# Patient Record
Sex: Male | Born: 1950 | Race: White | Hispanic: No | Marital: Married | Smoking: Current every day smoker
Health system: Southern US, Community
[De-identification: ages and names within clinical notes are randomized; demographics above are authoritative.]

## PROBLEM LIST (undated history)

## (undated) ENCOUNTER — Emergency Department (HOSPITAL_COMMUNITY): Payer: Medicare Other | Source: Home / Self Care

## (undated) DIAGNOSIS — K529 Noninfective gastroenteritis and colitis, unspecified: Secondary | ICD-10-CM

## (undated) DIAGNOSIS — F329 Major depressive disorder, single episode, unspecified: Secondary | ICD-10-CM

## (undated) DIAGNOSIS — N343 Urethral syndrome, unspecified: Secondary | ICD-10-CM

## (undated) DIAGNOSIS — N329 Bladder disorder, unspecified: Secondary | ICD-10-CM

## (undated) DIAGNOSIS — K5900 Constipation, unspecified: Secondary | ICD-10-CM

## (undated) DIAGNOSIS — M1990 Unspecified osteoarthritis, unspecified site: Secondary | ICD-10-CM

## (undated) DIAGNOSIS — F32A Depression, unspecified: Secondary | ICD-10-CM

## (undated) DIAGNOSIS — R0602 Shortness of breath: Secondary | ICD-10-CM

## (undated) DIAGNOSIS — N200 Calculus of kidney: Secondary | ICD-10-CM

## (undated) DIAGNOSIS — J449 Chronic obstructive pulmonary disease, unspecified: Secondary | ICD-10-CM

## (undated) DIAGNOSIS — K219 Gastro-esophageal reflux disease without esophagitis: Secondary | ICD-10-CM

## (undated) DIAGNOSIS — N4289 Other specified disorders of prostate: Secondary | ICD-10-CM

## (undated) DIAGNOSIS — F419 Anxiety disorder, unspecified: Secondary | ICD-10-CM

## (undated) DIAGNOSIS — C801 Malignant (primary) neoplasm, unspecified: Secondary | ICD-10-CM

## (undated) HISTORY — DX: Chronic obstructive pulmonary disease, unspecified: J44.9

## (undated) HISTORY — DX: Noninfective gastroenteritis and colitis, unspecified: K52.9

## (undated) HISTORY — DX: Constipation, unspecified: K59.00

## (undated) HISTORY — PX: CARPAL TUNNEL RELEASE: SHX101

## (undated) HISTORY — PX: BACK SURGERY: SHX140

## (undated) HISTORY — PX: OTHER SURGICAL HISTORY: SHX169

---

## 2000-03-08 ENCOUNTER — Ambulatory Visit (HOSPITAL_COMMUNITY): Admission: RE | Admit: 2000-03-08 | Discharge: 2000-03-08 | Payer: Self-pay | Admitting: Orthopedic Surgery

## 2000-03-08 ENCOUNTER — Encounter: Payer: Self-pay | Admitting: Orthopedic Surgery

## 2000-12-07 ENCOUNTER — Encounter (HOSPITAL_COMMUNITY): Admission: RE | Admit: 2000-12-07 | Discharge: 2001-01-06 | Payer: Self-pay | Admitting: Preventative Medicine

## 2000-12-23 ENCOUNTER — Encounter: Payer: Self-pay | Admitting: Family Medicine

## 2000-12-23 ENCOUNTER — Ambulatory Visit (HOSPITAL_COMMUNITY): Admission: RE | Admit: 2000-12-23 | Discharge: 2000-12-23 | Payer: Self-pay | Admitting: Family Medicine

## 2001-02-21 ENCOUNTER — Ambulatory Visit (HOSPITAL_COMMUNITY): Admission: RE | Admit: 2001-02-21 | Discharge: 2001-02-21 | Payer: Self-pay | Admitting: Internal Medicine

## 2001-03-01 ENCOUNTER — Ambulatory Visit (HOSPITAL_COMMUNITY): Admission: RE | Admit: 2001-03-01 | Discharge: 2001-03-01 | Payer: Self-pay | Admitting: Orthopaedic Surgery

## 2002-11-17 ENCOUNTER — Ambulatory Visit (HOSPITAL_COMMUNITY): Admission: RE | Admit: 2002-11-17 | Discharge: 2002-11-17 | Payer: Self-pay | Admitting: Family Medicine

## 2002-11-17 ENCOUNTER — Encounter: Payer: Self-pay | Admitting: Family Medicine

## 2002-12-15 ENCOUNTER — Ambulatory Visit (HOSPITAL_COMMUNITY): Admission: RE | Admit: 2002-12-15 | Discharge: 2002-12-15 | Payer: Self-pay | Admitting: Internal Medicine

## 2003-02-07 ENCOUNTER — Ambulatory Visit (HOSPITAL_COMMUNITY): Admission: RE | Admit: 2003-02-07 | Discharge: 2003-02-07 | Payer: Self-pay | Admitting: Internal Medicine

## 2003-10-17 ENCOUNTER — Ambulatory Visit (HOSPITAL_COMMUNITY): Admission: RE | Admit: 2003-10-17 | Discharge: 2003-10-17 | Payer: Self-pay | Admitting: Family Medicine

## 2004-02-05 ENCOUNTER — Ambulatory Visit (HOSPITAL_COMMUNITY): Admission: RE | Admit: 2004-02-05 | Discharge: 2004-02-05 | Payer: Self-pay | Admitting: Family Medicine

## 2004-03-21 ENCOUNTER — Ambulatory Visit: Payer: Self-pay | Admitting: Internal Medicine

## 2004-03-21 ENCOUNTER — Ambulatory Visit (HOSPITAL_COMMUNITY): Admission: RE | Admit: 2004-03-21 | Discharge: 2004-03-21 | Payer: Self-pay | Admitting: Internal Medicine

## 2004-05-28 ENCOUNTER — Ambulatory Visit (HOSPITAL_COMMUNITY): Admission: RE | Admit: 2004-05-28 | Discharge: 2004-05-28 | Payer: Self-pay | Admitting: Family Medicine

## 2004-06-03 ENCOUNTER — Ambulatory Visit (HOSPITAL_COMMUNITY): Admission: RE | Admit: 2004-06-03 | Discharge: 2004-06-03 | Payer: Self-pay | Admitting: Family Medicine

## 2004-12-18 ENCOUNTER — Ambulatory Visit (HOSPITAL_COMMUNITY): Admission: RE | Admit: 2004-12-18 | Discharge: 2004-12-18 | Payer: Self-pay | Admitting: Urology

## 2004-12-18 ENCOUNTER — Encounter (INDEPENDENT_AMBULATORY_CARE_PROVIDER_SITE_OTHER): Payer: Self-pay | Admitting: Urology

## 2005-09-04 ENCOUNTER — Ambulatory Visit (HOSPITAL_COMMUNITY): Payer: Self-pay | Admitting: Psychology

## 2005-09-11 ENCOUNTER — Ambulatory Visit (HOSPITAL_COMMUNITY): Payer: Self-pay | Admitting: Psychology

## 2006-04-30 ENCOUNTER — Ambulatory Visit (HOSPITAL_COMMUNITY): Admission: RE | Admit: 2006-04-30 | Discharge: 2006-04-30 | Payer: Self-pay | Admitting: Family Medicine

## 2007-11-23 ENCOUNTER — Ambulatory Visit (HOSPITAL_COMMUNITY): Admission: RE | Admit: 2007-11-23 | Discharge: 2007-11-23 | Payer: Self-pay | Admitting: Pulmonary Disease

## 2007-12-09 ENCOUNTER — Ambulatory Visit: Payer: Self-pay | Admitting: Internal Medicine

## 2007-12-23 ENCOUNTER — Encounter: Payer: Self-pay | Admitting: Internal Medicine

## 2007-12-23 ENCOUNTER — Ambulatory Visit: Payer: Self-pay | Admitting: Internal Medicine

## 2007-12-23 ENCOUNTER — Ambulatory Visit (HOSPITAL_COMMUNITY): Admission: RE | Admit: 2007-12-23 | Discharge: 2007-12-23 | Payer: Self-pay | Admitting: Internal Medicine

## 2008-04-10 ENCOUNTER — Ambulatory Visit (HOSPITAL_COMMUNITY): Admission: RE | Admit: 2008-04-10 | Discharge: 2008-04-10 | Payer: Self-pay | Admitting: Pulmonary Disease

## 2009-06-06 ENCOUNTER — Ambulatory Visit (HOSPITAL_COMMUNITY): Admission: RE | Admit: 2009-06-06 | Discharge: 2009-06-06 | Payer: Self-pay | Admitting: Pulmonary Disease

## 2009-08-26 ENCOUNTER — Ambulatory Visit (HOSPITAL_COMMUNITY): Admission: RE | Admit: 2009-08-26 | Discharge: 2009-08-26 | Payer: Self-pay | Admitting: General Surgery

## 2009-09-26 ENCOUNTER — Ambulatory Visit (HOSPITAL_COMMUNITY): Admission: RE | Admit: 2009-09-26 | Discharge: 2009-09-26 | Payer: Self-pay | Admitting: Internal Medicine

## 2009-11-18 ENCOUNTER — Ambulatory Visit: Payer: Self-pay | Admitting: Internal Medicine

## 2009-11-19 ENCOUNTER — Ambulatory Visit (HOSPITAL_COMMUNITY): Admission: RE | Admit: 2009-11-19 | Discharge: 2009-11-19 | Payer: Self-pay | Admitting: Pulmonary Disease

## 2009-11-26 ENCOUNTER — Ambulatory Visit (HOSPITAL_COMMUNITY): Admission: RE | Admit: 2009-11-26 | Discharge: 2009-11-26 | Payer: Self-pay | Admitting: Internal Medicine

## 2009-11-26 ENCOUNTER — Ambulatory Visit: Payer: Self-pay | Admitting: Internal Medicine

## 2009-12-24 ENCOUNTER — Ambulatory Visit: Payer: Self-pay | Admitting: Internal Medicine

## 2009-12-24 ENCOUNTER — Ambulatory Visit (HOSPITAL_COMMUNITY): Admission: RE | Admit: 2009-12-24 | Discharge: 2009-12-24 | Payer: Self-pay | Admitting: Internal Medicine

## 2010-02-18 ENCOUNTER — Ambulatory Visit: Admit: 2010-02-18 | Payer: Self-pay | Admitting: Internal Medicine

## 2010-03-01 ENCOUNTER — Encounter (INDEPENDENT_AMBULATORY_CARE_PROVIDER_SITE_OTHER): Payer: Self-pay | Admitting: Internal Medicine

## 2010-03-02 ENCOUNTER — Encounter: Payer: Self-pay | Admitting: Pulmonary Disease

## 2010-04-23 LAB — CREATININE, SERUM: GFR calc Af Amer: >60 mL/min (ref >60)

## 2010-04-27 LAB — BASIC METABOLIC PANEL
CO2: 29 mEq/L (ref 19–32)
Calcium: 9.5 mg/dL (ref 8.4–10.5)
Glucose, Bld: 106 mg/dL — ABNORMAL HIGH (ref 70–99)
Potassium: 4.3 mEq/L (ref 3.5–5.1)
Sodium: 139 mEq/L (ref 135–145)

## 2010-04-27 LAB — CBC
HCT: 51 % (ref 39.0–52.0)
Hemoglobin: 17.2 g/dL — ABNORMAL HIGH (ref 13.0–17.0)
MCH: 30.7 pg (ref 26.0–34.0)
MCHC: 33.7 g/dL (ref 30.0–36.0)

## 2010-06-24 NOTE — Op Note (Signed)
NAME:  Brian Sims, Brian Sims               ACCOUNT NO.:  [id number]   MEDICAL RECORD NO.:  [id number]          PATIENT TYPE:  AMB   LOCATION:  DAY                           FACILITY:  [facility]   PHYSICIAN:  R. Roetta Sessions, M.D. DATE OF BIRTH:  [birth year]   DATE OF PROCEDURE:  12/23/2007  DATE OF DISCHARGE:                               OPERATIVE REPORT   ILEOCOLONOSCOPY WITH SNARE POLYPECTOMY BIOPSY   INDICATIONS FOR PROCEDURE:  A 60 year old gentleman with long-standing  gastroesophageal reflux disease, well controlled by omeprazole 20 mg  orally b.i.d., history of multiple colonic polyps, recent intermittent  chronic postprandial abdominal cramps, and diarrhea.  I saw him also on  December 09, 2007, and  I started him on probiotic (Digestive Advantage)  with associated marked improvement in the above-mentioned lower GI tract  symptoms.  Mr. Mitchelle has desired colonoscopy given family history of  colon cancer and his personal history of polyps now.  Risks, benefits,  alternatives, and limitations of this approach have been reviewed.  Questions answered.  He is agreeable.  Please see documentation in the  medical record.   PROCEDURE NOTE:  O2 saturation, blood pressure, pulse, and respirations  were monitored throughout the entire procedure.   CONSCIOUS SEDATION:  Versed 6 mg IV and Demerol 125 mg IV in divided  doses.   INSTRUMENT:  Pentax video chip system.   FINDINGS:  Digital rectal exam revealed no abnormalities.  Endoscopic  findings:  Prep was adequate.  Colon:  Colonic mucosa was surveyed from  the rectosigmoid junction through the left transverse, right colon,  appendiceal orifice, ileocecal valve, and cecum.  These structures were  well seen and photographed for the record.  Terminal ileum was intubated  to 10 cm.  From this level, the scope was slowly and cautiously  withdrawn.  All previously mentioned mucosal surfaces were again seen.  The patient had two 6-mm  adenomatous-appearing polyps, one at the  hepatic flexure and one at the splenic flexure, both cold snared.  There  also had remained patchy areas of submucosal petechiae in the sigmoid  colon of uncertain significance.  Otherwise, there was no evidence of  colitis or neoplasm.  The terminal ileal mucosa appeared normal.  Scope  was pulled down the rectum.  Thorough examination of the rectal mucosa  including retroflex and anal verge demonstrated no abnormalities.  The 2  polyps were removed with cold snare technique and recovered.  Biopsies  of the areas of petechial hemorrhage and sigmoid were taken for  histologic study.  The patient tolerated the procedure well and was  reacted in Endoscopy.   IMPRESSION:  1. Normal rectum.  2. Colonic polyps resected with snare as described above.  The area of      sigmoid abnormality of uncertain/doubtful clinical significance      status post biopsy of the colonic mucosa appeared normal terminal      ileum.   RECOMMENDATIONS:  1. Continue probiotic (Digestive Advantage) daily.  This seems to be      helping Mr. Wojtkiewicz tremendously.  2. Followup on path.  3.  Further recommendations to follow.      Jonathon Bellows, M.D.  Electronically Signed     RMR/MEDQ  D:  12/23/2007  T:  12/24/2007  Job:  [id number]   cc:   Ramon Dredge L. Juanetta Gosling, M.D.  Fax: [phone]

## 2010-06-24 NOTE — H&P (Signed)
NAME:  Brian Sims, Brian Sims               ACCOUNT NO.:  [id number]   MEDICAL RECORD NO.:  [id number]          PATIENT TYPE:  AMB   LOCATION:  DAY                           FACILITY:  [facility]   PHYSICIAN:  R. Roetta Sessions, M.D. DATE OF BIRTH:  1950-05-24   DATE OF ADMISSION:  DATE OF DISCHARGE:                              HISTORY & PHYSICAL   CHIEF COMPLAINT:  Intermittent abdominal cramps, diarrhea,  gastroesophageal reflux disease, positive family history of colon cancer  at a young age and personal history of colonic polyps.   Brian Sims is a very pleasant 60 year old gentleman followed now  primarily by Dr. Shaune Pollack who comes to see me after a lengthy  absence  from the practice, formally saw Dr. Loreta Ave.  He has a long history of  intermittent abdominal cramps, which may last a day or two with  punctuated periods of nonbloody diarrhea.  There are long periods of  time where he is relatively asymptomatic, has relatively normal bowel  movements.  No abdominal pain.  He has gained about 25 pounds in the  past 5 years.  He has had multiple colonoscopies.  It appears that he  has had multiple adenomatous polyps found on day of colonoscopy through  the last one done in February 2006.  At that time, he did not have a  family history of colon cancer; however, after his last colonoscopy, his  brother was diagnosed with metastatic colon cancer in his early 38s and  has come to the disease.  He has had a small-bowel follow-through in our  prior EGD, which demonstrated erosive reflux esophagitis.  There is no  family history of inflammatory bowel disease or celiac disease.  He has  not been screened for celiac disease.  He is interested in having his  colon rechecked at this time.  His reflux symptoms are well controlled  on omeprazole 20 mg orally b.i.d.  He denies melena or rectal bleeding.   PAST MEDICAL HISTORY:  Significant for irritable bowel syndrome, hiatal  hernia, anxiety,  depression,  gastroesophageal reflux disease.   SURGERIES:  Two back, left knee, left arm, right hand, carpal tunnel.   CURRENT MEDICATIONS:  1. Omeprazole 20 mg orally b.i.d.  2. Symax SR 0.375 mg b.i.d.  3. Alprazolam 0.5 mg t.i.d.  4. __________ 150 mg half a tablet daily.   ALLERGIES:  No known drug allergies.   FAMILY HISTORY:  Positive for colon cancer in his brother in his early  66s, otherwise no chronic GI or liver illness, although there is a  sketchy history in one sister with liver cancer.   SOCIAL HISTORY:  The patient is married.  He works for [employer].  He smokes one pack of cigarettes per day.  No alcohol.  No  illicit drugs.   REVIEW OF SYSTEMS:  No chest pain, no dyspnea on exertion.  Weight gain  as outlined above.  No fever, chills, or night sweats.  No odynophagia,  no dysphagia.  No nausea or vomiting.   PHYSICAL EXAMINATION:  GENERAL:  A pleasant 60 year old gentleman  resting  comfortably.  VITAL SIGNS:  Weight 169, height 5 feet 9 inches, temp 95, BP 122/82,  pulse 80.  SKIN:  Warm and dry.  HEENT:  No scleral icterus.  Conjunctivae pink.  CHEST:  Lungs are clear to auscultation.  CARDIAC:  Regular rate and rhythm without murmur, gallop, or rub.  ABDOMEN:  Nondistended.  Positive bowel sounds.  Soft, nontender.  No  appreciable mass or organomegaly.  EXTREMITIES:  No edema.  RECTAL:  Deferred at the time of colonoscopy.   IMPRESSION:  Brian Sims is a pleasant 60 year old gentleman with  longstanding gastroesophageal reflux disease.  His symptoms are well  controlled on omeprazole 20 mg orally b.i.d.   He has a history of multiple recurrent adenomatous colonic polyps and  now has a positive family history of colon cancer in first-degree  relative at a young age.  He has chronic postprandial abdominal cramps  and intermittent diarrhea most consistent with diarrhea predominant  irritable bowel syndrome.   He has become concerned about his  prior history of polyps and his  positive family history of colon cancer.  He is desirous of a  colonoscopy.  Even with his family history, would not be unreasonable to  wait 5 years before proceeding with surveillance; however, it has been  almost 4 years and he is anxious about having another examination.  This  is not unreasonable to go ahead and offer him a colonoscopy in the near  future.  I think we have to go ahead and also screen him for celiac  disease with an antibody panel.  In addition, I would like to try him on  a probiotic such as digestive advantage for IBSD to see if this has any  impact in suppressing his intermittent symptoms.  I have discussed the  approach of colonoscopy with Brian Sims.  Risks, benefits, alternatives,  and limitations have been reviewed, questions answered.  He is  agreeable.  We will set up a colonoscopy at his convenience.  He is  going to check his work schedule let us know.  We will give him samples  of the above-mentioned probiotic through the office today and further  recommendations to follow in the very near future.      Jonathon Bellows, M.D.  Electronically Signed     RMR/MEDQ  D:  12/09/2007  T:  12/09/2007  Job:  [id number]   cc:   Ramon Dredge L. Juanetta Gosling, M.D.  Fax: [phone]

## 2010-06-27 NOTE — H&P (Signed)
NAME:  Brian Sims, MCMAHILL               ACCOUNT NO.:  [id number]   MEDICAL RECORD NO.:  [id number]          PATIENT TYPE:  AMB   LOCATION:  DAY                           FACILITY:  [facility]   PHYSICIAN:  Dennie Maizes, M.D.   DATE OF BIRTH:  30-Aug-1950   DATE OF ADMISSION:  DATE OF DISCHARGE:  LH                                HISTORY & PHYSICAL   CHIEF COMPLAINT:  Pain and swelling of the right side of the scrotum.   HISTORY OF PRESENT ILLNESS:  This 60 year old male was referred to me by Dr.  Lilyan Punt. He complains of painful swelling over the right hemi scrotum  for the past few weeks. He has had a swelling of the right side of the  scrotum for two years. He has intermittent pain associated with increased  swelling of the scrotum for the past two years. After antibiotic therapy, he  has relief of pain and swelling of the scrotum. He denied having any fevers,  chills, or voiding difficulty. He has urinary frequency x5 to 6 and nocturia  x3 to 4. He also has some urinary urgency and occasional urge urinary  incontinence.   He was recently seen by Dr. Lilyan Punt and treated with doxycycline and  oxycodone for the swelling and pain. He has reduction in the size of the  swelling as well as pain relief at present. There is no past history of  gross hematuria or dysuria or voiding difficulty or urolithiasis. He has not  had any urinary tract infection. There is no past history of GU  disease.   PAST MEDICAL HISTORY:  1.  History of irritable bowel syndrome.  2.  GERD.  3.  Status post right shoulder surgery, knee surgery, and back surgery.   MEDICATIONS:  Nexium 40 mg p.o. daily.   ALLERGIES:  None.   FAMILY HISTORY:  Positive for heart disease and diabetes mellitus. There is  also a family history of irritable bowel syndrome.   PHYSICAL EXAMINATION:  VITAL SIGNS:  Height 5 foot 9, weight 153 pounds.  HEENT:  Normal.  NECK:  No masses.  LUNGS:  Clear to auscultation.  HEART:   Regular rate and rhythm. No murmurs.  ABDOMEN:  Soft. No palpable flank masses or CVA tenderness. Bladder not  palpable. No suprapubic tenderness. Penis normal. Scrotum:  There is an  indurated mass in the right hemi scrotum on the posterior aspect measuring  about 5 cm x 6 cm in size. This is suggestive of an infected sebaceous cyst.  Testes are normal.  RECTAL:  A 40-g sized benign prostate.   IMPRESSION:  Sebaceous cyst of the scrotum with infection.   PLAN:  The patient has had antibiotic therapy with resolution of the  infection. He is scheduled to undergo excision of the sebaceous cyst of the  scrotum under anesthesia at [facility]. I have discussed with the  patient and his wife regarding the diagnosis, operative details, alternative  treatments, and possible risks and complications, and they have agreed for  the procedure to be done.  Dennie Maizes, M.D.  Electronically Signed     SK/MEDQ  D:  12/17/2004  T:  12/17/2004  Job:  [id number]   cc:   [facility]  Fax: [phone]   Scott A. Gerda Diss, MD  Fax: [phone]

## 2010-06-27 NOTE — Op Note (Signed)
NAME:  Brian Sims, Brian Sims               ACCOUNT NO.:  [id number]   MEDICAL RECORD NO.:  [id number]          PATIENT TYPE:  AMB   LOCATION:  DAY                           FACILITY:  [facility]   PHYSICIAN:  Dennie Maizes, M.D.   DATE OF BIRTH:  04/17/1950   DATE OF PROCEDURE:  12/18/2004  DATE OF DISCHARGE:                                 OPERATIVE REPORT   PREOPERATIVE DIAGNOSIS:  Sebaceous cyst of the scrotum with the infection.   POSTOPERATIVE DIAGNOSIS:  Sebaceous cyst of the scrotum with the infection.   OPERATIVE PROCEDURE:  Excision of cyst of scrotum (size 6 cm x 5 cm).   ANESTHESIA:  General.   SURGEON:  Dr. Rito Ehrlich.   COMPLICATIONS:  None.   ESTIMATED BLOOD LOSS:  Minimal.   DRAINS:  None.   SPECIMEN:  Scrotal cyst sent to pathology.   INDICATIONS FOR PROCEDURE:  This 60 year old male has swelling in the  scrotum for several years. He has had intermittent increase in size with  swelling and associated severe pain. Clinically, he had a large sebaceous  cyst of the scrotum with infection. After antibiotic therapy, he was brought  to the OR today for excision of cyst under anesthesia.   DESCRIPTION OF PROCEDURE:  General anesthesia was induced, and the patient  was placed on the OR table in the supine position. The legs were draped in a  frog-leg position. The lower abdomen and genitalia were prepped and draped  in a sterile fashion. Examination revealed a large sebaceous cyst of the  scrotum in the right inguinal area which measured about 6 x 5 cm in size. An  elliptical incision was made in the skin over the top of the swelling. By  sharp and blunt dissection, the cyst was excised from the scrotal bed. There  was a lot scar tissue due to previous infection. The cyst was then excised.  The scrotal wound then thoroughly examined and complete hemostasis  obtained by cauterization. The scrotal incision was then closed in two  layers using 3-0 Vicryl for subcutaneous tissues and  3-0 chromic gut for the  skin. The estimated blood loss was minimal. A scrotal dressing was applied,  and the patient was transferred to the PACU in a satisfactory condition.      Dennie Maizes, M.D.  Electronically Signed     SK/MEDQ  D:  12/18/2004  T:  12/18/2004  Job:  [id number]   cc:   Lorin Picket A. Gerda Diss, MD  Fax: [phone]

## 2010-06-27 NOTE — Op Note (Signed)
NAMESAHITH, NURSE               ACCOUNT NO.:  [id number]   MEDICAL RECORD NO.:  [id number]          PATIENT TYPE:  AMB   LOCATION:  DAY                           FACILITY:  [facility]   PHYSICIAN:  Lionel December, M.D.    DATE OF BIRTH:  [birth year]   DATE OF PROCEDURE:  03/21/2004  DATE OF DISCHARGE:                                 OPERATIVE REPORT   PROCEDURE:  Total colonoscopy.   INDICATION:  Samit is 60 year old Caucasian male who is here for  surveillance colonoscopy. He had colonoscopy 3 years ago. He had five small  polyps removed and all of these were edematous. He was therefore asked to  come back in 3 years. He remains free of symptoms, and his family history is  negative for colorectal carcinoma. Procedure risks were reviewed the  patient, and informed consent was obtained.   PREMEDICATION:  Demerol 50 mg IV, Versed 8 mg IV.   FINDINGS:  Procedure performed in endoscopy suite. The patient's vital signs  and O2 saturation were monitored during procedure and remained stable. The  patient was placed left lateral position and rectal examination performed.  No abnormality noted external or digital exam. Olympus video scope was  placed rectum and advanced under vision into sigmoid colon beyond.  Preparation was satisfactory. Scope was passed cecum which was identified by  appendiceal orifice and ileocecal valve. There was a small polyp hidden  behind ileocecal valve which was ablated via cold biopsy. There were two  more small polyps ablated in similar fashion; one was hepatic flexure and  one was just below that at proximal transverse colon. Two of these  endoscopically appeared to be edematous but not the one at the hepatic  flexure. Mucosa of the rest of the colon was normal. Rectal mucosa similarly  was normal. Scope was retroflexed to examine anorectal junction and small  hemorrhoids noted below the dentate line. Endoscope was straightened and  withdrawn. The patient  tolerated the procedure well.   FINAL DIAGNOSES:  1.  Three small polyps which were all ablated via cold biopsy. These were at      cecum, hepatic flexure and proximal transverse colon.  2.  Small external hemorrhoids.   RECOMMENDATIONS:  Standard instructions given. I will be contacting the  patient with biopsy results and may plan to bring him back and 5 years for  another colonoscopy.      NR/MEDQ  D:  03/21/2004  T:  03/21/2004  Job:  [id number]   cc:   Lorin Picket A. Gerda Diss, MD  [address], [address]  [place]  [place] [address]  Fax: [phone]

## 2010-06-27 NOTE — Op Note (Signed)
NAME:  Brian Sims, Brian Sims                         ACCOUNT NO.:  [id number]   MEDICAL RECORD NO.:  [id number]                   PATIENT TYPE:  AMB   LOCATION:  DAY                                  FACILITY:  [facility]   PHYSICIAN:  Lionel December, M.D.                 DATE OF BIRTH:  1950-05-30   DATE OF PROCEDURE:  12/15/2002  DATE OF DISCHARGE:                                 OPERATIVE REPORT   PROCEDURE:  Esophagogastroduodenoscopy.   ENDOSCOPIST:  Lionel December, M.D.   INDICATIONS FOR PROCEDURE:  [name] is a 60 year old Caucasian male who has  chronic GERD, IBS.  He has been having upper and lower abdominal pain. He  also has lost some weight recently. He had an abdominopelvic CT which showed  two tiny poorly characterized lesion in the liver and thickening to distal  esophagus. He is therefore undergoing diagnostic EGD. The procedure is  reviewed with the patient and informed consent was obtained.   PREMEDICATIONS:  Cetacaine spray for pharyngeal topical anesthesia, Demerol  50 mg IV, Versed 10 mg IV in divided doses.   DESCRIPTION OF PROCEDURE:  The procedure was performed in the endoscopy  suite. The patient's vital signs and O2 saturation were monitored during the  procedure and remained stable. The patient was placed in the left lateral  decubitus position and the Olympus video scope was passed via the oropharynx  without any difficulty into the esophagus.   ESOPHAGUS:  The mucosa of the esophagus was normal. There was erythema at  the GE junction. There was no ring, stricture or mass noted.   STOMACH:  It was empty and distended very well with insufflation. Folds of  the proximal stomach were normal. Examination of the mucosa revealed linear  streaks of erythema at the antrum and swollen prepyloric mucosa. No erosions  or ulcers noted. The pyloric channel was patent. The angularis, fundus and  cardia were examined by retroflexing the scope and were normal.   DUODENUM:   Examination of the bulb revealed a 5 mm polyp that was on the  anterior wall which was ablated via cold biopsy. The folds and mucosa in the  second part of the duodenum were normal. The endoscope was withdrawn.   The patient tolerated the procedure well.   FINAL DIAGNOSES:  1. 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  2. Nonerosive antral gastritis.  3. Single 5 mm polyp at the duodenal bulb which was ablated via cold biopsy.   RECOMMENDATIONS:  H. pylori serology will be checked today. I will be  contacting the patient with the result of blood test and biopsy. In the  meantime, he will continue his Levbid as before. He can take loperamide 2 mg  b.i.d. p.r.n. for diarrhea.      ___________________________________________  Lionel December, M.D.   NR/MEDQ  D:  12/15/2002  T:  12/15/2002  Job:  [id number]   cc:   Lorin Picket A. Gerda Diss, M.D.  [address], [address]  [place]  [place] [address]  Fax: [phone]

## 2010-06-27 NOTE — H&P (Signed)
NAME:  Brian Sims, Brian Sims                         ACCOUNT NO.:  [id number]   MEDICAL RECORD NO.:  [id number]                   PATIENT TYPE:   LOCATION:                                       FACILITY:  [facility]   PHYSICIAN:  Lionel December, M.D.                 DATE OF BIRTH:  Jan 25, 1951   DATE OF ADMISSION:  DATE OF DISCHARGE:                                HISTORY & PHYSICAL   REFERRING PHYSICIAN:  Scott A. Luking, M.D.   CHIEF COMPLAINT:  Bilateral lower quadrant abdominal pain and abnormal  abdominal CT.   HISTORY OF PRESENT ILLNESS:  This patient is a 60 year old Caucasian male  who presents to our office for reevaluation of persistent, intermittent,  bilateral lower quadrant abdominal cramping and pain which he has had for  several years.  He was last seen by Dr. Karilyn Cota on February 21, 2001 for  colonoscopy.  Five polyps were snared from the colon, 3 from the cecum, and  1 from the hepatic flexure, and 1 from the rectum.  Each of these polyps  came back with adenomatous changes; and, therefore, Dr. Karilyn Cota recommended a  3-year follow up colonoscopy as he does have a history of a brother  diagnosed with colon cancer at age 80.  He was instructed to continue his  Levbid as well as fiber supplementation for what was presumed to be  irritable bowel syndrome.  He reports that he has been taking hyoscyamine  0..375 mg b.i.d. with fairly good results and abdominal pain is relieved  post defecation.   He recently was seen by Dr. Lilyan Punt on November 17, 2002; and CT of the  abdomen and pelvis was obtained.  There were 2 tiny, well-defined, low-  density lesions in the left liver which were too small to characterize.  Also incidental imaging of the lower chest revealed circumferential wall  thickening of the distal esophagus as well as some diverticulosis in the  sigmoid colon.  The patient reports that he has had history of frequent GERD  and is currently on Nexium 40 mg daily.  He has  had problems with heartburn  and indigestion for approximately 4-5 years now.  He does report that he is  having break-through pain on the Nexium as well. He did have EGD in 1981 by  Dr. Karilyn Cota which was normal.  He denies any vomiting, although he does  report occasional nausea.  Reflux in the past has been well-controlled;  however, over the past 2-3 months he reports that he is having more problems  with it.  He denies any history of peptic ulcer disease.  He denies any  aspirin or NSAID use.   PAST MEDICAL HISTORY:  Anxiety, GERD, IBS, adenomatous colon polyps.   PAST SURGICAL HISTORY:  Two back surgeries, left shoulder arthroscopy, and  left knee arthroscopy.   CURRENT MEDICATIONS:  1. Nexium 40 mg daily.  2. Hyoscyamine  0.375 mg b.i.d.  3. Xanax 0.5 mg t.i.d. p.r.n. anxiety.  4. Lortab 500 mg 1-2 tablets daily as needed.   ALLERGIES:  No known drug allergies.   FAMILY HISTORY:  Positive for 1 brother diagnosed with colon cancer at age  62.  Also positive for family history of CAD.   SOCIAL HISTORY:  He has been married for 6 years and has 1 healthy grandson.  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  He  currently smokes 1-1/2 packs per day for the last 34 years.  He does report  history of heavy alcohol use for several years; however, he quite  approximately 10 years ago.  He denies any drug use.   REVIEW OF SYSTEMS:  CONSTITUTIONAL:  Weight--he reports a 10-pound weight  loss in the last 3-5 months.  He reports occasional chills.  He denies any  fever.  He does report decreased appetite secondary to the pain.  CARDIOVASCULAR:  He denies any chest pain or palpitations.  PULMONARY:  He  denies any shortness of breath, cough, or dyspnea.  GASTROINTESTINAL:  See  HPI.  SKIN:  He denies any rash or jaundice.   PHYSICAL EXAMINATION:  VITAL SIGNS:  Weight 156 pounds.  Height 69 inches.  Temperature 97.8, blood pressure 130/70, pulse 74.  GENERAL:   This patient is a 60 year old Caucasian male who is well  developed, well nourished, in no acute distress.  He is alert, oriented,  cooperative, and pleasant.  HEENT:  Sclerae are clear.  Nonicteric.  Conjunctivae pink. Oropharynx pink  and moist without any lesions.  NECK:  Supple without any masses or thyromegaly.  HEART:  Regular rate and rhythm without murmurs, clicks, rubs, or gallops.  LUNGS:  He does have decreased breath sounds with emphysematous changes.  No  audible wheezing or rhonchi.  ABDOMEN:  Positive bowel sounds x4.  Soft, nondistended.  He does have mild  epigastric tenderness on palpation.  No palpable hepatosplenomegaly.  No  palpable masses.  EXTREMITIES:  2+ peripheral pulses bilaterally.  No pedal edema.  SKIN:  Pink, warm and dry, without any rash or jaundice.   LABORATORY STUDIES:  From November 10, 2002 at Dr. Fletcher Anon office:  WBC 9.4,  hemoglobin 16.1, hematocrit 47.6, platelets 215.  Sodium 142, potassium 4.3,  chloride 106, CO2 25, glucose 90, BUN 9, creatinine 1.1, calcium 9.5.  Total  bilirubin 0.4, alkaline phosphatase 7.8, AST 13, ALT 10, total protein 6.4,  and albumin 4.4, amylase 35, and PSA was 0.48; all of which were normal.  CT  of abdomen and pelvis: See HPI.   ASSESSMENT:  This patient is a 60 year old Caucasian male with two concerns  today.  1. His intractable gastroesophageal reflux disease associated with abnormal     finding on CT scan which includes circumferential wall thickening of the     distal esophagus.  Given his history of intractable gastroesophageal     reflux disease as well as these findings we probably should proceed     further with esophagogastroduodenoscopy by Dr. Karilyn Cota to rule out any     esophagitis or changes of Barrett's esophagus.  I discussed this     procedure with the patient as well as risks and benefits which include,    but are not limited to, bleeding, perforation, and infection.  He agrees     with this plan  and consent will be obtained.  2. Bilateral lower quadrant abdominal  pain:  He has longstanding history of     this and I feel that his pain is most likely associated with irritable     bowel syndrome.  Given that hyoscyamine provides some relief he can     continue this and use it before every meal if he needs to.  Also,     recommended increasing the fiber in his diet and we will give him some     literature on irritable bowel syndrome to read over today.  3. History of adenomatous polyps and positive family history of colon     carcinoma:  Mr. Cerveny is due for repeat colonoscopy in January of 2006     per Dr. Patty Sermons recommendations.   RECOMMENDATIONS:  1. In January 2006 colonoscopy.  2. IBS literature given.  3. Recommend fiber of choice 2 tablets daily.  4. We will schedule EGD with Dr. Karilyn Cota at [facility].  5.     Further recommendations pending following procedure.  6. Continue Nexium 40 mg daily.   We would like to thank Dr. Lilyan Punt for this kind referral.     _____________________________________  ___________________________________________  Nicholas Lose, N.P.               Lionel December, M.D.   KC/MEDQ  D:  12/08/2002  T:  12/08/2002  Job:  [id number]   cc:   Lionel December, M.D.  [address]    [place] [address]  Fax: [phone]   Scott A. Gerda Diss, M.D.  [address], [address]  [place]  [place] [address]  Fax: [phone]

## 2010-07-15 ENCOUNTER — Other Ambulatory Visit (HOSPITAL_COMMUNITY): Payer: Self-pay | Admitting: Pulmonary Disease

## 2010-07-15 DIAGNOSIS — J449 Chronic obstructive pulmonary disease, unspecified: Secondary | ICD-10-CM

## 2010-07-15 DIAGNOSIS — IMO0002 Reserved for concepts with insufficient information to code with codable children: Secondary | ICD-10-CM

## 2010-07-15 DIAGNOSIS — R634 Abnormal weight loss: Secondary | ICD-10-CM

## 2010-07-22 ENCOUNTER — Encounter (HOSPITAL_COMMUNITY): Payer: Self-pay

## 2010-07-22 ENCOUNTER — Ambulatory Visit (HOSPITAL_COMMUNITY)
Admission: RE | Admit: 2010-07-22 | Discharge: 2010-07-22 | Disposition: A | Discharge status: Home / Self Care | Payer: BC Managed Care – PPO | Source: Ambulatory Visit | Attending: Pulmonary Disease | Admitting: Pulmonary Disease

## 2010-07-22 DIAGNOSIS — R634 Abnormal weight loss: Secondary | ICD-10-CM

## 2010-07-22 DIAGNOSIS — K7689 Other specified diseases of liver: Secondary | ICD-10-CM | POA: Insufficient documentation

## 2010-07-22 DIAGNOSIS — IMO0002 Reserved for concepts with insufficient information to code with codable children: Secondary | ICD-10-CM

## 2010-07-22 DIAGNOSIS — J449 Chronic obstructive pulmonary disease, unspecified: Secondary | ICD-10-CM

## 2010-07-22 DIAGNOSIS — J4489 Other specified chronic obstructive pulmonary disease: Secondary | ICD-10-CM | POA: Insufficient documentation

## 2010-07-22 MED ORDER — IOHEXOL 300 MG/ML  SOLN
80.0000 mL | Freq: Once | INTRAMUSCULAR | Status: AC | PRN
  Administered 2010-07-22: 80 mL via INTRAVENOUS

## 2011-06-30 ENCOUNTER — Other Ambulatory Visit (HOSPITAL_COMMUNITY): Payer: Self-pay | Admitting: Pulmonary Disease

## 2011-07-03 ENCOUNTER — Other Ambulatory Visit (HOSPITAL_COMMUNITY): Payer: Self-pay | Admitting: Pulmonary Disease

## 2011-07-03 ENCOUNTER — Ambulatory Visit (HOSPITAL_COMMUNITY)
Admission: RE | Admit: 2011-07-03 | Discharge: 2011-07-03 | Disposition: A | Discharge status: Home / Self Care | Payer: BC Managed Care – PPO | Source: Ambulatory Visit | Attending: Pulmonary Disease | Admitting: Pulmonary Disease

## 2011-07-03 DIAGNOSIS — R109 Unspecified abdominal pain: Secondary | ICD-10-CM | POA: Insufficient documentation

## 2011-07-03 DIAGNOSIS — I708 Atherosclerosis of other arteries: Secondary | ICD-10-CM | POA: Insufficient documentation

## 2011-07-03 DIAGNOSIS — N400 Enlarged prostate without lower urinary tract symptoms: Secondary | ICD-10-CM | POA: Insufficient documentation

## 2011-07-03 DIAGNOSIS — R110 Nausea: Secondary | ICD-10-CM | POA: Insufficient documentation

## 2011-07-03 MED ORDER — IOHEXOL 350 MG/ML SOLN
100.0000 mL | Freq: Once | INTRAVENOUS | Status: AC | PRN
  Administered 2011-07-03: 100 mL via INTRAVENOUS

## 2011-12-25 ENCOUNTER — Other Ambulatory Visit (HOSPITAL_COMMUNITY): Payer: Self-pay | Admitting: Pulmonary Disease

## 2011-12-25 DIAGNOSIS — R634 Abnormal weight loss: Secondary | ICD-10-CM

## 2011-12-25 DIAGNOSIS — R0602 Shortness of breath: Secondary | ICD-10-CM

## 2011-12-25 DIAGNOSIS — R109 Unspecified abdominal pain: Secondary | ICD-10-CM

## 2011-12-29 ENCOUNTER — Ambulatory Visit (HOSPITAL_COMMUNITY)
Admission: RE | Admit: 2011-12-29 | Discharge: 2011-12-29 | Disposition: A | Discharge status: Home / Self Care | Payer: BC Managed Care – PPO | Source: Ambulatory Visit | Attending: Pulmonary Disease | Admitting: Pulmonary Disease

## 2011-12-29 ENCOUNTER — Ambulatory Visit (HOSPITAL_COMMUNITY): Payer: BC Managed Care – PPO

## 2011-12-29 DIAGNOSIS — R634 Abnormal weight loss: Secondary | ICD-10-CM | POA: Insufficient documentation

## 2011-12-29 DIAGNOSIS — K7689 Other specified diseases of liver: Secondary | ICD-10-CM | POA: Insufficient documentation

## 2011-12-29 DIAGNOSIS — N281 Cyst of kidney, acquired: Secondary | ICD-10-CM | POA: Insufficient documentation

## 2011-12-29 DIAGNOSIS — R0602 Shortness of breath: Secondary | ICD-10-CM | POA: Insufficient documentation

## 2011-12-29 DIAGNOSIS — F17200 Nicotine dependence, unspecified, uncomplicated: Secondary | ICD-10-CM | POA: Insufficient documentation

## 2011-12-29 DIAGNOSIS — R109 Unspecified abdominal pain: Secondary | ICD-10-CM | POA: Insufficient documentation

## 2011-12-29 MED ORDER — IOHEXOL 300 MG/ML  SOLN
100.0000 mL | Freq: Once | INTRAMUSCULAR | Status: AC | PRN
  Administered 2011-12-29: 100 mL via INTRAVENOUS

## 2012-08-18 ENCOUNTER — Ambulatory Visit (INDEPENDENT_AMBULATORY_CARE_PROVIDER_SITE_OTHER): Payer: Medicare Other | Admitting: Otolaryngology

## 2012-08-18 DIAGNOSIS — H903 Sensorineural hearing loss, bilateral: Secondary | ICD-10-CM

## 2012-08-18 DIAGNOSIS — R42 Dizziness and giddiness: Secondary | ICD-10-CM

## 2012-12-21 ENCOUNTER — Encounter (INDEPENDENT_AMBULATORY_CARE_PROVIDER_SITE_OTHER): Payer: Self-pay | Admitting: Internal Medicine

## 2012-12-21 ENCOUNTER — Ambulatory Visit (INDEPENDENT_AMBULATORY_CARE_PROVIDER_SITE_OTHER): Payer: Medicare Other | Admitting: Internal Medicine

## 2012-12-21 ENCOUNTER — Telehealth (INDEPENDENT_AMBULATORY_CARE_PROVIDER_SITE_OTHER): Payer: Self-pay | Admitting: *Deleted

## 2012-12-21 ENCOUNTER — Other Ambulatory Visit (INDEPENDENT_AMBULATORY_CARE_PROVIDER_SITE_OTHER): Payer: Self-pay | Admitting: *Deleted

## 2012-12-21 VITALS — BP 94/60 | HR 88 | Temp 99.0°F | Ht 69.0 in | Wt 123.9 lb

## 2012-12-21 DIAGNOSIS — R195 Other fecal abnormalities: Secondary | ICD-10-CM | POA: Insufficient documentation

## 2012-12-21 DIAGNOSIS — M549 Dorsalgia, unspecified: Secondary | ICD-10-CM | POA: Insufficient documentation

## 2012-12-21 DIAGNOSIS — Z1211 Encounter for screening for malignant neoplasm of colon: Secondary | ICD-10-CM

## 2012-12-21 DIAGNOSIS — K589 Irritable bowel syndrome without diarrhea: Secondary | ICD-10-CM

## 2012-12-21 MED ORDER — PEG 3350-KCL-NA BICARB-NACL 420 G PO SOLR: 4000.0000 mL | Freq: Once | ORAL | Status: DC

## 2012-12-21 NOTE — Patient Instructions (Signed)
Colonoscopy with Dr. Rehman 

## 2012-12-21 NOTE — Telephone Encounter (Signed)
Patient needs trilyte 

## 2012-12-21 NOTE — Progress Notes (Signed)
Subjective:     Patient ID: Brian Sims, male   DOB: 11-09-1950, 62 y.o.   MRN: [id number]  HPI Referred to our office by Dr. Malvin Johns for blood in his stool.  He saw Dr. Malvin Johns for a cyst to his buttock. Noted to have guaiac positive stool. Appetite is not good. He has lost about 27 pounds since his last visit in 2011.  He has lower abdominal cramps. After he eats he will have upper abdominal cramps. He usually has a BM 1-2 a day with the Linzess. No dysphagia Stools are green-brown. Sometimes his stools may be black.   He says he has been seen at [facility] for IBS. He was told there was no cure for the disease.   12/25/2011 CT abdomen/pelvis with CM: weight loss: Small hepatic left renal cysts.  No acute intra-abdominal or intrapelvic abnormalities.   06/30/2011 CT angio abdomen/pelvis with and without: nausea and abdominal pain; 1. No significant proximal mesenteric arterial occlusive disease  to suggest an etiology of occlusive mesenteric ischemia.  2. No evidence of mesenteric venous thrombosis.  3. Aortoiliac extensive plaque with at least mild stenoses in the  left iliac system. Correlate with clinical symptomatology and  ABIs.  4. At least mild ostial stenosis of the middle left renal artery.    12/10/2009 EUS [facility]:( THickened esophagus on CT).  White flat esophageal plaques noted at 30cm. GE junction norma. Stomach and duodenum to D2 normal. The distal esophageal layers were normal. THE CBD and GB were normal. The ampulla was normal. The pancreas was normal. There was no lymphadenopathy Biopsy: Mild chronic esophagitis. No eosinophils 12/2009 Colonoscopy: Normal terminal ileum. No evidence of colitis. Four small polyps removed.  Biopsy:   1. Colon, polyp(s), transverse : TUBULAR ADENOMA.NO HIGH GRADE DYSPLASIA OR MALIGNANCY IDENTIFIED.  2. Colon, polyp(s), ascending : SERRATED ADENOMA.NO HIGH GRADE DYSPLASIA OR MALIGNANCY IDENTIFIED. 3. Colon, polyp(s), proximal  sigmoid : TUBULAR ADENOMA.NO HIGH GRADE DYSPLASIA OR MALIGNANCY IDENTIFIED. 4. Colon, polyp(s), mid sigmoid : TUBULAR ADENOMA.NO HIGH GRADE DYSPLASIA OR MALIGNANCY IDENTIFIED.     Review of Systems Current Outpatient Prescriptions  Medication Sig Dispense Refill  . ALPRAZolam (XANAX) 1 MG tablet Take 1 mg by mouth at bedtime as needed for anxiety.      . Linaclotide (LINZESS) 145 MCG CAPS capsule Take 145 mcg by mouth daily.      . mirtazapine (REMERON) 30 MG tablet Take 30 mg by mouth at bedtime.      Marland Kitchen omeprazole (PRILOSEC) 20 MG capsule Take 20 mg by mouth 2 (two) times daily before a meal.      . oxyCODONE-acetaminophen (PERCOCET) 10-325 MG per tablet Take 1 tablet by mouth every 4 (four) hours as needed for pain.      Marland Kitchen tiotropium (SPIRIVA) 18 MCG inhalation capsule Place 18 mcg into inhaler and inhale daily.      . traZODone (DESYREL) 150 MG tablet Take by mouth at bedtime.       No current facility-administered medications for this visit.       Objective:   Physical Exam  Filed Vitals:   12/21/12 1034  BP: 94/60  Pulse: 88  Temp: 99 F (37.2 C)  Height: 5\' 9"  (1.753 m)  Weight: 123 lb 14.4 oz (56.201 kg)   Alert and oriented. Skin warm and dry. Oral mucosa is moist.   . Sclera anicteric, conjunctivae is pink. Thyroid not enlarged. No cervical lymphadenopathy. Lungs clear. Heart regular rate and rhythm.  Abdomen is  soft. Bowel sounds are positive. No hepatomegaly. No abdominal masses felt. No tenderness.  No edema to lower extremities. Very frail.  Stool brown and guaiac negative.      Assessment:    Guaiac positive stool. Weight loss. Colonic neoplasm needs to be ruled. Chronic abdominal pain: IBS. Continue the Linzess.     Plan:    Colonoscopy.The risks and benefits such as perforation, bleeding, and infection were reviewed with the patient and is agreeable.

## 2012-12-26 ENCOUNTER — Telehealth (INDEPENDENT_AMBULATORY_CARE_PROVIDER_SITE_OTHER): Payer: Self-pay | Admitting: *Deleted

## 2012-12-26 NOTE — Telephone Encounter (Signed)
Spoke with patient. Thought I said he had MRSA. This is not the case. No documentation that he has MRSA.

## 2012-12-26 NOTE — Telephone Encounter (Signed)
Saw Brian Sims last on 12th and has questions.  Please call patient at [phone]

## 2012-12-30 ENCOUNTER — Encounter (HOSPITAL_COMMUNITY): Payer: Self-pay | Admitting: Pharmacy Technician

## 2013-01-11 ENCOUNTER — Encounter (HOSPITAL_COMMUNITY)
Admission: RE | Disposition: A | Discharge status: Home / Self Care | Payer: Self-pay | Source: Ambulatory Visit | Attending: Internal Medicine

## 2013-01-11 ENCOUNTER — Encounter (HOSPITAL_COMMUNITY): Payer: Self-pay | Admitting: *Deleted

## 2013-01-11 ENCOUNTER — Ambulatory Visit (HOSPITAL_COMMUNITY)
Admission: RE | Admit: 2013-01-11 | Discharge: 2013-01-11 | Disposition: A | Discharge status: Home / Self Care | Payer: Medicare Other | Source: Ambulatory Visit | Attending: Internal Medicine | Admitting: Internal Medicine

## 2013-01-11 DIAGNOSIS — J449 Chronic obstructive pulmonary disease, unspecified: Secondary | ICD-10-CM | POA: Insufficient documentation

## 2013-01-11 DIAGNOSIS — Z860100 Personal history of colon polyps, unspecified: Secondary | ICD-10-CM | POA: Insufficient documentation

## 2013-01-11 DIAGNOSIS — D126 Benign neoplasm of colon, unspecified: Secondary | ICD-10-CM

## 2013-01-11 DIAGNOSIS — R195 Other fecal abnormalities: Secondary | ICD-10-CM

## 2013-01-11 DIAGNOSIS — J4489 Other specified chronic obstructive pulmonary disease: Secondary | ICD-10-CM | POA: Insufficient documentation

## 2013-01-11 HISTORY — PX: COLONOSCOPY: SHX5424

## 2013-01-11 SURGERY — COLONOSCOPY
Anesthesia: Moderate Sedation

## 2013-01-11 MED ORDER — MIDAZOLAM HCL 5 MG/5ML IJ SOLN
INTRAMUSCULAR | Status: DC | PRN
  Administered 2013-01-11 (×6): 2 mg via INTRAVENOUS

## 2013-01-11 MED ORDER — MEPERIDINE HCL 50 MG/ML IJ SOLN
INTRAMUSCULAR | Status: DC | PRN
  Administered 2013-01-11 (×4): 25 mg via INTRAVENOUS

## 2013-01-11 MED ORDER — STERILE WATER FOR IRRIGATION IR SOLN
Status: DC | PRN
  Administered 2013-01-11: 17:00:00

## 2013-01-11 MED ORDER — SODIUM CHLORIDE 0.9 % IV SOLN
INTRAVENOUS | Status: DC
  Administered 2013-01-11: 16:00:00 via INTRAVENOUS

## 2013-01-11 MED ORDER — MIDAZOLAM HCL 2 MG/2ML IJ SOLN
INTRAMUSCULAR | Status: AC
  Filled 2013-01-11: qty 2

## 2013-01-11 MED ORDER — MIDAZOLAM HCL 5 MG/5ML IJ SOLN
INTRAMUSCULAR | Status: AC
  Filled 2013-01-11: qty 10

## 2013-01-11 MED ORDER — MIDAZOLAM HCL 5 MG/5ML IJ SOLN
INTRAMUSCULAR | Status: AC
  Filled 2013-01-11: qty 5

## 2013-01-11 MED ORDER — MEPERIDINE HCL 50 MG/ML IJ SOLN
INTRAMUSCULAR | Status: AC
  Filled 2013-01-11: qty 1

## 2013-01-11 NOTE — Op Note (Signed)
COLONOSCOPY PROCEDURE REPORT  PATIENT:  Brian Sims  MR#:  [id number] Birthdate:  12-04-1950, 62 y.o., male Endoscopist:  Dr. Malissa Hippo, MD Referred By:  Dr. Barbaraann Barthel, MD  Procedure Date: 01/11/2013  Procedure:   Colonoscopy with snare polypectomy.  Indications:  Patient is 62 year old Caucasian male with history of colonic adenomas is undergoing diagnostic colonoscopy because of heme-positive stools. His last colonoscopy was in November 2011 with removal of 3 tubular adenomas in 1 sessile serrated adenoma.  Informed Consent:  The procedure and risks were reviewed with the patient and informed consent was obtained.  Medications:  Demerol 100 mg IV Versed 12 mg IV  Description of procedure:  After a digital rectal exam was performed, that colonoscope was advanced from the anus through the rectum and colon to the area of the cecum, ileocecal valve and appendiceal orifice. The cecum was deeply intubated. These structures were well-seen and photographed for the record. From the level of the cecum and ileocecal valve, the scope was slowly and cautiously withdrawn. The mucosal surfaces were carefully surveyed utilizing scope tip to flexion to facilitate fold flattening as needed. The scope was pulled down into the rectum where a thorough exam including retroflexion was performed.  Findings:   Prep excellent. 4 mm polyp cold snare from ascending colon. Tiny residual piece to remove with cold biopsy forceps. 7 mm x 4 mm polyp snared from hepatic flexure in two pieces. Both of these polyps were submitted together. Mucosa rest of the colon and rectum was normal. Number as noted below the dentate line along small anal papillae.   Therapeutic/Diagnostic Maneuvers Performed:  See above  Complications:  None  Cecal Withdrawal Time:  13 minutes  Impression:  Examination performed to cecum. 4 mm polyp cold snare from ascending colon and submitted along with another small polyp  which was hot snared from hepatic flexure(7 x 4 mm). Small external hemorrhoids and anal papillae.  Recommendations:  Standard instructions given. I will contact patient with biopsy results and further recommendations.  REHMAN,NAJEEB U  01/11/2013 5:12 PM  CC: Dr. Fredirick Maudlin, MD & Dr. Bonnetta Barry ref. provider found

## 2013-01-11 NOTE — H&P (Signed)
Brian Sims is an 62 y.o. male.   Chief Complaint: Patient is here for colonoscopy. HPI: She 9-year-old Caucasian male with history of colonic adenomas was found to have heme-positive stool by Dr. Malvin Johns recently therefore sent over for colonoscopy. He has intermittent hematochezia felt to be secondary hemorrhoids he gently pass a small amount of bright red blood with bowel movements. His last colonoscopy was in November 2011 with removal of 4 polyps one was serrated adenoma and the others were tubular adenomas. He has irregular bowel movements. He goes from having normal stools diarrhea or constipation. He also has history of significant weight loss but this has leveled off. Family history is negative for CRC.  Past Medical History  Diagnosis Date  . COPD (chronic obstructive pulmonary disease)   . Inflammatory bowel disease   . Constipation     Past Surgical History  Procedure Laterality Date  . Back surgery    . Left shoulder      arthroscopy  . Rt knee arthroscopy      History reviewed. No pertinent family history. Social History:  reports that he has been smoking.  He does not have any smokeless tobacco history on file. He reports that he does not drink alcohol or use illicit drugs.  Allergies:  Allergies  Allergen Reactions  . Gabapentin   . Morphine And Related     Medications Prior to Admission  Medication Sig Dispense Refill  . ALPRAZolam (XANAX) 1 MG tablet Take 1 mg by mouth at bedtime as needed for anxiety.      . hydrocortisone (ANUSOL-HC) 2.5 % rectal cream Place 1 application rectally daily as needed for hemorrhoids or itching.      . Linaclotide (LINZESS) 145 MCG CAPS capsule Take 145 mcg by mouth daily.      . mirtazapine (REMERON) 30 MG tablet Take 30 mg by mouth at bedtime.      Marland Kitchen omeprazole (PRILOSEC) 20 MG capsule Take 20 mg by mouth 2 (two) times daily before a meal.      . oxyCODONE-acetaminophen (PERCOCET) 10-325 MG per tablet Take 1 tablet by mouth  every 4 (four) hours as needed for pain.      Marland Kitchen tiotropium (SPIRIVA) 18 MCG inhalation capsule Place 18 mcg into inhaler and inhale daily.      . traZODone (DESYREL) 150 MG tablet Take 150 mg by mouth at bedtime.         No results found for this or any previous visit (from the past 48 hour(s)). No results found.  ROS  Blood pressure 128/77, pulse 86, temperature 98.4 F (36.9 C), temperature source Oral, resp. rate 14, SpO2 99.00%. Physical Exam  Constitutional:  Well-developed thin Caucasian male in NAD  HENT:  Mouth/Throat: Oropharynx is clear and moist.  Eyes: Conjunctivae are normal. No scleral icterus.  Neck: No thyromegaly present.  Cardiovascular: Normal rate and regular rhythm.   No murmur heard. Respiratory: Effort normal and breath sounds normal.  GI: Soft. He exhibits no distension and no mass. There is no tenderness.  Musculoskeletal: He exhibits no edema.  Lymphadenopathy:    He has no cervical adenopathy.  Neurological: He is alert.  Skin: Skin is warm and dry.     Assessment/Plan History of colonic adenomas. Heme positive stool. Diagnostic colonoscopy.  Disha Cottam U 01/11/2013, 4:29 PM

## 2013-01-16 ENCOUNTER — Encounter (INDEPENDENT_AMBULATORY_CARE_PROVIDER_SITE_OTHER): Payer: Self-pay | Admitting: *Deleted

## 2013-01-17 ENCOUNTER — Encounter (HOSPITAL_COMMUNITY): Payer: Self-pay | Admitting: Internal Medicine

## 2013-02-09 DIAGNOSIS — C801 Malignant (primary) neoplasm, unspecified: Secondary | ICD-10-CM

## 2013-02-09 HISTORY — DX: Malignant (primary) neoplasm, unspecified: C80.1

## 2013-08-25 ENCOUNTER — Other Ambulatory Visit: Payer: Self-pay | Admitting: Urology

## 2013-08-25 ENCOUNTER — Ambulatory Visit (INDEPENDENT_AMBULATORY_CARE_PROVIDER_SITE_OTHER): Payer: Medicare Other | Admitting: Urology

## 2013-08-25 DIAGNOSIS — R82998 Other abnormal findings in urine: Secondary | ICD-10-CM

## 2013-08-25 DIAGNOSIS — R3129 Other microscopic hematuria: Secondary | ICD-10-CM

## 2013-08-25 DIAGNOSIS — N3941 Urge incontinence: Secondary | ICD-10-CM

## 2013-08-25 DIAGNOSIS — N138 Other obstructive and reflux uropathy: Secondary | ICD-10-CM

## 2013-08-25 DIAGNOSIS — N401 Enlarged prostate with lower urinary tract symptoms: Secondary | ICD-10-CM

## 2013-08-30 ENCOUNTER — Ambulatory Visit (HOSPITAL_COMMUNITY)
Admission: RE | Admit: 2013-08-30 | Discharge: 2013-08-30 | Disposition: A | Discharge status: Home / Self Care | Payer: Medicare Other | Source: Ambulatory Visit | Attending: Urology | Admitting: Urology

## 2013-08-30 DIAGNOSIS — R109 Unspecified abdominal pain: Secondary | ICD-10-CM | POA: Insufficient documentation

## 2013-08-30 DIAGNOSIS — R3129 Other microscopic hematuria: Secondary | ICD-10-CM | POA: Insufficient documentation

## 2013-08-30 DIAGNOSIS — N400 Enlarged prostate without lower urinary tract symptoms: Secondary | ICD-10-CM | POA: Insufficient documentation

## 2013-08-30 DIAGNOSIS — N200 Calculus of kidney: Secondary | ICD-10-CM | POA: Insufficient documentation

## 2013-08-30 DIAGNOSIS — N3289 Other specified disorders of bladder: Secondary | ICD-10-CM | POA: Insufficient documentation

## 2013-08-30 MED ORDER — SODIUM CHLORIDE 0.9 % IV SOLN
INTRAVENOUS | Status: AC
  Filled 2013-08-30: qty 250

## 2013-08-30 MED ORDER — IOHEXOL 300 MG/ML  SOLN
125.0000 mL | Freq: Once | INTRAMUSCULAR | Status: AC | PRN
  Administered 2013-08-30: 125 mL via INTRAVENOUS

## 2013-08-30 MED ORDER — SODIUM CHLORIDE 0.9 % IJ SOLN
INTRAMUSCULAR | Status: AC
  Filled 2013-08-30: qty 15

## 2013-08-30 MED ORDER — SODIUM CHLORIDE 0.9 % IJ SOLN
INTRAMUSCULAR | Status: AC
  Filled 2013-08-30: qty 400

## 2013-09-08 ENCOUNTER — Ambulatory Visit (INDEPENDENT_AMBULATORY_CARE_PROVIDER_SITE_OTHER): Payer: Medicare Other | Admitting: Urology

## 2013-09-08 DIAGNOSIS — N401 Enlarged prostate with lower urinary tract symptoms: Secondary | ICD-10-CM

## 2013-09-08 DIAGNOSIS — R3129 Other microscopic hematuria: Secondary | ICD-10-CM

## 2013-09-08 DIAGNOSIS — N200 Calculus of kidney: Secondary | ICD-10-CM

## 2013-09-11 ENCOUNTER — Other Ambulatory Visit: Payer: Self-pay | Admitting: Urology

## 2013-09-14 ENCOUNTER — Encounter (HOSPITAL_BASED_OUTPATIENT_CLINIC_OR_DEPARTMENT_OTHER): Payer: Self-pay | Admitting: *Deleted

## 2013-09-14 NOTE — Progress Notes (Signed)
Pt instructed npo p mn 8/12. X prilosec, linzess, spirivea. Instructed also no smoking p mn. To [facility] 8/13 @ 1145.  May take Xanax or percocet w sip of water if needed, but not both. Wife verbalized her understanding.  Pt asked to bring extra depends. Needs hgb, ekg on arrival.

## 2013-09-18 NOTE — H&P (Signed)
Active Problems  1. Benign prostatic hyperplasia with urinary obstruction (600.01,599.69)  2. Calculus of kidney (592.0)  3. History of recurrent UTIs (V13.02)  4. Increased urinary frequency (788.41)  5. Microscopic hematuria (599.72)  6. Penile pain (607.9)  7. Pyuria (791.9)  8. Urge incontinence of urine (788.31)  History of Present Illness  Mr. Brian Sims returns today in f/u for his history of hematuria and pelvic pain.   He had a CT prior to this visit that showed a cluster on non-obstructing left upper pole renal stones and some bladder wall thickening.    His UA today still has microhematuria.  His recent culture was negative and his PSA was normal.   He is to have cystoscopy.   Past Medical History  1. History of Anxiety (300.00)  2. History of Gross hematuria (599.71)  3. History of arthritis (V13.4)  4. History of carpal tunnel syndrome (V12.49)  5. History of chronic obstructive lung disease (V12.69)  6. History of depression (V11.8)  7. History of esophageal reflux (V12.79)  8. History of hypertension (V12.59)  9. History of IBS (V12.79)  10. History of Incomplete bladder emptying (788.21)  11. History of Loss of weight (783.21)  Surgical History  1. History of Arthroscopy Shoulder Left  2. History of Hand Surgery  3. History of Knee Arthroscopy  4. History of Lower Back Surgery  Current Meds  1. ALPRAZolam 1 MG Oral Tablet;  Therapy: (Recorded:17Jul2015) to Recorded  2. Linzess 145 MCG Oral Capsule;  Therapy: (Recorded:17Jul2015) to Recorded  3. Mirtazapine 30 MG Oral Tablet;  Therapy: (Recorded:17Jul2015) to Recorded  4. Omeprazole 20 MG Oral Tablet Delayed Release;  Therapy: (Recorded:17Jul2015) to Recorded  5. Oxycodone-Acetaminophen 10-325 MG Oral Tablet;  Therapy: (Recorded:17Jul2015) to Recorded  6. Proctozone-HC 2.5 % Rectal Cream;  Therapy: (Recorded:17Jul2015) to Recorded  7. Spiriva HandiHaler CAPS;  Therapy: (Recorded:17Jul2015) to Recorded  8.  TraZODone HCl - 150 MG Oral Tablet;  Therapy: (Recorded:17Jul2015) to Recorded  Allergies  1. Gabapentin CAPS  2. Flexeril  3. Lyrica CAPS  4. Nucynta TABS  Family History  1. Family history of Death of family member : Mother, Father, Sister, Brother  2. Family history of arthritis (V17.7) : Mother, Father, Brother  3. Family history of diabetes mellitus (V18.0) : Sister  4. Family history of irritable bowel syndrome (V18.59)  5. Family history of kidney stones (V18.69) : Brother  Social History  1. Denied: History of Alcohol use  2. Denied: History of Caffeine use  3. Current every day smoker (305.1)  4. Disabled  5. Married  6. Number of children   1 son  Review of Systems  Genitourinary: urinary frequency, feelings of urinary urgency, incontinence and hematuria.  Gastrointestinal: abdominal pain.  Constitutional: feeling tired (fatigue) and recent weight loss.  Cardiovascular: no chest pain.  Respiratory: no shortness of breath.    Vitals Vital Signs [Data Includes: Last 1 Day]  Recorded: 31Jul2015 01:37PM  Blood Pressure: 111 / 79 Temperature: 98.7 F Heart Rate: 90  Physical Exam Constitutional: Well nourished and well developed . No acute distress.  Pulmonary: No respiratory distress and normal respiratory rhythm and effort.  Cardiovascular: Heart rate and rhythm are normal . No peripheral edema.    Results/Data Urine [Data Includes: Last 1 Day]   [id number]  COLOR YELLOW   APPEARANCE CLEAR   SPECIFIC GRAVITY >1.030   pH 5.0   GLUCOSE NEG mg/dL  BILIRUBIN SMALL   KETONE NEG mg/dL  BLOOD LARGE   PROTEIN  100 mg/dL  UROBILINOGEN 0.2 mg/dL  NITRITE NEG   LEUKOCYTE ESTERASE NEG   SQUAMOUS EPITHELIAL/HPF RARE   WBC 0-2 WBC/hpf  RBC 11-20 RBC/hpf  BACTERIA RARE   CRYSTALS NONE SEEN   CASTS NONE SEEN    The following images/tracing/specimen were independently visualized:  CT films and report reviewed.  The following clinical lab reports were reviewed:   UA and labs reviewed.  Flow Rate: Voided 158 ml. A peak flow rate of 40ml/s, mean flow rate of 49ml/s and reduced but sustained. flow curve . PSA Flowsheet [Data Includes: All]   Goal 17Jul2015  Free PSA    % Free PSA    PSA  1.16 ng/mL  Renal Function [Data Includes: All]   [id number]  Creatinine 0.91 mg/dL  Est GFR African American >89 mL/min  FLEstGFRNAA >89 mL/min  Procedure  Procedure: Cystoscopy   Indication: Hematuria. Lower Urinary Tract Symptoms. Frequent UTI.  Prep: The patient was prepped with betadine.  Anesthesia:. Local anesthesia was administered intraurethrally with 2% lidocaine jelly.  Antibiotic prophylaxis: Ciprofloxacin.  Procedure Note:  Urethral meatus:. No abnormalities.  Anterior urethra: No abnormalities.  Prostatic urethra:. Estimated length was 3 cm. There was visual obstruction of the prostatic urethra. The lateral prostatic lobes were enlarged. No intravesical median lobe was visualized. With erythema and ?inflammatory changes vs CIS.  Bladder: Visulization was clear. The ureteral orifices were in the normal anatomic position bilaterally. A systematic survey of the bladder demonstrated no bladder tumors or stones.  At least no papillary tumors. Examination of the bladder demonstrated mild trabeculation erythematous mucosa (He has diffuse erythema with two patches on the posterior wall with a cobblestone appearance that is very suspicious for CIS. ).    Assessment  1. Microscopic hematuria (599.72)  2. Urge incontinence of urine (788.31)  3. Calculus of kidney (592.0)  4. 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   Plan Microscopic hematuria   1. Follow-up Schedule Surgery Office  Follow-up  Status: Hold For - Appointment   Requested for:  31Jul2015  2. URINE CYTOLOGY; Status:Hold For - Specimen/Data Collection,Appointment;  Requested for:31Jul2015;    I am going to get him set up for cystoscopy with bladder biopsy and fulguration and TUR biospy of the prostate.   I reviewed the risks of bleeding, infection, bladder and ureteral injury, strictures, incontinence, thrombotic events and anesthetic complications.  If he has CIS, he will need BCG. I will send a cytology today if there is sufficient urine.   Discussion/Summary   CC: Dr. Velvet Bathe and Dr. Hildred Laser.

## 2013-09-21 ENCOUNTER — Ambulatory Visit (HOSPITAL_BASED_OUTPATIENT_CLINIC_OR_DEPARTMENT_OTHER)
Admission: RE | Admit: 2013-09-21 | Discharge: 2013-09-21 | Disposition: A | Discharge status: Home / Self Care | Payer: Medicare Other | Source: Ambulatory Visit | Attending: Urology | Admitting: Urology

## 2013-09-21 ENCOUNTER — Ambulatory Visit (HOSPITAL_BASED_OUTPATIENT_CLINIC_OR_DEPARTMENT_OTHER): Payer: Medicare Other | Admitting: Anesthesiology

## 2013-09-21 ENCOUNTER — Encounter (HOSPITAL_BASED_OUTPATIENT_CLINIC_OR_DEPARTMENT_OTHER): Payer: Self-pay | Admitting: Anesthesiology

## 2013-09-21 ENCOUNTER — Encounter (HOSPITAL_BASED_OUTPATIENT_CLINIC_OR_DEPARTMENT_OTHER)
Admission: RE | Disposition: A | Discharge status: Home / Self Care | Payer: Self-pay | Source: Ambulatory Visit | Attending: Urology

## 2013-09-21 ENCOUNTER — Encounter (HOSPITAL_BASED_OUTPATIENT_CLINIC_OR_DEPARTMENT_OTHER): Payer: Medicare Other | Admitting: Anesthesiology

## 2013-09-21 DIAGNOSIS — J4489 Other specified chronic obstructive pulmonary disease: Secondary | ICD-10-CM | POA: Insufficient documentation

## 2013-09-21 DIAGNOSIS — C680 Malignant neoplasm of urethra: Secondary | ICD-10-CM | POA: Diagnosis not present

## 2013-09-21 DIAGNOSIS — F411 Generalized anxiety disorder: Secondary | ICD-10-CM | POA: Diagnosis not present

## 2013-09-21 DIAGNOSIS — F3289 Other specified depressive episodes: Secondary | ICD-10-CM | POA: Insufficient documentation

## 2013-09-21 DIAGNOSIS — F17200 Nicotine dependence, unspecified, uncomplicated: Secondary | ICD-10-CM | POA: Insufficient documentation

## 2013-09-21 DIAGNOSIS — C670 Malignant neoplasm of trigone of bladder: Secondary | ICD-10-CM | POA: Diagnosis not present

## 2013-09-21 DIAGNOSIS — F329 Major depressive disorder, single episode, unspecified: Secondary | ICD-10-CM | POA: Diagnosis not present

## 2013-09-21 DIAGNOSIS — N139 Obstructive and reflux uropathy, unspecified: Secondary | ICD-10-CM | POA: Insufficient documentation

## 2013-09-21 DIAGNOSIS — R3129 Other microscopic hematuria: Secondary | ICD-10-CM | POA: Insufficient documentation

## 2013-09-21 DIAGNOSIS — Z79899 Other long term (current) drug therapy: Secondary | ICD-10-CM | POA: Insufficient documentation

## 2013-09-21 DIAGNOSIS — J449 Chronic obstructive pulmonary disease, unspecified: Secondary | ICD-10-CM | POA: Insufficient documentation

## 2013-09-21 DIAGNOSIS — R350 Frequency of micturition: Secondary | ICD-10-CM | POA: Insufficient documentation

## 2013-09-21 DIAGNOSIS — I10 Essential (primary) hypertension: Secondary | ICD-10-CM | POA: Diagnosis not present

## 2013-09-21 DIAGNOSIS — N3289 Other specified disorders of bladder: Secondary | ICD-10-CM | POA: Diagnosis present

## 2013-09-21 DIAGNOSIS — N401 Enlarged prostate with lower urinary tract symptoms: Secondary | ICD-10-CM | POA: Insufficient documentation

## 2013-09-21 DIAGNOSIS — N3941 Urge incontinence: Secondary | ICD-10-CM | POA: Diagnosis not present

## 2013-09-21 DIAGNOSIS — C672 Malignant neoplasm of lateral wall of bladder: Secondary | ICD-10-CM | POA: Diagnosis not present

## 2013-09-21 HISTORY — DX: Bladder disorder, unspecified: N32.9

## 2013-09-21 HISTORY — DX: Urethral syndrome, unspecified: N34.3

## 2013-09-21 HISTORY — DX: Other specified disorders of prostate: N42.89

## 2013-09-21 HISTORY — DX: Unspecified osteoarthritis, unspecified site: M19.90

## 2013-09-21 HISTORY — PX: TRANSURETHRAL RESECTION OF BLADDER TUMOR: SHX2575

## 2013-09-21 LAB — POCT HEMOGLOBIN-HEMACUE: Hemoglobin: 17.1 g/dL — ABNORMAL HIGH (ref 13.0–17.0)

## 2013-09-21 SURGERY — TURBT (TRANSURETHRAL RESECTION OF BLADDER TUMOR)
Anesthesia: General | Site: Bladder

## 2013-09-21 MED ORDER — FENTANYL CITRATE 0.05 MG/ML IJ SOLN
25.0000 ug | INTRAMUSCULAR | Status: DC | PRN
  Filled 2013-09-21: qty 1

## 2013-09-21 MED ORDER — OXYCODONE HCL 5 MG PO TABS
5.0000 mg | ORAL_TABLET | ORAL | Status: DC | PRN
  Filled 2013-09-21: qty 2

## 2013-09-21 MED ORDER — SODIUM CHLORIDE 0.9 % IR SOLN
Status: DC | PRN
  Administered 2013-09-21: 6000 mL

## 2013-09-21 MED ORDER — MIDAZOLAM HCL 2 MG/2ML IJ SOLN
INTRAMUSCULAR | Status: AC
  Filled 2013-09-21: qty 2

## 2013-09-21 MED ORDER — LIDOCAINE HCL (CARDIAC) 20 MG/ML IV SOLN
INTRAVENOUS | Status: DC | PRN
  Administered 2013-09-21: 60 mg via INTRAVENOUS

## 2013-09-21 MED ORDER — CIPROFLOXACIN IN D5W 400 MG/200ML IV SOLN
400.0000 mg | INTRAVENOUS | Status: AC
  Administered 2013-09-21: 400 mg via INTRAVENOUS
  Filled 2013-09-21: qty 200

## 2013-09-21 MED ORDER — SODIUM CHLORIDE 0.9 % IV SOLN
250.0000 mL | INTRAVENOUS | Status: DC | PRN
  Filled 2013-09-21: qty 250

## 2013-09-21 MED ORDER — ACETAMINOPHEN 650 MG RE SUPP
650.0000 mg | RECTAL | Status: DC | PRN
  Filled 2013-09-21: qty 1

## 2013-09-21 MED ORDER — LACTATED RINGERS IV SOLN
INTRAVENOUS | Status: DC
  Administered 2013-09-21: 12:00:00 via INTRAVENOUS
  Filled 2013-09-21: qty 1000

## 2013-09-21 MED ORDER — CIPROFLOXACIN HCL 250 MG PO TABS: 250.0000 mg | ORAL_TABLET | Freq: Two times a day (BID) | ORAL | Status: AC

## 2013-09-21 MED ORDER — ACETAMINOPHEN 325 MG PO TABS
650.0000 mg | ORAL_TABLET | ORAL | Status: DC | PRN
  Filled 2013-09-21: qty 2

## 2013-09-21 MED ORDER — ONDANSETRON HCL 4 MG/2ML IJ SOLN
INTRAMUSCULAR | Status: DC | PRN
  Administered 2013-09-21: 4 mg via INTRAVENOUS

## 2013-09-21 MED ORDER — TIOTROPIUM BROMIDE MONOHYDRATE 18 MCG IN CAPS
18.0000 ug | ORAL_CAPSULE | Freq: Every day | RESPIRATORY_TRACT | Status: DC
  Administered 2013-09-21: 1 via RESPIRATORY_TRACT
  Filled 2013-09-21 (×2): qty 5

## 2013-09-21 MED ORDER — ACETAMINOPHEN 10 MG/ML IV SOLN
INTRAVENOUS | Status: DC | PRN
  Administered 2013-09-21: 1000 mg via INTRAVENOUS

## 2013-09-21 MED ORDER — PROPOFOL 10 MG/ML IV BOLUS
INTRAVENOUS | Status: DC | PRN
  Administered 2013-09-21: 150 mg via INTRAVENOUS

## 2013-09-21 MED ORDER — BELLADONNA ALKALOIDS-OPIUM 16.2-60 MG RE SUPP
RECTAL | Status: AC
  Filled 2013-09-21: qty 1

## 2013-09-21 MED ORDER — DEXAMETHASONE SODIUM PHOSPHATE 4 MG/ML IJ SOLN
INTRAMUSCULAR | Status: DC | PRN
  Administered 2013-09-21: 8 mg via INTRAVENOUS

## 2013-09-21 MED ORDER — FENTANYL CITRATE 0.05 MG/ML IJ SOLN
INTRAMUSCULAR | Status: AC
  Filled 2013-09-21: qty 4

## 2013-09-21 MED ORDER — MIDAZOLAM HCL 5 MG/5ML IJ SOLN
INTRAMUSCULAR | Status: DC | PRN
  Administered 2013-09-21 (×2): 1 mg via INTRAVENOUS

## 2013-09-21 MED ORDER — MIDAZOLAM HCL 2 MG/2ML IJ SOLN
1.0000 mg | INTRAMUSCULAR | Status: DC | PRN
  Filled 2013-09-21: qty 1

## 2013-09-21 MED ORDER — FENTANYL CITRATE 0.05 MG/ML IJ SOLN
INTRAMUSCULAR | Status: AC
  Filled 2013-09-21: qty 2

## 2013-09-21 MED ORDER — FENTANYL CITRATE 0.05 MG/ML IJ SOLN
50.0000 ug | INTRAMUSCULAR | Status: DC | PRN
  Filled 2013-09-21: qty 1

## 2013-09-21 MED ORDER — PHENAZOPYRIDINE HCL 200 MG PO TABS: 200.0000 mg | ORAL_TABLET | Freq: Three times a day (TID) | ORAL | Status: DC | PRN

## 2013-09-21 MED ORDER — SODIUM CHLORIDE 0.9 % IJ SOLN
3.0000 mL | INTRAMUSCULAR | Status: DC | PRN
  Filled 2013-09-21: qty 3

## 2013-09-21 MED ORDER — LACTATED RINGERS IV SOLN
INTRAVENOUS | Status: DC
  Filled 2013-09-21: qty 1000

## 2013-09-21 MED ORDER — SODIUM CHLORIDE 0.9 % IJ SOLN
3.0000 mL | Freq: Two times a day (BID) | INTRAMUSCULAR | Status: DC
  Filled 2013-09-21: qty 3

## 2013-09-21 MED ORDER — FENTANYL CITRATE 0.05 MG/ML IJ SOLN
INTRAMUSCULAR | Status: DC | PRN
  Administered 2013-09-21 (×3): 25 ug via INTRAVENOUS
  Administered 2013-09-21: 50 ug via INTRAVENOUS
  Administered 2013-09-21 (×3): 25 ug via INTRAVENOUS

## 2013-09-21 SURGICAL SUPPLY — 35 items
BAG DRAIN URO-CYSTO SKYTR STRL (DRAIN) ×4 IMPLANT
BAG URINE DRAINAGE (UROLOGICAL SUPPLIES) ×4 IMPLANT
BAG URINE LEG 19OZ MD ST LTX (BAG) IMPLANT
CANISTER SUCT LVC 12 LTR MEDI- (MISCELLANEOUS) ×4 IMPLANT
CATH FOLEY 2WAY SLVR  5CC 16FR (CATHETERS)
CATH FOLEY 2WAY SLVR  5CC 20FR (CATHETERS) ×2
CATH FOLEY 2WAY SLVR  5CC 22FR (CATHETERS)
CATH FOLEY 2WAY SLVR 5CC 16FR (CATHETERS) IMPLANT
CATH FOLEY 2WAY SLVR 5CC 20FR (CATHETERS) ×2 IMPLANT
CATH FOLEY 2WAY SLVR 5CC 22FR (CATHETERS) IMPLANT
CLOTH BEACON ORANGE TIMEOUT ST (SAFETY) ×4 IMPLANT
DRAPE CAMERA CLOSED 9X96 (DRAPES) ×4 IMPLANT
ELECT LOOP HF 26F 30D .35MM (CUTTING LOOP) IMPLANT
ELECT LOOP MED HF 24F 12D CBL (CLIP) ×4 IMPLANT
ELECT NEEDLE 45D HF 24-28F 12D (CUTTING LOOP) IMPLANT
ELECT REM PT RETURN 9FT ADLT (ELECTROSURGICAL)
ELECTRODE REM PT RTRN 9FT ADLT (ELECTROSURGICAL) IMPLANT
GLOVE BIOGEL PI IND STRL 7.5 (GLOVE) ×4 IMPLANT
GLOVE BIOGEL PI INDICATOR 7.5 (GLOVE) ×4
GLOVE SURG SS PI 8.0 STRL IVOR (GLOVE) ×4 IMPLANT
GOWN PREVENTION PLUS LG XLONG (DISPOSABLE) ×4 IMPLANT
GOWN STRL REIN XL XLG (GOWN DISPOSABLE) IMPLANT
GOWN STRL REUS W/TWL LRG LVL3 (GOWN DISPOSABLE) ×4 IMPLANT
HOLDER FOLEY CATH W/STRAP (MISCELLANEOUS) IMPLANT
LOOP CUTTING 24FR OLYMPUS (CUTTING LOOP) IMPLANT
NDL SAFETY ECLIPSE 18X1.5 (NEEDLE) IMPLANT
NEEDLE HYPO 18GX1.5 SHARP (NEEDLE)
NEEDLE HYPO 22GX1.5 SAFETY (NEEDLE) IMPLANT
NS IRRIG 500ML POUR BTL (IV SOLUTION) IMPLANT
PACK CYSTOSCOPY (CUSTOM PROCEDURE TRAY) ×4 IMPLANT
PLUG CATH AND CAP STER (CATHETERS) IMPLANT
SET ASPIRATION TUBING (TUBING) IMPLANT
SYR 20CC LL (SYRINGE) IMPLANT
SYRINGE IRR TOOMEY STRL 70CC (SYRINGE) IMPLANT
WATER STERILE IRR 3000ML UROMA (IV SOLUTION) ×4 IMPLANT

## 2013-09-21 NOTE — Transfer of Care (Signed)
Immediate Anesthesia Transfer of Care Note  Patient: Brian Sims  Procedure(s) Performed: Procedure(s) (LRB): TRANSURETHRAL RESECTION  PROSTATE  AND BLADDER  (N/A)  Patient Location: PACU  Anesthesia Type: General  Level of Consciousness: awake, alert  and oriented  Airway & Oxygen Therapy: Patient Spontanous Breathing and Patient connected to nasal cannula oxygen  Post-op Assessment: Report given to PACU RN and Post -op Vital signs reviewed and stable  Post vital signs: Reviewed and stable  Complications: No apparent anesthesia complications

## 2013-09-21 NOTE — Discharge Instructions (Addendum)
Cystoscopy, Care After Refer to this sheet in the next few weeks. These instructions provide you with information on caring for yourself after your procedure. Your caregiver may also give you more specific instructions. Your treatment has been planned according to current medical practices, but problems sometimes occur. Call your caregiver if you have any problems or questions after your procedure. HOME CARE INSTRUCTIONS  Things you can do to ease any discomfort after your procedure include: Drinking enough water and fluids to keep your urine clear or pale yellow. Taking a warm bath to relieve any burning feelings. SEEK IMMEDIATE MEDICAL CARE IF:  You have an increase in blood in your urine. You notice blood clots in your urine. You have difficulty passing urine. You have the chills. You have abdominal pain. You have a fever or persistent symptoms for more than 2-3 days. You have a fever and your symptoms suddenly get worse. MAKE SURE YOU:  Understand these instructions. Will watch your condition. Will get help right away if you are not doing well or get worse. Document Released: 08/15/2004 Document Revised: 09/28/2012 Document Reviewed: 07/20/2011 [facility] Patient Information 2015 [place], [place]. This information is not intended to replace advice given to you by your health care provider. Make sure you discuss any questions you have with your health care provider. Foley Catheter Care A Foley catheter is a soft, flexible tube. This tube is placed into your bladder to drain pee (urine). If you go home with this catheter in place, follow the instructions below. TAKING CARE OF THE CATHETER 1. Wash your hands with soap and water. 2. Put soap and water on a clean washcloth.  Clean the skin where the tube goes into your body.  Clean away from the tube site.  Never wipe toward the tube.  Clean the area using a circular motion.  Remove all the soap. Pat the area dry with a clean towel. For  males, reposition the skin that covers the end of the penis (foreskin). 3. Attach the tube to your leg with tape or a leg strap. Do not stretch the tube tight. If you are using tape, remove any stickiness left behind by past tape you used. 4. Keep the drainage bag below your hips. Keep it off the floor. 5. Check your tube during the day. Make sure it is working and draining. Make sure the tube does not curl, twist, or bend. 6. Do not pull on the tube or try to take it out. TAKING CARE OF THE DRAINAGE BAGS You will have a large overnight drainage bag and a small leg bag. You may wear the overnight bag any time. Never wear the small bag at night. Follow the directions below. Emptying the Drainage Bag Empty your drainage bag when it is  - full or at least 2-3 times a day. 1. Wash your hands with soap and water. 2. Keep the drainage bag below your hips. 3. Hold the dirty bag over the toilet or clean container. 4. Open the pour spout at the bottom of the bag. Empty the pee into the toilet or container. Do not let the pour spout touch anything. 5. Clean the pour spout with a gauze pad or cotton ball that has rubbing alcohol on it. 6. Close the pour spout. 7. Attach the bag to your leg with tape or a leg strap. 8. Wash your hands well. Changing the Drainage Bag Change your bag once a month or sooner if it starts to smell or look dirty.  1. Wash your hands with soap and water. 2. Pinch the rubber tube so that pee does not spill out. 3. Disconnect the catheter tube from the drainage tube at the connection valve. Do not let the tubes touch anything. 4. Clean the end of the catheter tube with an alcohol wipe. Clean the end of a the drainage tube with a different alcohol wipe. 5. Connect the catheter tube to the drainage tube of the clean drainage bag. 6. Attach the new bag to the leg with tape or a leg strap. Avoid attaching the new bag too tightly. 7. Wash your hands well. Cleaning the Drainage  Bag 1. Wash your hands with soap and water. 2. Wash the bag in warm, soapy water. 3. Rinse the bag with warm water. 4. Fill the bag with a mixture of white vinegar and water (1 cup vinegar to 1 quart warm water [.2 liter vinegar to 1 liter warm water]). Close the bag and soak it for 30 minutes in the solution. 5. Rinse the bag with warm water. 6. Hang the bag to dry with the pour spout open and hanging downward. 7. Store the clean bag (once it is dry) in a clean plastic bag. 8. Wash your hands well. PREVENT INFECTION  Wash your hands before and after touching your tube.  Take showers every day. Wash the skin where the tube enters your body. Do not take baths. Replace wet leg straps with dry ones, if this applies.  Do not use powders, sprays, or lotions on the genital area. Only use creams, lotions, or ointments as told by your doctor.  For females, wipe from front to back after going to the bathroom.  Drink enough fluids to keep your pee clear or pale yellow unless you are told not to have too much fluid (fluid restriction).  Do not let the drainage bag or tubing touch or lie on the floor.  Wear cotton underwear to keep the area dry. GET HELP IF:  Your pee is cloudy or smells unusually bad.  Your tube becomes clogged.  You are not draining pee into the bag or your bladder feels full.  Your tube starts to leak. GET HELP RIGHT AWAY IF:  You have pain, puffiness (swelling), redness, or yellowish-white fluid (pus) where the tube enters the body.  You have pain in the belly (abdomen), legs, lower back, or bladder.  You have a fever.  You see blood fill the tube, or your pee is pink or red.  You feel sick to your stomach (nauseous), throw up (vomit), or have chills.  Your tube gets pulled out. MAKE SURE YOU:   Understand these instructions.  Will watch your condition.  Will get help right away if you are not doing well or get worse. Document Released: 05/23/2012  Document Revised: 06/12/2013 Document Reviewed: 05/23/2012 [facility] Patient Information 2015 [place], [place]. This information is not intended to replace advice given to you by your health care provider. Make sure you discuss any questions you have with your health care provider.   You may remove the catheter in the morning.    Post Anesthesia Home Care Instructions  Activity: Get plenty of rest for the remainder of the day. A responsible adult should stay with you for 24 hours following the procedure.  For the next 24 hours, DO NOT: -Drive a car -Paediatric nurse -Drink alcoholic beverages -Take any medication unless instructed by your physician -Make any legal decisions or sign important papers.  Meals: Start with  liquid foods such as gelatin or soup. Progress to regular foods as tolerated. Avoid greasy, spicy, heavy foods. If nausea and/or vomiting occur, drink only clear liquids until the nausea and/or vomiting subsides. Call your physician if vomiting continues.  Special Instructions/Symptoms: Your throat may feel dry or sore from the anesthesia or the breathing tube placed in your throat during surgery. If this causes discomfort, gargle with warm salt water. The discomfort should disappear within 24 hours. Indwelling Urinary Catheter Care You have been given a flexible tube (catheter) used to drain the bladder. Catheters are often used when a person has difficulty urinating due to blockage, bleeding, infection, or inability to control bladder or bowel movements (incontinence). A catheter requires daily care to prevent infection and blockage. HOME CARE INSTRUCTIONS  Do the following to reduce the risk of infection. Antibiotic medicines cannot prevent infections. Limit the number of bacteria entering your bladder  Wash your hands for 2 minutes with soapy water before and after handling the catheter.  Wash your bottom and the entire catheter twice daily, as well as after each bowel  movement. Wash the tip of the penis or just above the vaginal opening with soap and warm water, rinse, and then wash the rectal area. Always wash from front to back.  When changing from the leg bag to overnight bag or from the overnight bag to leg bag, thoroughly clean the end of the catheter where it connects to the tubing with an alcohol wipe.  Clean the leg bag and overnight bag daily after use. Replace your drainage bags weekly.  Always keep the tubing and bag below the level of your bladder. This allows your urine to drain properly. Lifting the bag or tubing above the level of your bladder will cause dirty urine to flow back into your bladder. If you must briefly lift the bag higher than your bladder, pinch the catheter or tubing to prevent backflow.  Drink enough water and fluids to keep your urine clear or pale yellow, or as directed by your caregiver. This will flush bacteria out of the bladder. Protect tissues from injury  Attach the catheter to your leg so there is no tension on the catheter. Use adhesive tape or a leg strap. If you are using adhesive tape, remove any sticky residue left behind by the previous tape you used.  Place your leg bag on your lower leg. Fasten the straps securely and comfortably.  Do not remove the catheter yourself unless you have been instructed how to do so. Keep the urinary pathway open  Check throughout the day to be sure your catheter is working and urine is draining freely. Make sure the tubing does not become kinked.  Do not let the drainage bag overfill. SEEK IMMEDIATE MEDICAL CARE IF:   The catheter becomes blocked. Urine is not draining.  Urine is leaking.  You have any pain.  You have a fever. Document Released: 01/26/2005 Document Revised: 01/13/2012 Document Reviewed: 06/27/2009 [facility] Patient Information 2015 [place], [place]. This information is not intended to replace advice given to you by your health care provider. Make sure you  discuss any questions you have with your health care provider.

## 2013-09-21 NOTE — Anesthesia Postprocedure Evaluation (Signed)
Anesthesia Post Note  Patient: Brian Sims  Procedure(s) Performed: Procedure(s) (LRB): TRANSURETHRAL RESECTION  PROSTATE  AND BLADDER  (N/A)  Anesthesia type: General  Patient location: PACU  Post pain: Pain level controlled  Post assessment: Post-op Vital signs reviewed  Last Vitals: BP 148/69  Pulse 58  Temp(Src) 36.6 C (Oral)  Resp 15  Ht 5\' 9"  (1.753 m)  Wt 128 lb (58.06 kg)  BMI 18.89 kg/m2  SpO2 99%  Post vital signs: Reviewed  Level of consciousness: sedated  Complications: No apparent anesthesia complications

## 2013-09-21 NOTE — Anesthesia Preprocedure Evaluation (Addendum)
Anesthesia Evaluation  Patient identified by MRN, date of birth, ID band Patient awake    Reviewed: Allergy & Precautions, H&P , NPO status , Patient's Chart, lab work & pertinent test results  Airway Mallampati: II TM Distance: >3 FB Neck ROM: full    Dental  (+) Edentulous Upper, Missing, Dental Advisory Given   Pulmonary COPD COPD inhaler, Current Smoker,  breath sounds clear to auscultation  Pulmonary exam normal       Cardiovascular Exercise Tolerance: Good negative cardio ROS  Rhythm:regular Rate:Normal     Neuro/Psych negative neurological ROS  negative psych ROS   GI/Hepatic negative GI ROS, Neg liver ROS,   Endo/Other  negative endocrine ROS  Renal/GU negative Renal ROS  negative genitourinary   Musculoskeletal   Abdominal   Peds  Hematology negative hematology ROS (+)   Anesthesia Other Findings   Reproductive/Obstetrics negative OB ROS                         Anesthesia Physical Anesthesia Plan  ASA: III  Anesthesia Plan: General   Post-op Pain Management:    Induction: Intravenous  Airway Management Planned: LMA  Additional Equipment:   Intra-op Plan:   Post-operative Plan:   Informed Consent: I have reviewed the patients History and Physical, chart, labs and discussed the procedure including the risks, benefits and alternatives for the proposed anesthesia with the patient or authorized representative who has indicated his/her understanding and acceptance.   Dental Advisory Given  Plan Discussed with: CRNA and Surgeon  Anesthesia Plan Comments:         Anesthesia Quick Evaluation

## 2013-09-21 NOTE — Interval H&P Note (Signed)
History and Physical Interval Note:  09/21/2013 1:02 PM  Linnell Fulling  has presented today for surgery, with the diagnosis of bladder and prostate lesions  The various methods of treatment have been discussed with the patient and family. After consideration of risks, benefits and other options for treatment, the patient has consented to  Procedure(s): CYSTOSCOPY WITH BLADDER BIOPSY AND FULGURATION (N/A) POSSIBLE TRANSURETHRAL RESECTION BIOPSY PROSTATE  (N/A) as a surgical intervention .  The patient's history has been reviewed, patient examined, no change in status, stable for surgery.  I have reviewed the patient's chart and labs.  Questions were answered to the patient's satisfaction.     Brian Sims

## 2013-09-21 NOTE — Brief Op Note (Signed)
09/21/2013  1:49 PM  PATIENT:  Brian Sims  63 y.o. male  PRE-OPERATIVE DIAGNOSIS:  bladder and prostate lesions  POST-OPERATIVE DIAGNOSIS:  bladder and prostate lesions  PROCEDURE:  Procedure(s): TRANSURETHRAL RESECTION  PROSTATE  AND BLADDER  (N/A) >5cm  SURGEON:  Surgeon(s) and Role:    * [name], MD - Primary  PHYSICIAN ASSISTANT:   ASSISTANTS: none   ANESTHESIA:   general  EBL:  Total I/O In: 200 [I.V.:200] Out: -   BLOOD ADMINISTERED:none  DRAINS: Urinary Catheter (Foley)   LOCAL MEDICATIONS USED:  NONE  SPECIMEN:  Source of Specimen:  bladder tumor chips and prostate chip.   DISPOSITION OF SPECIMEN:  PATHOLOGY  COUNTS:  YES  TOURNIQUET:  * No tourniquets in log *  DICTATION: .Other Dictation: Dictation Number [id number]  PLAN OF CARE: Discharge to home after PACU  PATIENT DISPOSITION:  PACU - hemodynamically stable.   Delay start of Pharmacological VTE agent (>24hrs) due to surgical blood loss or risk of bleeding: yes

## 2013-09-21 NOTE — Anesthesia Procedure Notes (Signed)
Procedure Name: LMA Insertion Date/Time: 09/21/2013 1:15 PM Performed by: Mechele Claude Pre-anesthesia Checklist: Patient identified, Emergency Drugs available, Suction available and Patient being monitored Patient Re-evaluated:Patient Re-evaluated prior to inductionOxygen Delivery Method: Circle System Utilized Preoxygenation: Pre-oxygenation with 100% oxygen Intubation Type: IV induction Ventilation: Mask ventilation without difficulty LMA: LMA inserted LMA Size: 4.0 Number of attempts: 1 Airway Equipment and Method: bite block Placement Confirmation: positive ETCO2 Tube secured with: Tape Dental Injury: Teeth and Oropharynx as per pre-operative assessment

## 2013-09-22 ENCOUNTER — Encounter (HOSPITAL_BASED_OUTPATIENT_CLINIC_OR_DEPARTMENT_OTHER): Payer: Self-pay | Admitting: Urology

## 2013-09-22 NOTE — Op Note (Deleted)
NAMEMarland Kitchen  Brian Sims, Brian Sims NO.:  [id number]  MEDICAL RECORD NO.:  [id number]  LOCATION:                            FACILITY:  [facility]  PHYSICIAN:  Marshall Cork. Jeffie Pollock, M.D.    DATE OF BIRTH:  September 11, 1950  DATE OF PROCEDURE:  09/21/2013 DATE OF DISCHARGE:  09/21/2013                              OPERATIVE REPORT   PROCEDURE:  Cystoscopy with transurethral prostate biopsy and transurethral resection of bladder tumors greater than 5 cm.  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  SURGEON:  Marshall Cork. Jeffie Pollock, M.D.  ANESTHESIA:  General.  SPECIMEN:  Prostate chip and bladder tumor chips.  DRAINS:  A 20-French Foley catheter.  COMPLICATIONS:  None.  INDICATIONS:  Brian Sims is a 63 year old, white male, who presented with hematuria and irritative voiding symptoms.  Office cystoscopy revealed findings worrisome for carcinoma in situ.  In both the bladder and the prostatic urethra, it was felt that TUR prostate biopsy and bladder tumor resection were indicated.  FINDINGS AND PROCEDURE:  He was given Cipro and taken to the operating room where general anesthetic was induced.  He was placed in lithotomy position and fitted with PAS hose.  His perineum and genitalia were prepped with Betadine solution and he was draped in usual sterile fashion.  The cystoscopy was performed with a 22-French scope and 12-degree lens. However, I was unable to get the scope through the meatus, so the urethra was calibrated to 30-French with [employer] sounds.  The first cystoscopy then demonstrated some bleeding from the urethra from the dilation, but no lesions. The prostatic urethra was short without obstruction.  On the right lateral lobe were some papillary fronds that are worrisome for  carcinoma in situ/urothelial carcinoma versus inflammatory changes.  Inspection of the bladder revealed mild trabeculation.  The ureteral orifices were somewhat lateral and more patulous than I would consider normal.  Inspection of the bladder mucosa revealed patchy erythematous areas with some cobblestoning and low papillary lesions of the trigone and the right and left posterior and lateral walls.  There was not much abnormality on the anterior bladder or anterior bladder neck.  Once thorough inspection was performed, a 28-French continuous flow resectoscope sheath was inserted.  This was fitted with Beat[employer] Fetters handle, a bipolar loop and 12-degree lens.  Saline was used for the irrigant.  The abnormal area and the prostate was resected in a single chip.  This was removed as a separate specimen and the biopsy bed was fulgurated.  I then biopsied several areas in the bladder using the resectoscope.  The largest potential tumor was in the trigone just to the right of midline. I also obtained tissue from the posterior wall and the right lateral wall.  Once these specimens were obtained, the abnormal lesions were widely fulgurated with the patch on the left lateral wall.  It was approximately 2 x 4 cm patch on the right trigone and right lateral wall that were contiguous.  They were approximately 4 x 6 cm.  An additional area on the right  bladder neck was fulgurated.  I did not feel that I was able to fulgurate the entire abnormal mucosa due to the extent, but I did fulgurate the bulk of it.  Once fulguration was completed and the tumor chips were retrieved for pathologic analysis, the bladder was drained and refilled.  No significant bleeding was noted, so the scope was removed.  A 20-French Foley catheter was inserted.  The balloon was filled with 10 mL sterile fluid.  The catheter was irrigated with clear return.  The patient was taken down from lithotomy position.  His anesthetic was  reversed.  He was moved to the recovery room in stable condition.  There were no complications.     Marshall Cork. Jeffie Pollock, M.D.     JJW/MEDQ  D:  09/21/2013  T:  09/22/2013  Job:  [id number]

## 2013-09-22 NOTE — Op Note (Signed)
NAMEMarland Sims  MUNG, RINKER NO.:  [id number]  MEDICAL RECORD NO.:  [id number]  LOCATION:                            FACILITY:  [facility]  PHYSICIAN:  Marshall Cork. Jeffie Pollock, M.D.    DATE OF BIRTH:  Sep 21, 1950  DATE OF PROCEDURE:  09/21/2013 DATE OF DISCHARGE:  09/21/2013                              OPERATIVE REPORT   PROCEDURE:  Cystoscopy with transurethral prostate biopsy and transurethral resection of bladder tumors greater than 5 cm.  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  SURGEON:  Marshall Cork. Jeffie Pollock, M.D.  ANESTHESIA:  General.  SPECIMEN:  Prostate chip and bladder tumor chips.  DRAINS:  A 20-French Foley catheter.  COMPLICATIONS:  None.  INDICATIONS:  Mr. Brian Sims is a 63 year old, white male, who presented with hematuria and irritative voiding symptoms.  Office cystoscopy revealed findings worrisome for carcinoma in situ.  In both the bladder and the prostatic urethra, it was felt that TUR prostate biopsy and bladder tumor resection were indicated.  FINDINGS AND PROCEDURE:  He was given Cipro and taken to the operating room where general anesthetic was induced.  He was placed in lithotomy position and fitted with PAS hose.  His perineum and genitalia were prepped with Betadine solution and he was draped in usual sterile fashion.  The cystoscopy was performed with a 22-French scope and 12-degree lens. However, I was unable to get the scope through the meatus, so the urethra was calibrated to 30-French with [employer] sounds.  The first cystoscopy then demonstrated some bleeding from the urethra from the dilation, but no lesions. The prostatic urethra was short without obstruction.  On the right lateral lobe were some papillary fronds that are worrisome for  carcinoma in situ/urothelial carcinoma versus inflammatory changes.  Inspection of the bladder revealed mild trabeculation.  The ureteral orifices were somewhat lateral and more patulous than I would consider normal.  Inspection of the bladder mucosa revealed patchy erythematous areas with some cobblestoning and low papillary lesions of the trigone and the right and left posterior and lateral walls.  There was not much abnormality on the anterior bladder or anterior bladder neck.  Once thorough inspection was performed, a 28-French continuous flow resectoscope sheath was inserted.  This was fitted with [employer] handle, a bipolar loop and 12-degree lens.  Saline was used for the irrigant.  The abnormal area and the prostate was resected in a single chip.  This was removed as a separate specimen and the biopsy bed was fulgurated.  I then biopsied several areas in the bladder using the resectoscope.  The largest potential tumor was in the trigone just to the right of midline. I also obtained tissue from the posterior wall and the right lateral wall.  Once these specimens were obtained, the abnormal lesions were widely fulgurated with the patch on the left lateral wall.  It was approximately 2 x 4 cm patch on the right trigone and right lateral wall that were contiguous.  They were approximately 4 x 6 cm.  An additional area on the right  bladder neck was fulgurated.  I did not feel that I was able to fulgurate the entire abnormal mucosa due to the extent, but I did fulgurate the bulk of it.  Once fulguration was completed and the tumor chips were retrieved for pathologic analysis, the bladder was drained and refilled.  No significant bleeding was noted, so the scope was removed.  A 20-French Foley catheter was inserted.  The balloon was filled with 10 mL sterile fluid.  The catheter was irrigated with clear return.  The patient was taken down from lithotomy position.  His anesthetic was  reversed.  He was moved to the recovery room in stable condition.  There were no complications.     Marshall Cork. Jeffie Pollock, M.D.     JJW/MEDQ  D:  09/21/2013  T:  09/22/2013  Job:  [id number]

## 2013-09-22 NOTE — Op Note (Deleted)
NAMEMarland Kitchen  Brian Sims, Brian Sims NO.:  [id number]  MEDICAL RECORD NO.:  [id number]  LOCATION:                            FACILITY:  [facility]  PHYSICIAN:  Marshall Cork. Jeffie Pollock, M.D.    DATE OF BIRTH:  Aug 03, 1950  DATE OF PROCEDURE:  09/21/2013 DATE OF DISCHARGE:  09/21/2013                              OPERATIVE REPORT   PROCEDURE:  Cystoscopy with transurethral prostate biopsy and transurethral resection of bladder tumors greater than 5 cm.  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  SURGEON:  Marshall Cork. Jeffie Pollock, M.D.  ANESTHESIA:  General.  SPECIMEN:  Prostate chip and bladder tumor chips.  DRAINS:  A 20-French Foley catheter.  COMPLICATIONS:  None.  INDICATIONS:  Brian Sims is a 63 year old, white male, who presented with hematuria and irritative voiding symptoms.  Office cystoscopy revealed findings worrisome for carcinoma in situ.  In both the bladder and the prostatic urethra, it was felt that TUR prostate biopsy and bladder tumor resection were indicated.  FINDINGS AND PROCEDURE:  He was given Cipro and taken to the operating room where general anesthetic was induced.  He was placed in lithotomy position and fitted with PAS hose.  His perineum and genitalia were prepped with Betadine solution and he was draped in usual sterile fashion.  The cystoscopy was performed with a 22-French scope and 12-degree lens. However, I was unable to get the scope through the meatus, so the urethra was calibrated to 30-French with [employer] sounds.  The first cystoscopy then demonstrated some bleeding from the urethra from the dilation, but no lesions. The prostatic urethra was short without obstruction.  On the right lateral lobe were some papillary fronds that are worrisome for  carcinoma in situ/urothelial carcinoma versus inflammatory changes.  Inspection of the bladder revealed mild trabeculation.  The ureteral orifices were somewhat lateral and more patulous than I would consider normal.  Inspection of the bladder mucosa revealed patchy erythematous areas with some cobblestoning and low papillary lesions of the trigone and the right and left posterior and lateral walls.  There was not much abnormality on the anterior bladder or anterior bladder neck.  Once thorough inspection was performed, a 28-French continuous flow resectoscope sheath was inserted.  This was fitted with Beatrix Fetters handle, a bipolar loop and 12-degree lens.  Saline was used for the irrigant.  The abnormal area and the prostate was resected in a single chip.  This was removed as a separate specimen and the biopsy bed was fulgurated.  I then biopsied several areas in the bladder using the resectoscope.  The largest potential tumor was in the trigone just to the right of midline. I also obtained tissue from the posterior wall and the right lateral wall.  Once these specimens were obtained, the abnormal lesions were widely fulgurated with the patch on the left lateral wall.  It was approximately 2 x 4 cm patch on the right trigone and right lateral wall that were contiguous.  They were approximately 4 x 6 cm.  An additional area on the right  bladder neck was fulgurated.  I did not feel that I was able to fulgurate the entire abnormal mucosa due to the extent, but I did fulgurate the bulk of it.  Once fulguration was completed and the tumor chips were retrieved for pathologic analysis, the bladder was drained and refilled.  No significant bleeding was noted, so the scope was removed.  A 20-French Foley catheter was inserted.  The balloon was filled with 10 mL sterile fluid.  The catheter was irrigated with clear return.  The patient was taken down from lithotomy position.  His anesthetic was  reversed.  He was moved to the recovery room in stable condition.  There were no complications.     Marshall Cork. Jeffie Pollock, M.D.     JJW/MEDQ  D:  09/21/2013  T:  09/22/2013  Job:  [id number]

## 2013-10-03 ENCOUNTER — Other Ambulatory Visit: Payer: Self-pay | Admitting: Urology

## 2013-10-03 ENCOUNTER — Ambulatory Visit (HOSPITAL_COMMUNITY)
Admission: RE | Admit: 2013-10-03 | Discharge: 2013-10-03 | Disposition: A | Discharge status: Home / Self Care | Payer: Medicare Other | Source: Ambulatory Visit | Attending: Urology | Admitting: Urology

## 2013-10-03 DIAGNOSIS — R222 Localized swelling, mass and lump, trunk: Secondary | ICD-10-CM | POA: Diagnosis not present

## 2013-10-03 DIAGNOSIS — C679 Malignant neoplasm of bladder, unspecified: Secondary | ICD-10-CM

## 2013-10-04 ENCOUNTER — Other Ambulatory Visit: Payer: Self-pay | Admitting: Urology

## 2013-10-04 DIAGNOSIS — Z8709 Personal history of other diseases of the respiratory system: Secondary | ICD-10-CM

## 2013-10-04 DIAGNOSIS — R911 Solitary pulmonary nodule: Secondary | ICD-10-CM

## 2013-10-05 ENCOUNTER — Ambulatory Visit (HOSPITAL_COMMUNITY)
Admission: RE | Admit: 2013-10-05 | Discharge: 2013-10-05 | Disposition: A | Discharge status: Home / Self Care | Payer: Medicare Other | Source: Ambulatory Visit | Attending: Urology | Admitting: Urology

## 2013-10-05 DIAGNOSIS — R222 Localized swelling, mass and lump, trunk: Secondary | ICD-10-CM | POA: Diagnosis not present

## 2013-10-05 DIAGNOSIS — J438 Other emphysema: Secondary | ICD-10-CM | POA: Insufficient documentation

## 2013-10-05 DIAGNOSIS — R911 Solitary pulmonary nodule: Secondary | ICD-10-CM

## 2013-10-05 DIAGNOSIS — I2584 Coronary atherosclerosis due to calcified coronary lesion: Secondary | ICD-10-CM | POA: Diagnosis not present

## 2013-10-05 DIAGNOSIS — Z8709 Personal history of other diseases of the respiratory system: Secondary | ICD-10-CM

## 2013-10-05 MED ORDER — IOHEXOL 300 MG/ML  SOLN
80.0000 mL | Freq: Once | INTRAMUSCULAR | Status: AC | PRN
  Administered 2013-10-05: 80 mL via INTRAVENOUS

## 2013-10-11 ENCOUNTER — Other Ambulatory Visit (HOSPITAL_COMMUNITY): Payer: Self-pay | Admitting: Pulmonary Disease

## 2013-10-11 DIAGNOSIS — R918 Other nonspecific abnormal finding of lung field: Secondary | ICD-10-CM

## 2013-10-13 ENCOUNTER — Encounter (HOSPITAL_COMMUNITY): Payer: Self-pay

## 2013-10-13 ENCOUNTER — Ambulatory Visit (HOSPITAL_COMMUNITY)
Admission: RE | Admit: 2013-10-13 | Discharge: 2013-10-13 | Disposition: A | Discharge status: Home / Self Care | Payer: Medicare Other | Source: Ambulatory Visit | Attending: Diagnostic Radiology | Admitting: Diagnostic Radiology

## 2013-10-13 DIAGNOSIS — R222 Localized swelling, mass and lump, trunk: Secondary | ICD-10-CM | POA: Diagnosis present

## 2013-10-13 DIAGNOSIS — R918 Other nonspecific abnormal finding of lung field: Secondary | ICD-10-CM

## 2013-10-13 LAB — GLUCOSE, CAPILLARY: GLUCOSE-CAPILLARY: 94 mg/dL (ref 70–99)

## 2013-10-13 MED ORDER — FLUDEOXYGLUCOSE F - 18 (FDG) INJECTION: 6.5600 | Freq: Once | INTRAVENOUS | Status: AC | PRN

## 2013-10-20 ENCOUNTER — Encounter: Payer: Self-pay | Admitting: *Deleted

## 2013-10-20 NOTE — [facility] Oncology Navigator Note (Unsigned)
Received referral from [facility].  The referral has Dr. Everrett Coombe name on the front.  I have reviewed the referral information, scans, pathology, and surgery report.  Patient has known high grade urothelial carcinoma.  I will send information to scheduling to have pt set up with Dr. Alen Blew.    During patients work up, a lung nodule was found.  Due to hypermetabolic activity on PET, I called [facility] back and asked that patient be set up with Dr. Servando Snare per referral request.  I informed Dr. Julien Nordmann.

## 2013-10-23 ENCOUNTER — Telehealth: Payer: Self-pay | Admitting: Oncology

## 2013-10-23 NOTE — Telephone Encounter (Signed)
C/D 10/23/13 for appt. 10/26/13

## 2013-10-23 NOTE — Telephone Encounter (Signed)
S/W PATIENT WIFE AND GAVE NP APPT FOR 09/17 @ 10:30 W/DR. SHADAD.  REFERRING DR. Sinda Du

## 2013-10-24 ENCOUNTER — Other Ambulatory Visit: Payer: Self-pay | Admitting: *Deleted

## 2013-10-24 ENCOUNTER — Institutional Professional Consult (permissible substitution) (INDEPENDENT_AMBULATORY_CARE_PROVIDER_SITE_OTHER): Payer: Medicare Other | Admitting: Cardiothoracic Surgery

## 2013-10-24 ENCOUNTER — Encounter: Payer: Self-pay | Admitting: Cardiothoracic Surgery

## 2013-10-24 ENCOUNTER — Encounter (HOSPITAL_COMMUNITY): Payer: Self-pay | Admitting: *Deleted

## 2013-10-24 ENCOUNTER — Encounter (HOSPITAL_COMMUNITY): Payer: Self-pay | Admitting: Pharmacy Technician

## 2013-10-24 ENCOUNTER — Other Ambulatory Visit: Payer: Self-pay

## 2013-10-24 VITALS — BP 121/82 | HR 77 | Ht 69.0 in | Wt 128.0 lb

## 2013-10-24 DIAGNOSIS — R918 Other nonspecific abnormal finding of lung field: Secondary | ICD-10-CM

## 2013-10-24 DIAGNOSIS — R222 Localized swelling, mass and lump, trunk: Secondary | ICD-10-CM

## 2013-10-24 DIAGNOSIS — J984 Other disorders of lung: Secondary | ICD-10-CM

## 2013-10-24 NOTE — Progress Notes (Signed)
[name]       [place] [address]             [phone]                    Semir N Ballow [place] Medical Record #[id number] Date of Birth: 1950/09/10  Referring: [name], MD Primary Care: [name], MD  Chief Complaint:  Chest x-ray suspicious for left lung cancer, biopsy proven bladder cancer.  History of Present Illness:    Brian Sims 63 y.o. male is seen in the office  today for evaluation of a left lung mass. 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 He notes that he spends more than 80% of his time in bed. She notes that in the last year he's been weak but only on 2 occasions has he walked in [place]. He began having increasing lower pelvic pain and bladder problems and was recently diagnosed by Dr.  Jeffie Pollock, with bladder cancer. A radical cystectomy is being contemplated. A CT scan of the chest followed by PET scan confirmed a left lung mass just under 3 cm in size.   The patient is a long-term smoker more than 45 years and continues to smoke daily.      Current Activity/ Functional Status:  Patient was independent with mobility/ambulation, transfers, ADL's, IADL's.    Zubrod Score: At the time of surgery this patient's most appropriate activity status/level should be described as: []     0    Normal activity, no symptoms []     1    Restricted in physical strenuous activity but ambulatory, able to do out light work []     2    Ambulatory and capable of self care, unable to do work activities, up and about               >50 % of waking hours                              [x]     3    Only limited self care, in bed greater than 50% of waking hours []     4    Completely disabled, no self care, confined to bed or chair []     5    Moribund   Past Medical History  Diagnosis Date  . COPD (chronic obstructive pulmonary disease)   . Inflammatory  bowel disease   . Constipation   . Lesion of bladder   . Prostate mass   . Dysuria-frequency syndrome     wears depends  . Arthritis     Past Surgical History  Procedure Laterality Date  . Back surgery    . Left shoulder      arthroscopy  . Rt knee arthroscopy    . Colonoscopy N/A 01/11/2013    Procedure: COLONOSCOPY;  Surgeon: [name], MD;  Location: [facility];  Service: Endoscopy;  Laterality: N/A;  340  . Carpal tunnel release Right   . Transurethral resection of bladder tumor N/A 09/21/2013    Procedure: TRANSURETHRAL RESECTION  PROSTATE  AND BLADDER ;  Surgeon: [name], MD;  Location: [facility];  Service: Urology;  Laterality: N/A;    Family History: Patient's mother died at 59 with MRSA father died of myocardial  infarction   History   Social History  . Marital Status: Married    Spouse Name: N/A    Number of Children: N/A  . Years of Education: N/A   Occupational History  . Not on file.   Social History Main Topics  . Smoking status: Current Every Day Smoker -- 1.00 packs/day for 40 years    Types: Cigarettes  . Smokeless tobacco: Not on file     Comment: 1 pack a day since age 37  . Alcohol Use: No  . Drug Use: No  . Sexual Activity: Not on file   Other Topics Concern  . Not on file   Social History Narrative  . No narrative on file    History  Smoking status  . Current Every Day Smoker -- 1.00 packs/day for 40 years  . Types: Cigarettes  Smokeless tobacco  . Not on file    Comment: 1 pack a day since age 56    History  Alcohol Use No     Allergies  Allergen Reactions  . Gabapentin Palpitations    Dizziness, sweating, nausea  . Morphine And Related Palpitations    Lightheaded, dizziness, nausea    Current Outpatient Prescriptions  Medication Sig Dispense Refill  . ALPRAZolam (XANAX) 1 MG tablet Take 1 mg by mouth 4 (four) times daily as needed for anxiety.       . Linaclotide (LINZESS) 145 MCG CAPS  capsule Take 145 mcg by mouth daily.      . mirtazapine (REMERON) 30 MG tablet Take 30 mg by mouth at bedtime.      . Multiple Vitamins-Minerals (MULTIVITAMIN WITH MINERALS) tablet Take 1 tablet by mouth daily.      Marland Kitchen omeprazole (PRILOSEC) 20 MG capsule Take 20 mg by mouth 2 (two) times daily before a meal.      . tiotropium (SPIRIVA) 18 MCG inhalation capsule Place 18 mcg into inhaler and inhale daily.      . traZODone (DESYREL) 150 MG tablet Take 150 mg by mouth at bedtime.       . hydrocortisone (ANUSOL-HC) 2.5 % rectal cream Place 1 application rectally daily as needed for hemorrhoids or itching.      Marland Kitchen oxyCODONE-acetaminophen (PERCOCET) 10-325 MG per tablet Take 1 tablet by mouth every 4 (four) hours as needed for pain (pt takes 1/2 tab q 2 hrs).       . phenazopyridine (PYRIDIUM) 200 MG tablet Take 1 tablet (200 mg total) by mouth 3 (three) times daily as needed for pain.  12 tablet  0   No current facility-administered medications for this visit.     Review of Systems:     Cardiac Review of Systems: Y or N  Chest Pain [  y  ]  Resting SOB [ y  ] Exertional SOB  Blue.Reese  ]  Orthopnea [ y ]   Pedal Edema [ y  ]    Palpitations Blue.Reese  ] Syncope  [ n ]   Presyncope [ y  ]  General Review of Systems: [Y] = yes [  ]=no Constitional: recent weight change [  ];  Wt loss over the last 3 months [ y  ] anorexia [ y ]; fatigue [ y ]; nausea [ y ]; night sweats [  ]; fever [  ]; or chills [  ];          Dental: poor dentition[  ]; Last Dentist visit:   Eye : blurred vision [  ];  diplopia [   ]; vision changes [  ];  Amaurosis fugax[  ]; Resp: cough [  ];  wheezing[  ];  hemoptysis[  ]; shortness of breath[  ]; paroxysmal nocturnal dyspnea[  ]; dyspnea on exertion[  ]; or orthopnea[  ];  GI:  gallstones[  ], vomiting[  ];  dysphagia[  ]; melena[  ];  hematochezia [  ]; heartburn[  ];   Hx of  Colonoscopy[  ]; GU: kidney stones Blue.Reese  ]; hematuria[y  ];   dysuria [ y ];  nocturia[y  ];  history of      obstruction [  y]; urinary frequency [ y ]             Skin: rash, swelling[  ];, hair loss[  ];  peripheral edema[  ];  or itching[  ]; Musculosketetal: myalgias[  ];  joint swelling[  ];  joint erythema[  ];  joint pain[  ];  back pain[  ];  Heme/Lymph: bruising[  ];  bleeding[  ];  anemia[  ];  Neuro: TIA[  ];  headaches[  ];  stroke[ n ];  vertigo[y  ];  seizures[n  ];   paresthesias[  y];  difficulty walking[y  ];  Psych:depression[y  ]; anxiety[y  ];  Endocrine: diabetes[  ];  thyroid dysfunction[  ];  Immunizations: Flu up to date [  ]; Pneumococcal up to date [  ];  Other:  Physical Exam: BP 121/82  Pulse 77  Ht 5\' 9"  (1.753 m)  Wt 128 lb (58.06 kg)  BMI 18.89 kg/m2  SpO2 91%  PHYSICAL EXAMINATION:  General appearance: alert, cooperative, appears older than stated age and fatigued Neurologic: intact Heart: regular rate and rhythm, S1, S2 normal, no murmur, click, rub or gallop Lungs: diminished breath sounds bibasilar Abdomen: soft, non-tender; bowel sounds normal; no masses,  no organomegaly Extremities: extremities normal, atraumatic, no cyanosis or edema and Homans sign is negative, no sign of DVT The patient has no carotid bruits, he has palpable DP and PT pulses mild pedal edema bilaterally  Diagnostic Studies & Laboratory data:     Recent Radiology Findings:   Dg Chest 2 View  10/04/2013   CLINICAL DATA:  Bladder cancer.  EXAM: CHEST  2 VIEW  COMPARISON:  CT chest 12/29/2011 and chest radiograph 06/06/2009.  FINDINGS: Trachea is midline. Heart size normal. Left hilum may be minimally enlarged. An ovoid mass in the left lower lobe measures 2.8 x 3.4 cm. Lungs are hyperinflated. No pleural fluid.  IMPRESSION: Left lower lobe mass is highly worrisome for primary bronchogenic carcinoma. Solitary metastasis is considered less likely. CT chest with contrast is recommended in further evaluation. These results will be called to the ordering clinician or representative by the  Radiologist Assistant, and communication documented in the PACS or zVision Dashboard.   Electronically Signed   By: Lorin Picket M.D.   On: 10/04/2013 08:17   Ct Chest W Contrast  10/05/2013   CLINICAL DATA:  Evaluate lung mass  EXAM: CT CHEST WITH CONTRAST  TECHNIQUE: Multidetector CT imaging of the chest was performed during intravenous contrast administration.  CONTRAST:  16mL OMNIPAQUE IOHEXOL 300 MG/ML  SOLN  COMPARISON:  12/29/2011  FINDINGS: No pleural effusion. There is no airspace consolidation identified. Mild changes of centrilobular emphysema. Left lower lobe lung mass measures 2.9 x 2.5 cm, image 35/series 3. 3 mm nodule is identified within the right lower lobe, image 49/series 3. There is also a 3  mm right lower lobe nodule, image 47/series 3. Nonspecific right upper lobe nodule measures 3 mm, image 20/series 3.  The heart size is normal. 9 mm left paratracheal lymph node is identified, image 22/series 2. No enlarged right paratracheal or right hilar lymph nodes. No supraclavicular or axillary adenopathy. Atherosclerotic disease involves the thoracic aorta as well as the LAD, left circumflex and RCA coronary arteries.  7 mm low attenuation structure is identified within the left hepatic lobe, image 54/series 2. This is unchanged from previous exam and is likely a benign abnormality. The adrenal glands both appear normal.  Review of the visualized osseous structures is significant for mild spondylosis within the lower thoracic spine. There are no aggressive lytic or sclerotic bone lesions identified.  IMPRESSION: 1. Left lower lobe lung mass is worrisome for primary bronchogenic carcinoma. Further evaluation with PET-CT and tissue sampling is advised. 2. Prominent but non pathologically enlarged left paratracheal lymph node measures 9 mm. Attention in this lymph node on PET-CT is recommended 3. Emphysema 4. Atherosclerotic disease including 3 vessel coronary artery calcifications.    Electronically Signed   By: Kerby Moors M.D.   On: 10/05/2013 17:23   Nm Pet Image Initial (pi) Skull Base To Thigh  10/13/2013   CLINICAL DATA:  Initial treatment strategy for lung mass.  EXAM: NUCLEAR MEDICINE PET SKULL BASE TO THIGH  TECHNIQUE: 6.6 MCi F-18 FDG was injected intravenously. Full-ring PET imaging was performed from the skull base to thigh after the radiotracer. CT data was obtained and used for attenuation correction and anatomic localization.  FASTING BLOOD GLUCOSE:  Value: 94 mg/dl  COMPARISON:  CT chest from 10/05/2013  FINDINGS: NECK  No hypermetabolic lymph nodes in the neck.  CHEST  A 2.9 x 2.5 cm left lower lobe mass seen on the recent chest CT is markedly hypermetabolic with SUV max = 96.7. Multiple tiny right lung nodules were seen on the previous CT is well. These do not show FDG uptake but are below the accepted size threshold for reliable resolution on PET imaging.  No hypermetabolic uptake in the mediastinum or either hilum.  ABDOMEN/PELVIS  No abnormal hypermetabolic activity within the liver, pancreas, adrenal glands, or spleen. No hypermetabolic lymph nodes in the abdomen or pelvis.  Tiny low-density foci in the liver are likely related the cysts. 5 x 7 mm nonobstructing stone is identified in the interpolar left kidney. Atherosclerotic calcification noted in the abdominal aorta without aneurysm.  SKELETON  No focal hypermetabolic activity to suggest skeletal metastasis.  IMPRESSION: Hypermetabolic left lower lobe pulmonary mass compatible with malignancy. No evidence for malignant range uptake in hilar or mediastinal lymph nodes. No evidence for distant hypermetabolic metastases.   Electronically Signed   By: Misty Stanley M.D.   On: 10/13/2013 14:35      Recent Lab Findings: Lab Results  Component Value Date   WBC 12.5* 08/22/2009   HGB 17.1* 09/21/2013   HCT 51.0 08/22/2009   PLT 193 08/22/2009   GLUCOSE 106* 08/22/2009   NA 139 08/22/2009   K 4.3 08/22/2009   CL 104  08/22/2009   CREATININE 0.96 11/26/2009   BUN 8 08/22/2009   CO2 29 08/22/2009   09/21/2013 Diagnosis 1. Urethra, biopsy - HIGH GRADE UROTHELIAL CARCINOMA, SEE COMMENT. - MUSCULARIS PROPRIA IS PRESENT AND UNINVOLVED. 2. Bladder, biopsy, wall - HIGH GRADE UROTHELIAL CARCINOMA, SEE COMMENT. - NO MUSCULARIS PROPRIA PRESENT.   Assessment / Plan:   Clinical stage T1b,N0,M0  Stage Ia, with very limited  physical reserve, full PFTs are pending. HIGH GRADE UROTHELIAL CARCINOMA- radical cystectomy is being considered. Marland Kitchen COPD (chronic obstructive pulmonary disease)   . Inflammatory bowel disease   . Constipation   . Lesion of bladder   . Prostate mass   . Dysuria-frequency syndrome     wears depends  . Arthritis     I recommended to the patient we proceed with obtaining full pulmonary function studies, but taking a history from he and his wife he has a very low functional level. We will plan tomorrow we'll to do bronchoscopy with enb to obtain a tissue diagnosis, will obtain full pulmonary function studies. After this information obtained make a decision about suitability for resection versus treatment with radiation therapy.    I spent 60 minutes counseling the patient face to face. The total time spent in the appointment was 80 minutes.  [name] MD      [address] [place] [address] Office [phone]   Beeper [phone]  10/24/2013 5:51 PM

## 2013-10-25 ENCOUNTER — Ambulatory Visit (HOSPITAL_COMMUNITY): Payer: Medicare Other | Admitting: Anesthesiology

## 2013-10-25 ENCOUNTER — Ambulatory Visit (HOSPITAL_COMMUNITY)
Admission: RE | Admit: 2013-10-25 | Discharge: 2013-10-25 | Disposition: A | Discharge status: Home / Self Care | Payer: Medicare Other | Source: Ambulatory Visit | Attending: Cardiothoracic Surgery | Admitting: Cardiothoracic Surgery

## 2013-10-25 ENCOUNTER — Ambulatory Visit (HOSPITAL_COMMUNITY): Payer: Medicare Other

## 2013-10-25 ENCOUNTER — Encounter (HOSPITAL_COMMUNITY): Payer: Self-pay | Admitting: Critical Care Medicine

## 2013-10-25 ENCOUNTER — Encounter (HOSPITAL_COMMUNITY): Payer: Medicare Other | Admitting: Anesthesiology

## 2013-10-25 ENCOUNTER — Encounter (HOSPITAL_COMMUNITY)
Admission: RE | Disposition: A | Discharge status: Home / Self Care | Payer: Self-pay | Source: Ambulatory Visit | Attending: Cardiothoracic Surgery

## 2013-10-25 DIAGNOSIS — Z888 Allergy status to other drugs, medicaments and biological substances status: Secondary | ICD-10-CM | POA: Diagnosis not present

## 2013-10-25 DIAGNOSIS — C3430 Malignant neoplasm of lower lobe, unspecified bronchus or lung: Secondary | ICD-10-CM | POA: Diagnosis not present

## 2013-10-25 DIAGNOSIS — F17200 Nicotine dependence, unspecified, uncomplicated: Secondary | ICD-10-CM | POA: Diagnosis not present

## 2013-10-25 DIAGNOSIS — F329 Major depressive disorder, single episode, unspecified: Secondary | ICD-10-CM | POA: Insufficient documentation

## 2013-10-25 DIAGNOSIS — K589 Irritable bowel syndrome without diarrhea: Secondary | ICD-10-CM | POA: Diagnosis not present

## 2013-10-25 DIAGNOSIS — F411 Generalized anxiety disorder: Secondary | ICD-10-CM | POA: Insufficient documentation

## 2013-10-25 DIAGNOSIS — G8929 Other chronic pain: Secondary | ICD-10-CM | POA: Insufficient documentation

## 2013-10-25 DIAGNOSIS — F3289 Other specified depressive episodes: Secondary | ICD-10-CM | POA: Diagnosis not present

## 2013-10-25 DIAGNOSIS — J449 Chronic obstructive pulmonary disease, unspecified: Secondary | ICD-10-CM | POA: Insufficient documentation

## 2013-10-25 DIAGNOSIS — R222 Localized swelling, mass and lump, trunk: Secondary | ICD-10-CM

## 2013-10-25 DIAGNOSIS — Z885 Allergy status to narcotic agent status: Secondary | ICD-10-CM | POA: Insufficient documentation

## 2013-10-25 DIAGNOSIS — K219 Gastro-esophageal reflux disease without esophagitis: Secondary | ICD-10-CM | POA: Insufficient documentation

## 2013-10-25 DIAGNOSIS — C679 Malignant neoplasm of bladder, unspecified: Secondary | ICD-10-CM | POA: Insufficient documentation

## 2013-10-25 DIAGNOSIS — J4489 Other specified chronic obstructive pulmonary disease: Secondary | ICD-10-CM | POA: Insufficient documentation

## 2013-10-25 DIAGNOSIS — R918 Other nonspecific abnormal finding of lung field: Secondary | ICD-10-CM

## 2013-10-25 HISTORY — DX: Gastro-esophageal reflux disease without esophagitis: K21.9

## 2013-10-25 HISTORY — DX: Shortness of breath: R06.02

## 2013-10-25 HISTORY — DX: Anxiety disorder, unspecified: F41.9

## 2013-10-25 HISTORY — DX: Calculus of kidney: N20.0

## 2013-10-25 HISTORY — DX: Malignant (primary) neoplasm, unspecified: C80.1

## 2013-10-25 HISTORY — DX: Major depressive disorder, single episode, unspecified: F32.9

## 2013-10-25 HISTORY — DX: Depression, unspecified: F32.A

## 2013-10-25 HISTORY — PX: VIDEO BRONCHOSCOPY WITH ENDOBRONCHIAL NAVIGATION: SHX6175

## 2013-10-25 LAB — PULMONARY FUNCTION TEST
DL/VA % pred: 53 %
DL/VA: 2.41 ml/min/mmHg/L
DLCO unc % pred: 44 %
DLCO unc: 13.12 ml/min/mmHg
FEF 25-75 Post: 1.53 L/sec
FEF 25-75 Pre: 1.07 L/sec
FEF2575-%Change-Post: 42 %
FEF2575-%Pred-Post: 55 %
FEF2575-%Pred-Pre: 39 %
FEV1-%Change-Post: 8 %
FEV1-%Pred-Post: 71 %
FEV1-%Pred-Pre: 66 %
FEV1-Post: 2.38 L
FEV1-Pre: 2.2 L
FEV1FVC-%Change-Post: -4 %
FEV1FVC-%Pred-Pre: 84 %
FEV6-%Change-Post: 12 %
FEV6-%Pred-Post: 90 %
FEV6-%Pred-Pre: 80 %
FEV6-Post: 3.79 L
FEV6-Pre: 3.36 L
FEV6FVC-%Change-Post: 0 %
FEV6FVC-%Pred-Post: 101 %
FEV6FVC-%Pred-Pre: 102 %
FVC-%Change-Post: 13 %
FVC-%Pred-Post: 89 %
FVC-%Pred-Pre: 78 %
FVC-Post: 3.93 L
FVC-Pre: 3.47 L
Post FEV1/FVC ratio: 61 %
Post FEV6/FVC ratio: 97 %
Pre FEV1/FVC ratio: 63 %
Pre FEV6/FVC Ratio: 97 %
RV % pred: 161 %
RV: 3.49 L
TLC % pred: 108 %
TLC: 7.15 L

## 2013-10-25 LAB — PROTIME-INR
INR: 0.92 (ref 0.00–1.49)
Prothrombin Time: 12.4 seconds (ref 11.6–15.2)

## 2013-10-25 LAB — COMPREHENSIVE METABOLIC PANEL
ALT: 8 U/L (ref 0–53)
AST: 16 U/L (ref 0–37)
Albumin: 4.1 g/dL (ref 3.5–5.2)
Alkaline Phosphatase: 83 U/L (ref 39–117)
Anion gap: 12 (ref 5–15)
BUN: 11 mg/dL (ref 6–23)
CO2: 26 mEq/L (ref 19–32)
Calcium: 9.7 mg/dL (ref 8.4–10.5)
Chloride: 101 mEq/L (ref 96–112)
Creatinine, Ser: 0.9 mg/dL (ref 0.50–1.35)
GFR calc Af Amer: >90 mL/min (ref >90)
GFR calc non Af Amer: >90 mL/min (ref >90)
Glucose, Bld: 113 mg/dL — ABNORMAL HIGH (ref 70–99)
Potassium: 4 mEq/L (ref 3.7–5.3)
Sodium: 139 mEq/L (ref 137–147)
Total Bilirubin: 0.4 mg/dL (ref 0.3–1.2)
Total Protein: 7.3 g/dL (ref 6.0–8.3)

## 2013-10-25 LAB — CBC
HCT: 48 % (ref 39.0–52.0)
Hemoglobin: 16.6 g/dL (ref 13.0–17.0)
MCH: 31.4 pg (ref 26.0–34.0)
MCHC: 34.6 g/dL (ref 30.0–36.0)
MCV: 90.7 fL (ref 78.0–100.0)
Platelets: 215 10*3/uL (ref 150–400)
RBC: 5.29 MIL/uL (ref 4.22–5.81)
RDW: 13.6 % (ref 11.5–15.5)
WBC: 9.2 10*3/uL (ref 4.0–10.5)

## 2013-10-25 LAB — APTT: aPTT: 30 seconds (ref 24–37)

## 2013-10-25 SURGERY — VIDEO BRONCHOSCOPY WITH ENDOBRONCHIAL NAVIGATION
Anesthesia: General | Laterality: Right

## 2013-10-25 MED ORDER — LIDOCAINE HCL (CARDIAC) 20 MG/ML IV SOLN
INTRAVENOUS | Status: AC
  Filled 2013-10-25: qty 5

## 2013-10-25 MED ORDER — MIDAZOLAM HCL 2 MG/2ML IJ SOLN
INTRAMUSCULAR | Status: AC
  Filled 2013-10-25: qty 2

## 2013-10-25 MED ORDER — MIDAZOLAM HCL 5 MG/5ML IJ SOLN
INTRAMUSCULAR | Status: DC | PRN
  Administered 2013-10-25: 2 mg via INTRAVENOUS

## 2013-10-25 MED ORDER — LIDOCAINE HCL (CARDIAC) 20 MG/ML IV SOLN
INTRAVENOUS | Status: DC | PRN
  Administered 2013-10-25: 100 mg via INTRAVENOUS

## 2013-10-25 MED ORDER — ALBUTEROL SULFATE (2.5 MG/3ML) 0.083% IN NEBU
2.5000 mg | INHALATION_SOLUTION | Freq: Once | RESPIRATORY_TRACT | Status: AC
  Administered 2013-10-25: 2.5 mg via RESPIRATORY_TRACT

## 2013-10-25 MED ORDER — FENTANYL CITRATE 0.05 MG/ML IJ SOLN
INTRAMUSCULAR | Status: AC
  Administered 2013-10-25: 100 ug
  Filled 2013-10-25: qty 2

## 2013-10-25 MED ORDER — MEPERIDINE HCL 25 MG/ML IJ SOLN: 6.2500 mg | INTRAMUSCULAR | Status: DC | PRN

## 2013-10-25 MED ORDER — FENTANYL CITRATE 0.05 MG/ML IJ SOLN
INTRAMUSCULAR | Status: DC | PRN
  Administered 2013-10-25 (×2): 50 ug via INTRAVENOUS

## 2013-10-25 MED ORDER — GLYCOPYRROLATE 0.2 MG/ML IJ SOLN
INTRAMUSCULAR | Status: DC | PRN
  Administered 2013-10-25: .4 mg via INTRAVENOUS

## 2013-10-25 MED ORDER — ROCURONIUM BROMIDE 50 MG/5ML IV SOLN
INTRAVENOUS | Status: AC
  Filled 2013-10-25: qty 1

## 2013-10-25 MED ORDER — DEXAMETHASONE SODIUM PHOSPHATE 4 MG/ML IJ SOLN
INTRAMUSCULAR | Status: DC | PRN
  Administered 2013-10-25: 4 mg via INTRAVENOUS

## 2013-10-25 MED ORDER — DEXAMETHASONE SODIUM PHOSPHATE 4 MG/ML IJ SOLN
INTRAMUSCULAR | Status: AC
  Filled 2013-10-25: qty 1

## 2013-10-25 MED ORDER — PROPOFOL 10 MG/ML IV BOLUS
INTRAVENOUS | Status: AC
  Filled 2013-10-25: qty 20

## 2013-10-25 MED ORDER — PROPOFOL 10 MG/ML IV BOLUS
INTRAVENOUS | Status: DC | PRN
  Administered 2013-10-25: 120 mg via INTRAVENOUS
  Administered 2013-10-25: 30 mg via INTRAVENOUS
  Administered 2013-10-25: 20 mg via INTRAVENOUS

## 2013-10-25 MED ORDER — LACTATED RINGERS IV SOLN
INTRAVENOUS | Status: DC
  Administered 2013-10-25 (×2): via INTRAVENOUS

## 2013-10-25 MED ORDER — ROCURONIUM BROMIDE 100 MG/10ML IV SOLN
INTRAVENOUS | Status: DC | PRN
  Administered 2013-10-25: 40 mg via INTRAVENOUS

## 2013-10-25 MED ORDER — FENTANYL CITRATE 0.05 MG/ML IJ SOLN: 25.0000 ug | INTRAMUSCULAR | Status: DC | PRN

## 2013-10-25 MED ORDER — ONDANSETRON HCL 4 MG/2ML IJ SOLN
INTRAMUSCULAR | Status: DC | PRN
  Administered 2013-10-25: 4 mg via INTRAVENOUS

## 2013-10-25 MED ORDER — FENTANYL CITRATE 0.05 MG/ML IJ SOLN
INTRAMUSCULAR | Status: AC
  Filled 2013-10-25: qty 5

## 2013-10-25 MED ORDER — EPHEDRINE SULFATE 50 MG/ML IJ SOLN
INTRAMUSCULAR | Status: DC | PRN
  Administered 2013-10-25: 5 mg via INTRAVENOUS

## 2013-10-25 MED ORDER — 0.9 % SODIUM CHLORIDE (POUR BTL) OPTIME
TOPICAL | Status: DC | PRN
  Administered 2013-10-25: 1000 mL

## 2013-10-25 MED ORDER — PROMETHAZINE HCL 25 MG/ML IJ SOLN: 6.2500 mg | INTRAMUSCULAR | Status: DC | PRN

## 2013-10-25 MED ORDER — DEXTROSE 5 % IV SOLN
1.5000 g | INTRAVENOUS | Status: AC
  Administered 2013-10-25: 1.5 g via INTRAVENOUS
  Filled 2013-10-25: qty 1.5

## 2013-10-25 MED ORDER — NEOSTIGMINE METHYLSULFATE 10 MG/10ML IV SOLN
INTRAVENOUS | Status: DC | PRN
  Administered 2013-10-25: 3 mg via INTRAVENOUS

## 2013-10-25 SURGICAL SUPPLY — 30 items
BRUSH SUPERTRAX BIOPSY (INSTRUMENTS) IMPLANT
BRUSH SUPERTRAX NDL-TIP CYTO (INSTRUMENTS) ×2 IMPLANT
CANISTER SUCTION 2500CC (MISCELLANEOUS) ×2 IMPLANT
CHANNEL WORK EXTEND EDGE 180 (KITS) IMPLANT
CHANNEL WORK EXTEND EDGE 45 (KITS) IMPLANT
CHANNEL WORK EXTEND EDGE 90 (KITS) IMPLANT
CONT SPEC 4OZ CLIKSEAL STRL BL (MISCELLANEOUS) ×4 IMPLANT
COVER TABLE BACK 60X90 (DRAPES) ×2 IMPLANT
DRSG AQUACEL AG ADV 3.5X14 (GAUZE/BANDAGES/DRESSINGS) ×2 IMPLANT
FILTER STRAW FLUID ASPIR (MISCELLANEOUS) IMPLANT
FORCEPS BIOP SUPERTRX PREMAR (INSTRUMENTS) ×2 IMPLANT
GAUZE SPONGE 4X4 12PLY STRL (GAUZE/BANDAGES/DRESSINGS) ×2 IMPLANT
GLOVE BIO SURGEON STRL SZ 6.5 (GLOVE) ×2 IMPLANT
GLOVE BIOGEL PI IND STRL 6.5 (GLOVE) ×2 IMPLANT
GLOVE BIOGEL PI INDICATOR 6.5 (GLOVE) ×2
KIT PROCEDURE EDGE 180 (KITS) IMPLANT
KIT PROCEDURE EDGE 45 (KITS) IMPLANT
KIT PROCEDURE EDGE 90 (KITS) ×2 IMPLANT
KIT ROOM TURNOVER OR (KITS) ×2 IMPLANT
MARKER SKIN DUAL TIP RULER LAB (MISCELLANEOUS) ×2 IMPLANT
NEEDLE SUPERTRX PREMARK BIOPSY (NEEDLE) ×2 IMPLANT
NS IRRIG 1000ML POUR BTL (IV SOLUTION) ×2 IMPLANT
OIL SILICONE PENTAX (PARTS (SERVICE/REPAIRS)) ×2 IMPLANT
PAD ARMBOARD 7.5X6 YLW CONV (MISCELLANEOUS) ×4 IMPLANT
PATCHES PATIENT (LABEL) ×6 IMPLANT
SYR 20CC LL (SYRINGE) ×2 IMPLANT
SYR 20ML ECCENTRIC (SYRINGE) ×2 IMPLANT
TOWEL OR 17X24 6PK STRL BLUE (TOWEL DISPOSABLE) ×2 IMPLANT
TRAP SPECIMEN MUCOUS 40CC (MISCELLANEOUS) ×2 IMPLANT
TUBE CONNECTING 20X1/4 (TUBING) ×2 IMPLANT

## 2013-10-25 NOTE — Anesthesia Procedure Notes (Signed)
Procedure Name: Intubation Date/Time: 10/25/2013 2:22 PM Performed by: Carola Frost Pre-anesthesia Checklist: Patient identified, Timeout performed, Emergency Drugs available, Suction available and Patient being monitored Patient Re-evaluated:Patient Re-evaluated prior to inductionOxygen Delivery Method: Circle system utilized Preoxygenation: Pre-oxygenation with 100% oxygen Intubation Type: IV induction Ventilation: Mask ventilation without difficulty Laryngoscope Size: Mac and 4 Grade View: Grade I Tube type: Oral Tube size: 8.5 mm Number of attempts: 1 Airway Equipment and Method: Stylet Placement Confirmation: CO2 detector,  positive ETCO2,  ETT inserted through vocal cords under direct vision and breath sounds checked- equal and bilateral Secured at: 23 cm Tube secured with: Tape Dental Injury: Teeth and Oropharynx as per pre-operative assessment

## 2013-10-25 NOTE — H&P (Signed)
[address]       [place] [address]             [place] Record #[id number] Date of Birth: 28-Mar-1950  Referring:Dr Luan Pulling Primary Care: [name], MD  Chief Complaint:  Chest x-ray suspicious for left lung cancer, biopsy proven bladder cancer.  History of Present Illness:    Brian Sims 63 y.o. male is seen in the office   for evaluation of a left lung mass. The patient and his wife note that he's been "sick" for least 4 years, debilitated by irritable  bowel disease and chronic pain. He notes that he spends more than 80% of his time in bed. She notes that in the last year he's been weak but only on 2 occasions has he walked in [place]. He began having increasing lower pelvic pain and bladder problems and was recently diagnosed by Dr.  Jeffie Pollock, with bladder cancer. A radical cystectomy is being contemplated. A CT scan of the chest followed by PET scan confirmed a left lung mass just under 3 cm in size.   The patient is a long-term smoker more than 45 years and continues to smoke daily.      Current Activity/ Functional Status:  Patient was independent with mobility/ambulation, transfers, ADL's, IADL's.    Zubrod Score: At the time of surgery this patient's most appropriate activity status/level should be described as: []     0    Normal activity, no symptoms []     1    Restricted in physical strenuous activity but ambulatory, able to do out light work []     2    Ambulatory and capable of self care, unable to do work activities, up and about               >50 % of waking hours                              [x]     3    Only limited self care, in bed greater than 50% of waking hours []     4    Completely disabled, no self care, confined to bed or chair []     5    Moribund   Past Medical History  Diagnosis Date  . COPD (chronic obstructive pulmonary disease)   . Inflammatory bowel disease     . Constipation   . Lesion of bladder   . Prostate mass   . Dysuria-frequency syndrome     wears depends  . Arthritis   . Shortness of breath   . Cancer   . Depression   . Anxiety   . Kidney stones   . GERD (gastroesophageal reflux disease)     Past Surgical History  Procedure Laterality Date  . Back surgery    . Left shoulder      arthroscopy  . Rt knee arthroscopy    . Colonoscopy N/A 01/11/2013    Procedure: COLONOSCOPY;  Surgeon: [name], MD;  [facility];  Service: Endoscopy;  Laterality: N/A;  340  . Carpal tunnel release Right   . Transurethral resection of bladder tumor N/A 09/21/2013    Procedure: TRANSURETHRAL RESECTION  PROSTATE  AND BLADDER ;  Surgeon: [name], MD;  Location: [facility];  Service: Urology;  Laterality: N/A;    Family History: Patient's mother died at 14 with MRSA father died of myocardial infarction   History   Social History  . Marital Status: Married    Spouse Name: N/A    Number of Children: N/A  . Years of Education: N/A   Occupational History  . Not on file.   Social History Main Topics  . Smoking status: Current Every Day Smoker -- 1.00 packs/day for 40 years    Types: Cigarettes  . Smokeless tobacco: Never Used     Comment: 1 pack a day since age 45  . Alcohol Use: No  . Drug Use: No  . Sexual Activity: Not on file   Other Topics Concern  . Not on file   Social History Narrative  . No narrative on file    History  Smoking status  . Current Every Day Smoker -- 1.00 packs/day for 40 years  . Types: Cigarettes  Smokeless tobacco  . Never Used    Comment: 1 pack a day since age 86    History  Alcohol Use No     Allergies  Allergen Reactions  . Lyrica [Pregabalin] Nausea And Vomiting and Other (See Comments)    Palpitations, dizziness  . Gabapentin Nausea And Vomiting, Palpitations and Other (See Comments)    Dizziness, sweating,   . Morphine And Related Palpitations and  Other (See Comments)    Lightheaded, dizziness, nausea    Current Facility-Administered Medications  Medication Dose Route Frequency Provider Last Rate Last Dose  . lactated ringers infusion   Intravenous Continuous [name], MD 50 mL/hr at 10/25/13 1242       Review of Systems:     Cardiac Review of Systems: Y or N  Chest Pain [  y  ]  Resting SOB [ y  ] Exertional SOB  Blue.Reese  ]  Orthopnea [ y ]   Pedal Edema [ y  ]    Palpitations Blue.Reese  ] Syncope  [ n ]   Presyncope [ y  ]  General Review of Systems: [Y] = yes [  ]=no Constitional: recent weight change [  ];  Wt loss over the last 3 months [ y  ] anorexia [ y ]; fatigue [ y ]; nausea [ y ]; night sweats [  ]; fever [  ]; or chills [  ];          Dental: poor dentition[  ]; Last Dentist visit:   Eye : blurred vision [  ]; diplopia [   ]; vision changes [  ];  Amaurosis fugax[  ]; Resp: cough [  ];  wheezing[  ];  hemoptysis[  ]; shortness of breath[  ]; paroxysmal nocturnal dyspnea[  ]; dyspnea on exertion[  ]; or orthopnea[  ];  GI:  gallstones[  ], vomiting[  ];  dysphagia[  ]; melena[  ];  hematochezia [  ]; heartburn[  ];   Hx of  Colonoscopy[  ]; GU: kidney stones Blue.Reese  ]; hematuria[y  ];   dysuria [ y ];  nocturia[y  ];  history of     obstruction [  y]; urinary frequency [ y ]             Skin: rash, swelling[  ];, hair loss[  ];  peripheral edema[  ];  or itching[  ]; Musculosketetal: myalgias[  ];  joint swelling[  ];  joint erythema[  ];  joint pain[  ];  back pain[  ];  Heme/Lymph: bruising[  ];  bleeding[  ];  anemia[  ];  Neuro: TIA[  ];  headaches[  ];  stroke[ n ];  vertigo[y  ];  seizures[n  ];   paresthesias[  y];  difficulty walking[y  ];  Psych:depression[y  ]; anxiety[y  ];  Endocrine: diabetes[  ];  thyroid dysfunction[  ];  Immunizations: Flu up to date [  ]; Pneumococcal up to date [  ];  Other:  Physical Exam: BP 128/98  Pulse 95  Temp(Src) 98.3 F (36.8 C) (Oral)  Resp 18  Ht 5\' 9"  (1.753 m)  Wt 128  lb (58.06 kg)  BMI 18.89 kg/m2  SpO2 98%  PHYSICAL EXAMINATION:  General appearance: alert, cooperative, appears older than stated age and fatigued Neurologic: intact Heart: regular rate and rhythm, S1, S2 normal, no murmur, click, rub or gallop Lungs: diminished breath sounds bibasilar Abdomen: soft, non-tender; bowel sounds normal; no masses,  no organomegaly Extremities: extremities normal, atraumatic, no cyanosis or edema and Homans sign is negative, no sign of DVT The patient has no carotid bruits, he has palpable DP and PT pulses mild pedal edema bilaterally  Diagnostic Studies & Laboratory data:     Recent Radiology Findings:   Dg Chest 2 View  10/04/2013   CLINICAL DATA:  Bladder cancer.  EXAM: CHEST  2 VIEW  COMPARISON:  CT chest 12/29/2011 and chest radiograph 06/06/2009.  FINDINGS: Trachea is midline. Heart size normal. Left hilum may be minimally enlarged. An ovoid mass in the left lower lobe measures 2.8 x 3.4 cm. Lungs are hyperinflated. No pleural fluid.  IMPRESSION: Left lower lobe mass is highly worrisome for primary bronchogenic carcinoma. Solitary metastasis is considered less likely. CT chest with contrast is recommended in further evaluation. These results will be called to the ordering clinician or representative by the Radiologist Assistant, and communication documented in the PACS or zVision Dashboard.   Electronically Signed   By: Lorin Picket M.D.   On: 10/04/2013 08:17   Ct Chest W Contrast  10/05/2013   CLINICAL DATA:  Evaluate lung mass  EXAM: CT CHEST WITH CONTRAST  TECHNIQUE: Multidetector CT imaging of the chest was performed during intravenous contrast administration.  CONTRAST:  78mL OMNIPAQUE IOHEXOL 300 MG/ML  SOLN  COMPARISON:  12/29/2011  FINDINGS: No pleural effusion. There is no airspace consolidation identified. Mild changes of centrilobular emphysema. Left lower lobe lung mass measures 2.9 x 2.5 cm, image 35/series 3. 3 mm nodule is identified within  the right lower lobe, image 49/series 3. There is also a 3 mm right lower lobe nodule, image 47/series 3. Nonspecific right upper lobe nodule measures 3 mm, image 20/series 3.  The heart size is normal. 9 mm left paratracheal lymph node is identified, image 22/series 2. No enlarged right paratracheal or right hilar lymph nodes. No supraclavicular or axillary adenopathy. Atherosclerotic disease involves the thoracic aorta as well as the LAD, left circumflex and RCA coronary arteries.  7 mm low attenuation structure is identified within the left hepatic lobe, image 54/series 2. This is unchanged from previous exam and is likely a benign abnormality. The adrenal glands both appear normal.  Review of the visualized osseous structures is significant for mild spondylosis within the lower thoracic spine. There are no aggressive lytic or sclerotic bone lesions identified.  IMPRESSION: 1. Left lower lobe lung mass is worrisome for primary bronchogenic carcinoma. Further evaluation with  PET-CT and tissue sampling is advised. 2. Prominent but non pathologically enlarged left paratracheal lymph node measures 9 mm. Attention in this lymph node on PET-CT is recommended 3. Emphysema 4. Atherosclerotic disease including 3 vessel coronary artery calcifications.   Electronically Signed   By: Kerby Moors M.D.   On: 10/05/2013 17:23   Nm Pet Image Initial (pi) Skull Base To Thigh  10/13/2013   CLINICAL DATA:  Initial treatment strategy for lung mass.  EXAM: NUCLEAR MEDICINE PET SKULL BASE TO THIGH  TECHNIQUE: 6.6 MCi F-18 FDG was injected intravenously. Full-ring PET imaging was performed from the skull base to thigh after the radiotracer. CT data was obtained and used for attenuation correction and anatomic localization.  FASTING BLOOD GLUCOSE:  Value: 94 mg/dl  COMPARISON:  CT chest from 10/05/2013  FINDINGS: NECK  No hypermetabolic lymph nodes in the neck.  CHEST  A 2.9 x 2.5 cm left lower lobe mass seen on the recent chest CT  is markedly hypermetabolic with SUV max = 70.3. Multiple tiny right lung nodules were seen on the previous CT is well. These do not show FDG uptake but are below the accepted size threshold for reliable resolution on PET imaging.  No hypermetabolic uptake in the mediastinum or either hilum.  ABDOMEN/PELVIS  No abnormal hypermetabolic activity within the liver, pancreas, adrenal glands, or spleen. No hypermetabolic lymph nodes in the abdomen or pelvis.  Tiny low-density foci in the liver are likely related the cysts. 5 x 7 mm nonobstructing stone is identified in the interpolar left kidney. Atherosclerotic calcification noted in the abdominal aorta without aneurysm.  SKELETON  No focal hypermetabolic activity to suggest skeletal metastasis.  IMPRESSION: Hypermetabolic left lower lobe pulmonary mass compatible with malignancy. No evidence for malignant range uptake in hilar or mediastinal lymph nodes. No evidence for distant hypermetabolic metastases.   Electronically Signed   By: Misty Stanley M.D.   On: 10/13/2013 14:35      Recent Lab Findings: Lab Results  Component Value Date   WBC 9.2 10/25/2013   HGB 16.6 10/25/2013   HCT 48.0 10/25/2013   PLT 215 10/25/2013   GLUCOSE 113* 10/25/2013   ALT 8 10/25/2013   AST 16 10/25/2013   NA 139 10/25/2013   K 4.0 10/25/2013   CL 101 10/25/2013   CREATININE 0.90 10/25/2013   BUN 11 10/25/2013   CO2 26 10/25/2013   INR 0.92 10/25/2013   09/21/2013 Diagnosis 1. Urethra, biopsy - HIGH GRADE UROTHELIAL CARCINOMA, SEE COMMENT. - MUSCULARIS PROPRIA IS PRESENT AND UNINVOLVED. 2. Bladder, biopsy, wall - HIGH GRADE UROTHELIAL CARCINOMA, SEE COMMENT. - NO MUSCULARIS PROPRIA PRESENT.   Assessment / Plan:   Clinical stage T1b,N0,M0  Stage Ia, with very limited physical reserve, full PFTs are pending. HIGH GRADE UROTHELIAL CARCINOMA- radical cystectomy is being considered. Marland Kitchen COPD (chronic obstructive pulmonary disease)   . Inflammatory bowel disease   . Constipation    . Lesion of bladder   . Prostate mass   . Dysuria-frequency syndrome     wears depends  . Arthritis     I recommended to the patient we proceed with obtaining full pulmonary function studies, but taking a history from he and his wife he has a very low functional level. We plan tomorrowbronchoscopy with enb to obtain a tissue diagnosis, and obtain full pulmonary function studies. After this information obtained make a decision about suitability for resection versus treatment with radiation therapy.   The goals risks and alternatives of the  planned surgical procedure navigational bronchoscopy with biopsy  have been discussed with the patient in detail. The risks of the procedure including death, infection, stroke, myocardial infarction, bleeding, blood transfusion have all been discussed specifically.  I have quoted Brian Sims a 5 % of perioperative mortality and a complication rate as high as 20 %. The patient's questions have been answered.Brian Sims is willing  to proceed with the planned procedure.   [name] MD      [address] [place] [address] Office [phone]   Beeper [phone]  10/25/2013 1:56 PM

## 2013-10-25 NOTE — Anesthesia Postprocedure Evaluation (Signed)
Anesthesia Post Note  Patient: Brian Sims  Procedure(s) Performed: Procedure(s) (LRB): VIDEO BRONCHOSCOPY WITH ENDOBRONCHIAL NAVIGATION (Right)  Anesthesia type: general  Patient location: PACU  Post pain: Pain level controlled  Post assessment: Patient's Cardiovascular Status Stable  Last Vitals:  Filed Vitals:   10/25/13 1715  BP:   Pulse: 71  Temp: 37.1 C  Resp: 16    Post vital signs: Reviewed and stable  Level of consciousness: sedated  Complications: No apparent anesthesia complications

## 2013-10-25 NOTE — Brief Op Note (Signed)
      [address]       [place] [address]             [phone]      10/25/2013  4:11 PM  PATIENT:  Brian Sims  63 y.o. male  PRE-OPERATIVE DIAGNOSIS:   left lower lobe lung mass  POST-OPERATIVE DIAGNOSIS:  left lower lobe lung mass- non small cell cancer by cytology  PROCEDURE:  Procedure(s): VIDEO BRONCHOSCOPY WITH ENDOBRONCHIAL NAVIGATION (Right)  SURGEON:  Surgeon(s) and Role:    * [name], MD - Primary    ANESTHESIA:   general  EBL:     BLOOD ADMINISTERED:none  DRAINS: none   LOCAL MEDICATIONS USED:  LIDOCAINE  and NONE  SPECIMEN:  Source of Specimen:  left lower lobe  DISPOSITION OF SPECIMEN:  PATHOLOGY  COUNTS:  YES   DICTATION: .Dragon Dictation  PLAN OF CARE: Discharge to home after PACU  PATIENT DISPOSITION:  PACU - hemodynamically stable.   Delay start of Pharmacological VTE agent (>24hrs) due to surgical blood loss or risk of bleeding: yes

## 2013-10-25 NOTE — Discharge Instructions (Signed)
Flexible Bronchoscopy, Care After Refer to this sheet in the next few weeks. These instructions provide you with information on caring for yourself after your procedure. Your health care provider may also give you more specific instructions. Your treatment has been planned according to current medical practices, but problems sometimes occur. Call your health care provider if you have any problems or questions after your procedure.  WHAT TO EXPECT AFTER THE PROCEDURE It is normal to have the following symptoms for 24-48 hours after the procedure:   Increased cough.  Low-grade fever.  Sore throat or hoarse voice.  Small streaks of blood in your thick spit (sputum) if tissue samples were taken (biopsy). HOME CARE INSTRUCTIONS   Do not eat or drink anything for 2 hours after your procedure. Your nose and throat were numbed by medicine. If you try to eat or drink before the medicine wears off, food or drink could go into your lungs or you could burn yourself. After the numbness is gone and your cough and gag reflexes have returned, you may eat soft food and drink liquids slowly.   The day after the procedure, you can go back to your normal diet.   You may resume normal activities.   Keep all follow-up visits as directed by your health care provider. It is important to keep all your appointments, especially if tissue samples were taken for testing (biopsy). SEEK IMMEDIATE MEDICAL CARE IF:   You have increasing shortness of breath.   You become light-headed or faint.   You have chest pain.   You have any new concerning symptoms.  You cough up more than a small amount of blood.  The amount of blood you cough up increases. MAKE SURE YOU:  Understand these instructions.  Will watch your condition.  Will get help right away if you are not doing well or get worse. Document Released: 08/15/2004 Document Revised: 06/12/2013 Document Reviewed: 09/30/2012 [facility] Patient Information  2015 [place], [place]. This information is not intended to replace advice given to you by your health care provider. Make sure you discuss any questions you have with your health care provider.  What to eat:  For your first meals, you should eat lightly; only small meals initially.  If you do not have nausea, you may eat larger meals.  Avoid spicy, greasy and heavy food.    General Anesthesia, Adult, Care After  Refer to this sheet in the next few weeks. These instructions provide you with information on caring for yourself after your procedure. Your health care provider may also give you more specific instructions. Your treatment has been planned according to current medical practices, but problems sometimes occur. Call your health care provider if you have any problems or questions after your procedure.  WHAT TO EXPECT AFTER THE PROCEDURE  After the procedure, it is typical to experience:  Sleepiness.  Nausea and vomiting. HOME CARE INSTRUCTIONS  For the first 24 hours after general anesthesia:  Have a responsible person with you.  Do not drive a car. If you are alone, do not take public transportation.  Do not drink alcohol.  Do not take medicine that has not been prescribed by your health care provider.  Do not sign important papers or make important decisions.  You may resume a normal diet and activities as directed by your health care provider.  Change bandages (dressings) as directed.  If you have questions or problems that seem related to general anesthesia, call the hospital and ask for  the anesthetist or anesthesiologist on call. SEEK MEDICAL CARE IF:  You have nausea and vomiting that continue the day after anesthesia.  You develop a rash. SEEK IMMEDIATE MEDICAL CARE IF:  You have difficulty breathing.  You have chest pain.  You have any allergic problems. Document Released: 05/04/2000 Document Revised: 09/28/2012 Document Reviewed: 08/11/2012  [facility] Patient Information  2014 [place], [place].

## 2013-10-25 NOTE — Anesthesia Preprocedure Evaluation (Addendum)
Anesthesia Evaluation  Patient identified by MRN, date of birth, ID band Patient awake    Reviewed: Allergy & Precautions, H&P , NPO status , Patient's Chart, lab work & pertinent test results  Airway Mallampati: III      Dental  (+) Edentulous Upper, Dental Advisory Given   Pulmonary COPD COPD inhaler, Current Smoker,  + rhonchi         Cardiovascular + Past MI Rhythm:Regular Rate:Normal     Neuro/Psych Carotids with minimal stenosis    GI/Hepatic GERD-  Medicated,  Endo/Other    Renal/GU      Musculoskeletal   Abdominal (+)  Abdomen: soft.    Peds  Hematology   Anesthesia Other Findings   Reproductive/Obstetrics                         Anesthesia Physical Anesthesia Plan  ASA: III  Anesthesia Plan: General ETT   Post-op Pain Management:    Induction: Intravenous  Airway Management Planned:   Additional Equipment:   Intra-op Plan:   Post-operative Plan:   Informed Consent: I have reviewed the patients History and Physical, chart, labs and discussed the procedure including the risks, benefits and alternatives for the proposed anesthesia with the patient or authorized representative who has indicated his/her understanding and acceptance.     Plan Discussed with:   Anesthesia Plan Comments:        Anesthesia Quick Evaluation

## 2013-10-25 NOTE — Transfer of Care (Signed)
Immediate Anesthesia Transfer of Care Note  Patient: Brian Sims  Procedure(s) Performed: Procedure(s): VIDEO BRONCHOSCOPY WITH ENDOBRONCHIAL NAVIGATION (Right)  Patient Location: PACU  Anesthesia Type:General  Level of Consciousness: awake, alert  and oriented  Airway & Oxygen Therapy: Patient Spontanous Breathing  Post-op Assessment: Post -op Vital signs reviewed and stable  Post vital signs: Reviewed and stable  Complications: No apparent anesthesia complications

## 2013-10-26 ENCOUNTER — Telehealth: Payer: Self-pay | Admitting: *Deleted

## 2013-10-26 ENCOUNTER — Ambulatory Visit: Payer: Medicare Other

## 2013-10-26 ENCOUNTER — Ambulatory Visit: Payer: Medicare Other | Admitting: Oncology

## 2013-10-26 ENCOUNTER — Other Ambulatory Visit: Payer: Medicare Other

## 2013-10-26 ENCOUNTER — Encounter (HOSPITAL_COMMUNITY): Payer: Medicare Other

## 2013-10-26 ENCOUNTER — Encounter (HOSPITAL_COMMUNITY): Payer: Self-pay | Admitting: Cardiothoracic Surgery

## 2013-10-26 NOTE — Telephone Encounter (Signed)
Called patient with appt to see Radiation Oncology at thoracic clinic on 11/02/13 arrival at 2:15.  He verbalized understanding of appt time and place.

## 2013-10-27 NOTE — Op Note (Unsigned)
NAMEMarland Kitchen  Brian Sims, Brian Sims NO.:  [id number]  MEDICAL RECORD NO.:  [id number]  LOCATION:                                 FACILITY:  PHYSICIAN:  Lanelle Bal, MD    DATE OF BIRTH:  [birth year]  DATE OF PROCEDURE:  10/25/2013 DATE OF DISCHARGE:  10/25/2013                              OPERATIVE REPORT   PREOPERATIVE DIAGNOSIS:  Left lower lobe lung mass.  POSTOPERATIVE DIAGNOSIS:  Left lower lobe lung mass, non-small cell lung cancer by initial preliminary cytology.  PROCEDURE PERFORMED:  Bronchoscopy with electromagnetic navigation bronchoscopy.  SURGEON:  Lanelle Bal, MD  BRIEF HISTORY:  The patient is a 63 year old male who is in a very debilitated condition having been basically bed ridden for the last 4 years with "irritable bowel syndrome."  The patient is a long-term smoker and continues to smoke heavily.  He was found to have a urethral/bladder cancer and was being considered for radical cystectomy and prostatectomy with ileal loop.  A chest x-ray was obtained which showed a left lower lobe lung mass just under 3 cm in size.  This was confirmed by CT scan and PET scan.  There was no evidence of mediastinal involvement.  The patient was seen in the office.  Pulmonary function studies revealed an adequate FEV1 greater than 2 L/minute 66% of expected with the severe diffusion deficit.  As recommended to the patient that we obtained a tissue diagnosis prior to making any as it is unlikely in his frail condition, he would tolerate a formal lobectomy. This mass in the lung would not be amenable to wedge resection because of its more central location.  Risks and options of obtaining a tissue diagnosis were discussed with the patient and his wife in detail and we decided to proceed with bronchoscopy with E and B to obtain tissue diagnosis.  The patient agreed and signed informed consent.  DESCRIPTION OF PROCEDURE:  The patient underwent general  endotracheal anesthesia without incident.  Appropriate time-out was performed.  A 2.8 mm fiberoptic bronchoscope was passed to the subsegmental level without evidence of definite endobronchial lesion.  The scope was then positioned in the working channel and sensing tip were placed and the registration bronchoscopy was performed.  We then used the previously created navigation plan to direct to the left lower lobe toward the superior segment and identified the target of needle aspiration. Several passes with needle aspiration were performed and then a needle brush.  An additional multiple small biopsy fragments were also obtained.  The initial smear of the aspirations confirmed probable non- small cell lung cancer.  Final path is pending.  At the completion of the procedure, tracheobronchial tree was clear of secretions.  The patient was extubated in the operating room and transferred to the recovery room for further postoperative care.  The patient tolerated the procedure without obvious complication.     Lanelle Bal, MD     EG/MEDQ  D:  10/26/2013  T:  10/27/2013  Job:  [id number]

## 2013-11-02 ENCOUNTER — Ambulatory Visit
Admission: RE | Admit: 2013-11-02 | Discharge: 2013-11-02 | Disposition: A | Discharge status: Home / Self Care | Payer: Medicare Other | Source: Ambulatory Visit | Attending: Radiation Oncology | Admitting: Radiation Oncology

## 2013-11-02 ENCOUNTER — Encounter: Payer: Self-pay | Admitting: *Deleted

## 2013-11-02 ENCOUNTER — Ambulatory Visit: Payer: Medicare Other | Attending: Physical Therapy | Admitting: Physical Therapy

## 2013-11-02 VITALS — BP 109/76 | HR 100 | Temp 98.6°F | Resp 15 | Ht 69.02 in | Wt 127.0 lb

## 2013-11-02 DIAGNOSIS — C3492 Malignant neoplasm of unspecified part of left bronchus or lung: Secondary | ICD-10-CM | POA: Insufficient documentation

## 2013-11-02 DIAGNOSIS — C679 Malignant neoplasm of bladder, unspecified: Secondary | ICD-10-CM | POA: Diagnosis not present

## 2013-11-02 DIAGNOSIS — IMO0001 Reserved for inherently not codable concepts without codable children: Secondary | ICD-10-CM | POA: Diagnosis not present

## 2013-11-02 DIAGNOSIS — F17200 Nicotine dependence, unspecified, uncomplicated: Secondary | ICD-10-CM | POA: Diagnosis not present

## 2013-11-02 DIAGNOSIS — C3430 Malignant neoplasm of lower lobe, unspecified bronchus or lung: Secondary | ICD-10-CM

## 2013-11-02 DIAGNOSIS — K589 Irritable bowel syndrome without diarrhea: Secondary | ICD-10-CM | POA: Diagnosis not present

## 2013-11-02 DIAGNOSIS — R5381 Other malaise: Secondary | ICD-10-CM | POA: Diagnosis not present

## 2013-11-02 DIAGNOSIS — C3490 Malignant neoplasm of unspecified part of unspecified bronchus or lung: Secondary | ICD-10-CM | POA: Diagnosis not present

## 2013-11-02 NOTE — Progress Notes (Signed)
Radiation Oncology         [phone]) [phone] ________________________________  Initial outpatient Consultation - Date: 11/02/2013   Name: Brian Sims MRN: [id number]   DOB: [birth year]  REFERRING PHYSICIAN: [name], MD  DIAGNOSIS: Stage I NSCLC   HISTORY OF PRESENT ILLNESS::Brian Sims is a 63 y.o. male  Who presents today with newly diagnosed Stage I lung cancer. He was being worked up for pelvic pain and hematuria when a bladder mass was found. Biopsy revealed bladder cancer and laprascopic surgery was recommended. As aprt of that work up, a CT of the chest was ordered and a 3 cm lesion was noted in the left lower lobe measuring 2.9 cm. Other pulmonary nodules were noted but on review of imaging, these have been stable since 2012. A PET CT revealed the mass to be hypermetabolic with an SUV of 7.8. A biopsy was performed which showed NSCLC, specifically squamous cell cancer. He is chronically ill with a poor performance status. He continues to smoke daily hand has smoked a pack a day for over 45 years. It takes him per him and his wife 3 days to shower, save and get dressed for an appointment. He is not able to ambulate short distances around the house and only eats one meal per day. He is "wiped out" following the appointments he has had over the past few months relating to his cancer diagnosis and has a 45 minute drive to his home from the cancer center.  He is not felt to be a surgical candidate by Dr. Servando Snare and I was asked to evaluate him for the utility of radiation in the management of his newly diagnosed lung cancer. He reports no chest pain, hemoptysis or worsening shortness of breath. He does have anorexia and weight loss. He takes oxycodone for abdominal pain and struggles with constipation after he takes this. He does not believe he would be able to travel daily for radiation treatments. He is accompanied by his son and wife today.   PREVIOUS RADIATION THERAPY: No  PAST  MEDICAL HISTORY:  has a past medical history of COPD (chronic obstructive pulmonary disease); Inflammatory bowel disease; Constipation; Lesion of bladder; Prostate mass; Dysuria-frequency syndrome; Arthritis; Shortness of breath; Cancer; Depression; Anxiety; Kidney stones; and GERD (gastroesophageal reflux disease).    PAST SURGICAL HISTORY: Past Surgical History  Procedure Laterality Date  . Back surgery    . Left shoulder      arthroscopy  . Rt knee arthroscopy    . Colonoscopy N/A 01/11/2013    Procedure: COLONOSCOPY;  Surgeon: [name], MD;  Location: [facility];  Service: Endoscopy;  Laterality: N/A;  340  . Carpal tunnel release Right   . Transurethral resection of bladder tumor N/A 09/21/2013    Procedure: TRANSURETHRAL RESECTION  PROSTATE  AND BLADDER ;  Surgeon: [name], MD;  Location: [facility];  Service: Urology;  Laterality: N/A;  . Video bronchoscopy with endobronchial navigation Right 10/25/2013    Procedure: VIDEO BRONCHOSCOPY WITH ENDOBRONCHIAL NAVIGATION;  Surgeon: [name], MD;  Location: [place];  Service: Thoracic;  Laterality: Right;    FAMILY HISTORY: No family history on file.  SOCIAL HISTORY:  History  Substance Use Topics  . Smoking status: Current Every Day Smoker -- 1.00 packs/day for 40 years    Types: Cigarettes  . Smokeless tobacco: Never Used     Comment: 1 pack a day since age 58  . Alcohol Use: No  ALLERGIES: Lyrica; Gabapentin; and Morphine and related  MEDICATIONS:  Current Outpatient Prescriptions  Medication Sig Dispense Refill  . ALPRAZolam (XANAX) 1 MG tablet Take 0.5 mg by mouth every 2 (two) hours.       . docusate sodium (COLACE) 100 MG capsule Take 200 mg by mouth at bedtime.      . hydrocortisone (PROCTOZONE-HC) 2.5 % rectal cream Place 1 application rectally 3 (three) times daily as needed for hemorrhoids (swelling).      . Linaclotide (LINZESS) 145 MCG CAPS capsule Take 145 mcg by mouth daily.       . mirtazapine (REMERON) 30 MG tablet Take 30 mg by mouth at bedtime.      . Multiple Vitamins-Minerals (MULTIVITAMIN WITH MINERALS) tablet Take 1 tablet by mouth daily. [employer] Multi Vitamin      . omeprazole (PRILOSEC) 20 MG capsule Take 20 mg by mouth 2 (two) times daily before a meal.      . OVER THE COUNTER MEDICATION Take 1 tablet by mouth 2 (two) times daily. Cysta Q      . oxyCODONE-acetaminophen (PERCOCET) 10-325 MG per tablet Take 0.5 tablets by mouth every 2 (two) hours.       . Propylene Glycol (SYSTANE BALANCE OP) Place 1 drop into both eyes daily.      Marland Kitchen tiotropium (SPIRIVA) 18 MCG inhalation capsule Place 18 mcg into inhaler and inhale daily.      . traZODone (DESYREL) 150 MG tablet Take 150 mg by mouth at bedtime.        No current facility-administered medications for this encounter.    REVIEW OF SYSTEMS:  A 15 point review of systems is documented in the electronic medical record. This was obtained by the nursing staff. However, I reviewed this with the patient to discuss relevant findings and make appropriate changes.  Pertinent items are noted in HPI.  PHYSICAL EXAM:  Filed Vitals:   11/02/13 1420  BP: 109/76  Pulse: 100  Temp: 98.6 F (37 C)  Resp: 15  .127 lb (57.607 kg). Pleasant alert male in no distress sitting on the exam room table. Appears tired and older than stated age. Normal respiratory effort. Rhonci bilaterally. With diminished breath sounds bilaterally.   LABORATORY DATA:  Lab Results  Component Value Date   WBC 9.2 10/25/2013   HGB 16.6 10/25/2013   HCT 48.0 10/25/2013   MCV 90.7 10/25/2013   PLT 215 10/25/2013   Lab Results  Component Value Date   NA 139 10/25/2013   K 4.0 10/25/2013   CL 101 10/25/2013   CO2 26 10/25/2013   Lab Results  Component Value Date   ALT 8 10/25/2013   AST 16 10/25/2013   ALKPHOS 83 10/25/2013   BILITOT 0.4 10/25/2013     RADIOGRAPHY: Dg Chest 2 View Within Previous 72 Hours.  Films Obtained On Friday Are  Acceptable For Monday And Tuesday Cases  10/25/2013   CLINICAL DATA:  Lung mass  EXAM: CHEST  2 VIEW  COMPARISON:  Chest radiograph October 03, 2013 and PET/ CT October 13, 2013  FINDINGS: The 3.5 x 3.4 cm mass in the superior segment of the left lower lobe is again noted. No new parenchymal lung mass identified. Elsewhere lungs are clear. No pneumothorax. Heart size and pulmonary vascularity are normal. No adenopathy. No bone lesions.  IMPRESSION: Stable mass in the superior segment of the left lower lobe. No edema or consolidation. No demonstrable pneumothorax.   Electronically Signed   By:  Lowella Grip M.D.   On: 10/25/2013 13:00   Ct Chest W Contrast  10/05/2013   CLINICAL DATA:  Evaluate lung mass  EXAM: CT CHEST WITH CONTRAST  TECHNIQUE: Multidetector CT imaging of the chest was performed during intravenous contrast administration.  CONTRAST:  59mL OMNIPAQUE IOHEXOL 300 MG/ML  SOLN  COMPARISON:  12/29/2011  FINDINGS: No pleural effusion. There is no airspace consolidation identified. Mild changes of centrilobular emphysema. Left lower lobe lung mass measures 2.9 x 2.5 cm, image 35/series 3. 3 mm nodule is identified within the right lower lobe, image 49/series 3. There is also a 3 mm right lower lobe nodule, image 47/series 3. Nonspecific right upper lobe nodule measures 3 mm, image 20/series 3.  The heart size is normal. 9 mm left paratracheal lymph node is identified, image 22/series 2. No enlarged right paratracheal or right hilar lymph nodes. No supraclavicular or axillary adenopathy. Atherosclerotic disease involves the thoracic aorta as well as the LAD, left circumflex and RCA coronary arteries.  7 mm low attenuation structure is identified within the left hepatic lobe, image 54/series 2. This is unchanged from previous exam and is likely a benign abnormality. The adrenal glands both appear normal.  Review of the visualized osseous structures is significant for mild spondylosis within the lower  thoracic spine. There are no aggressive lytic or sclerotic bone lesions identified.  IMPRESSION: 1. Left lower lobe lung mass is worrisome for primary bronchogenic carcinoma. Further evaluation with PET-CT and tissue sampling is advised. 2. Prominent but non pathologically enlarged left paratracheal lymph node measures 9 mm. Attention in this lymph node on PET-CT is recommended 3. Emphysema 4. Atherosclerotic disease including 3 vessel coronary artery calcifications.   Electronically Signed   By: Kerby Moors M.D.   On: 10/05/2013 17:23   Nm Pet Image Initial (pi) Skull Base To Thigh  10/13/2013   CLINICAL DATA:  Initial treatment strategy for lung mass.  EXAM: NUCLEAR MEDICINE PET SKULL BASE TO THIGH  TECHNIQUE: 6.6 MCi F-18 FDG was injected intravenously. Full-ring PET imaging was performed from the skull base to thigh after the radiotracer. CT data was obtained and used for attenuation correction and anatomic localization.  FASTING BLOOD GLUCOSE:  Value: 94 mg/dl  COMPARISON:  CT chest from 10/05/2013  FINDINGS: NECK  No hypermetabolic lymph nodes in the neck.  CHEST  A 2.9 x 2.5 cm left lower lobe mass seen on the recent chest CT is markedly hypermetabolic with SUV max = 00.8. Multiple tiny right lung nodules were seen on the previous CT is well. These do not show FDG uptake but are below the accepted size threshold for reliable resolution on PET imaging.  No hypermetabolic uptake in the mediastinum or either hilum.  ABDOMEN/PELVIS  No abnormal hypermetabolic activity within the liver, pancreas, adrenal glands, or spleen. No hypermetabolic lymph nodes in the abdomen or pelvis.  Tiny low-density foci in the liver are likely related the cysts. 5 x 7 mm nonobstructing stone is identified in the interpolar left kidney. Atherosclerotic calcification noted in the abdominal aorta without aneurysm.  SKELETON  No focal hypermetabolic activity to suggest skeletal metastasis.  IMPRESSION: Hypermetabolic left lower lobe  pulmonary mass compatible with malignancy. No evidence for malignant range uptake in hilar or mediastinal lymph nodes. No evidence for distant hypermetabolic metastases.   Electronically Signed   By: Misty Stanley M.D.   On: 10/13/2013 14:35   Dg Chest Port 1 View  10/25/2013   CLINICAL DATA:  Postop lung  biopsy.  EXAM: PORTABLE CHEST - 1 VIEW  COMPARISON:  Earlier same day.  FINDINGS: Lungs are adequately inflated without evidence of effusion or pneumothorax. No change in patient's known left lower lobe lung mass. Cardiomediastinal silhouette and remainder of the exam is unchanged.  IMPRESSION: No evidence of left-sided pneumothorax. No acute cardiopulmonary disease.  No change in patient's known left lower lobe lung mass.   Electronically Signed   By: Marin Olp M.D.   On: 10/25/2013 17:11   Dg C-arm Bronchoscopy  10/25/2013   CLINICAL DATA: left lower lobe lung mass   C-ARM BRONCHOSCOPY  Fluoroscopy was utilized by the requesting physician.  No radiographic  interpretation.    IMPRESSION: Stage I NSCLC in a medically deconditioned patient with concurrent bladder cancer  PLAN:I discussed his diagnosis and options for treatment. We discussed curative radiation for 5 weeks ala the CALGB regimen. This lesion is slightly too big and too medially to attempt SBRT so unfortunately hypofractionated daily treatment would be the best curative treatment. All other treatments would be palliative in approach which I discussed with him and his family. Wed discussed treatment with a single fraction now versus waiting until he has symptoms and treating at that time. We discussed the process of simulation and the placement of tattoos. We will be able to accommodate him in a single trip if it is really very difficult for him to traveled. We discussed that I would recommend treatment of the lung cancer before the bladder cancer if he wished to pursue definitve treatment.   We also discussed his ability to tolerate a  radical cystectomy. Both the patient and his family expressed concerns that he would not be able to tolerate a large surgery given his very poor performance status.  His appearance in clinic today certainly is better than what they describe him doing at home. After discussion with physical therapy it is possible a large surgery would set him back and may require outpatient rehabilitation after surgery if he is in fact cleared from a pulmonary and cardiac perspective to undergo this surgery.   We discussed palliative care and hospice which I think would be appropriate given his deconditioned status and concurrent malignancies.  I shared with them that a discussion of their goals of care as a family would be improtant.  We discussed that it is unlikely his quality of life will be improved by radiation or by the surgery and it may in fact be worse due to complications or fatigue associated with either one of these interventions. I think no treatment of either and hospice enrollment would also be a reasonable option but they were not ready for that at this moment.   They had many questions regarding supplements and nutrition. We talked about protein intake and caloric intake over supplements. We discussed dehydration and homeostasis of the body maintained by the liver and kidneys.   Ultimately, I gave his wife my card and asked her to call me after they discussed these issues with Dr. Tresa Moore and Dr. Luan Pulling next week.     I spent 60 minutes  face to face with the patient and more than 50% of that time was spent in counseling and/or coordination of care.   ------------------------------------------------  Thea Silversmith, MD

## 2013-11-06 ENCOUNTER — Telehealth: Payer: Self-pay | Admitting: *Deleted

## 2013-11-06 NOTE — Telephone Encounter (Signed)
I called and spoke with wife today.  I inquired if she had seen Dr. Tresa Moore or Dr. Luan Pulling and thoughts about treatment.  She stated patient sees Dr. Tammi Klippel on 11/07/13 and Dr. Luan Pulling on 11/08/13 and they will make a decision after those appt.  I stated I would call back Thursday regarding plan.

## 2013-11-10 ENCOUNTER — Telehealth: Payer: Self-pay | Admitting: *Deleted

## 2013-11-10 NOTE — Telephone Encounter (Signed)
Called pt to follow up from clinic regarding plans for treatment.  He stated he would like to receive radiation therapy.  He is set up to be seen on 11/17/13 with Dr. [name].

## 2013-11-28 ENCOUNTER — Telehealth: Payer: Self-pay | Admitting: *Deleted

## 2013-11-28 ENCOUNTER — Encounter: Payer: Self-pay | Admitting: *Deleted

## 2013-11-28 NOTE — Telephone Encounter (Signed)
Called left a vm message to call with my name and phone number

## 2013-11-28 NOTE — [facility] Oncology Navigator Note (Unsigned)
Called patient to check on him.  He stated he is receiving radiation treatment at [facility].  We spoke about smoking cessation.  He stated he has cut back significantly.  I am so happy for him.  I encourage him to continue to quit.  We discussed different tip and tricks to help.  He stated he wanted to quit and is working on it.  I stated he could call me if needed further assistance with smoking cessation.

## 2014-01-09 ENCOUNTER — Other Ambulatory Visit: Payer: Self-pay | Admitting: Urology

## 2014-01-11 ENCOUNTER — Other Ambulatory Visit: Payer: Self-pay | Admitting: Urology

## 2014-01-11 ENCOUNTER — Ambulatory Visit (HOSPITAL_COMMUNITY)
Admission: RE | Admit: 2014-01-11 | Discharge: 2014-01-11 | Disposition: A | Discharge status: Home / Self Care | Payer: Medicare Other | Source: Ambulatory Visit | Attending: Urology | Admitting: Urology

## 2014-01-11 DIAGNOSIS — Z01818 Encounter for other preprocedural examination: Secondary | ICD-10-CM | POA: Diagnosis present

## 2014-01-11 DIAGNOSIS — J449 Chronic obstructive pulmonary disease, unspecified: Secondary | ICD-10-CM | POA: Diagnosis not present

## 2014-01-11 DIAGNOSIS — C3490 Malignant neoplasm of unspecified part of unspecified bronchus or lung: Secondary | ICD-10-CM

## 2014-01-15 ENCOUNTER — Encounter (HOSPITAL_COMMUNITY): Payer: Self-pay | Admitting: *Deleted

## 2014-01-17 NOTE — H&P (Signed)
Active Problems Problems  1. Benign prostatic hyperplasia with urinary obstruction (N40.1) 2. Calculus of kidney (N20.0) 3. History of recurrent UTIs (Z87.440) 4. Increased urinary frequency (R35.0) 5. Microscopic hematuria (R31.2) 6. Penile pain (N48.89) 7. 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 Pulmonary nodule seen on imaging study (R91.1) 9. Pyuria (N39.0) 10. Rectal pain (K62.89) 11. Urge incontinence of urine (N39.41)  History of Present Illness Brian Sims returns today for cystoscopy to reevaluate the bladder and prostate. 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  He is not felt to be a good candidate for cystectomy or radiation/chemo and is being evaluated for possible restaging resection and BCG therapy.  He has some dysuria.   Past Medical History Problems  1. History of Anxiety (F41.9) 2. History of Dysuria (R30.0) 3. History of Gross hematuria (R31.0) 4. History of arthritis (Z87.39) 5. History of carpal tunnel syndrome (Z86.69) 6. History of chronic obstructive lung disease (Z87.09) 7. History of depression (Z86.59) 8. History of esophageal reflux (Z87.19) 9. History of hypertension (Z86.79) 10. History of IBS (Z87.19) 11. History of Incomplete bladder emptying (R33.9) 12. History of Loss of weight (R63.4)  Surgical History Problems  1. History of Arthroscopy Shoulder Left 2. History of Cystoscopy With Fulguration Large Lesion (Over 5cm) 3. History of Hand Surgery 4. History of Knee Arthroscopy 5. History of Lower Back Surgery 6. History of Lung Surgery 7. History of Radiation Therapy  Current Meds 1. ALPRAZolam 1 MG Oral Tablet;  Therapy: (Recorded:17Jul2015) to Recorded 2. Daily Multiple Vitamins TABS;  Therapy: (Recorded:29Sep2015) to Recorded 3. Equate Stool Softener CAPS;  Therapy: (Recorded:29Sep2015) to Recorded 4. Linzess 145 MCG Oral Capsule;  Therapy: (Recorded:17Jul2015) to Recorded 5.  Mirtazapine 30 MG Oral Tablet;  Therapy: (Recorded:17Jul2015) to Recorded 6. Omeprazole 20 MG Oral Tablet Delayed Release;  Therapy: (Recorded:17Jul2015) to Recorded 7. Oxycodone-Acetaminophen 10-325 MG Oral Tablet;  Therapy: (Recorded:17Jul2015) to Recorded 8. Proctozone-HC 2.5 % Rectal Cream;  Therapy: (Recorded:17Jul2015) to Recorded 9. Spiriva HandiHaler CAPS;  Therapy: (Recorded:17Jul2015) to Recorded 10. TraZODone HCl - 150 MG Oral Tablet;   Therapy: (Recorded:17Jul2015) to Recorded  Allergies Medication  1. Gabapentin CAPS 2. Flexeril 3. Lyrica CAPS 4. Nucynta TABS  Family History Problems  1. Family history of Death of family member : Mother, Father, Sister, Brother 2. Family history of arthritis (Z82.61) : Mother, Father, Brother 3. Family history of diabetes mellitus (Z83.3) : Sister 4. Family history of irritable bowel syndrome (Z83.79) 5. Family history of kidney stones (Z84.1) : Brother  Social History Problems  1. Denied: History of Alcohol use 2. Denied: History of Caffeine use 3. Current every day smoker (F17.200) 4. Disabled 5. Married 6. Number of children   1 son  Review of Systems  Genitourinary: urinary frequency, but no dysuria.  Gastrointestinal: abdominal pain and constipation.  Cardiovascular: no chest pain.  Respiratory: no shortness of breath.    Vitals Vital Signs [Data Includes: Last 1 Day]  Recorded: [id number] 10:02AM  Blood Pressure: 102 / 74 Temperature: 97.4 F Heart Rate: 66  Physical Exam Constitutional: Well nourished and well developed . No acute distress.  Pulmonary: No respiratory distress and normal respiratory rhythm and effort. Decreased breath sounds heard on ascultation.  Cardiovascular: Heart rate and rhythm are normal . No peripheral edema.    Results/Data Urine [Data Includes: Last 1 Day]   [id number]  COLOR YELLOW   APPEARANCE CLEAR   SPECIFIC GRAVITY 1.030   pH 5.5   GLUCOSE NEG  mg/dL  BILIRUBIN SMALL    KETONE NEG mg/dL  BLOOD NEG   PROTEIN 30 mg/dL  UROBILINOGEN 0.2 mg/dL  NITRITE NEG   LEUKOCYTE ESTERASE NEG   SQUAMOUS EPITHELIAL/HPF RARE   WBC 3-6 WBC/hpf  RBC 11-20 RBC/hpf  BACTERIA MODERATE   CRYSTALS Calcium Oxalate crystals noted   CASTS NONE SEEN    Old records or history reviewed: I spoke with Dr. [name] about the radiation chemo option and she is very concerned about his ability to tolerate the therapy from a systemic and GI standpoint.  The following clinical lab reports were reviewed:  UA and culture reviewed. Selected Results  URINE CULTURE [id number] 05:03PM Irine Seal  SOURCE : CLEAN CATCH SPECIMEN TYPE: CLEAN CATCH   Test Name Result Flag Reference  CULTURE, URINE Culture, Urine    ===== COLONY COUNT: =====  NO GROWTH   FINAL REPORT: NO GROWTH   Procedure  Procedure: Cystoscopy   Indication: History of Urothelial Carcinoma.  Informed Consent: Risks, benefits, and potential adverse events were discussed and informed consent was obtained from the patient.  Prep: The patient was prepped with betadine.  Procedure Note:  Urethral meatus:. No abnormalities.  Anterior urethra: No abnormalities.  Prostatic urethra:. Estimated length was 3 cm. There was visual obstruction of the prostatic urethra. The lateral prostatic lobes were enlarged. With a well healed area of resection at the right bladder neck.  Bladder: Visulization was clear. The ureteral orifices were in the normal anatomic position bilaterally and had clear efflux of urine. A systematic survey of the bladder demonstrated no bladder tumors or stones. Examination of the bladder demonstrated erythematous mucosa (on the right posterior wall that is worrisome for CIS. No papillary tumors are seen. ). The patient tolerated the procedure well.  Complications: None.    Assessment Assessed  1. 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   Plan Health Maintenance  1. UA With REFLEX; [Do Not Release]; Status:Resulted - Requires Verification;   Done:  [id number] 09:40AM Primary cancer of trigone of urinary bladder  2. Follow-up Schedule Surgery Office  Follow-up  Status: Hold For - Appointment   Requested for: [phone]  I am going to set him up for restaging with a TURP and bladder biopsy and then will consider BCG therapy. I have reviewed the risks of the procedure again and will try to get him on ASAP.   Discussion/Summary CC: Dr. Thea Silversmith and Dr. Velvet Bathe.

## 2014-01-18 ENCOUNTER — Ambulatory Visit (HOSPITAL_COMMUNITY)
Admission: RE | Admit: 2014-01-18 | Discharge: 2014-01-19 | Disposition: A | Discharge status: Home / Self Care | Payer: Medicare Other | Source: Ambulatory Visit | Attending: Urology | Admitting: Urology

## 2014-01-18 ENCOUNTER — Ambulatory Visit (HOSPITAL_COMMUNITY): Payer: Medicare Other | Admitting: Anesthesiology

## 2014-01-18 ENCOUNTER — Encounter (HOSPITAL_COMMUNITY)
Admission: RE | Disposition: A | Discharge status: Home / Self Care | Payer: Self-pay | Source: Ambulatory Visit | Attending: Urology

## 2014-01-18 ENCOUNTER — Encounter (HOSPITAL_COMMUNITY): Payer: Self-pay | Admitting: *Deleted

## 2014-01-18 DIAGNOSIS — I10 Essential (primary) hypertension: Secondary | ICD-10-CM | POA: Diagnosis not present

## 2014-01-18 DIAGNOSIS — C672 Malignant neoplasm of lateral wall of bladder: Secondary | ICD-10-CM | POA: Diagnosis not present

## 2014-01-18 DIAGNOSIS — Z888 Allergy status to other drugs, medicaments and biological substances status: Secondary | ICD-10-CM | POA: Diagnosis not present

## 2014-01-18 DIAGNOSIS — N401 Enlarged prostate with lower urinary tract symptoms: Secondary | ICD-10-CM | POA: Diagnosis not present

## 2014-01-18 DIAGNOSIS — R634 Abnormal weight loss: Secondary | ICD-10-CM | POA: Diagnosis not present

## 2014-01-18 DIAGNOSIS — F329 Major depressive disorder, single episode, unspecified: Secondary | ICD-10-CM | POA: Diagnosis not present

## 2014-01-18 DIAGNOSIS — F17210 Nicotine dependence, cigarettes, uncomplicated: Secondary | ICD-10-CM | POA: Insufficient documentation

## 2014-01-18 DIAGNOSIS — K589 Irritable bowel syndrome without diarrhea: Secondary | ICD-10-CM | POA: Insufficient documentation

## 2014-01-18 DIAGNOSIS — C679 Malignant neoplasm of bladder, unspecified: Secondary | ICD-10-CM | POA: Diagnosis present

## 2014-01-18 DIAGNOSIS — Z87440 Personal history of urinary (tract) infections: Secondary | ICD-10-CM | POA: Diagnosis not present

## 2014-01-18 DIAGNOSIS — M1990 Unspecified osteoarthritis, unspecified site: Secondary | ICD-10-CM | POA: Insufficient documentation

## 2014-01-18 DIAGNOSIS — R350 Frequency of micturition: Secondary | ICD-10-CM | POA: Insufficient documentation

## 2014-01-18 DIAGNOSIS — N3941 Urge incontinence: Secondary | ICD-10-CM | POA: Insufficient documentation

## 2014-01-18 DIAGNOSIS — C7982 Secondary malignant neoplasm of genital organs: Secondary | ICD-10-CM | POA: Diagnosis not present

## 2014-01-18 DIAGNOSIS — N138 Other obstructive and reflux uropathy: Secondary | ICD-10-CM | POA: Insufficient documentation

## 2014-01-18 DIAGNOSIS — Z79899 Other long term (current) drug therapy: Secondary | ICD-10-CM | POA: Insufficient documentation

## 2014-01-18 DIAGNOSIS — Z87442 Personal history of urinary calculi: Secondary | ICD-10-CM | POA: Insufficient documentation

## 2014-01-18 DIAGNOSIS — C675 Malignant neoplasm of bladder neck: Secondary | ICD-10-CM

## 2014-01-18 HISTORY — PX: TRANSURETHRAL RESECTION OF BLADDER TUMOR WITH GYRUS (TURBT-GYRUS): SHX6458

## 2014-01-18 HISTORY — PX: TRANSURETHRAL RESECTION OF PROSTATE: SHX73

## 2014-01-18 SURGERY — TRANSURETHRAL RESECTION OF BLADDER TUMOR WITH GYRUS (TURBT-GYRUS)
Anesthesia: General

## 2014-01-18 MED ORDER — OXYCODONE HCL 5 MG/5ML PO SOLN: 5.0000 mg | Freq: Once | ORAL | Status: DC | PRN

## 2014-01-18 MED ORDER — TIOTROPIUM BROMIDE MONOHYDRATE 18 MCG IN CAPS
18.0000 ug | ORAL_CAPSULE | Freq: Every day | RESPIRATORY_TRACT | Status: DC
  Filled 2014-01-18: qty 5

## 2014-01-18 MED ORDER — PROMETHAZINE HCL 25 MG/ML IJ SOLN: 6.2500 mg | INTRAMUSCULAR | Status: DC | PRN

## 2014-01-18 MED ORDER — LIDOCAINE HCL (CARDIAC) 20 MG/ML IV SOLN
INTRAVENOUS | Status: DC | PRN
Start: 2014-01-18 — End: 2014-01-18
  Administered 2014-01-18: 100 mg via INTRAVENOUS

## 2014-01-18 MED ORDER — EPHEDRINE SULFATE 50 MG/ML IJ SOLN
INTRAMUSCULAR | Status: DC | PRN
  Administered 2014-01-18: 10 mg via INTRAVENOUS

## 2014-01-18 MED ORDER — LIDOCAINE HCL (CARDIAC) 20 MG/ML IV SOLN
INTRAVENOUS | Status: AC
  Filled 2014-01-18: qty 5

## 2014-01-18 MED ORDER — LACTATED RINGERS IV SOLN
INTRAVENOUS | Status: DC
  Administered 2014-01-18: 1000 mL via INTRAVENOUS

## 2014-01-18 MED ORDER — ONDANSETRON HCL 4 MG/2ML IJ SOLN
INTRAMUSCULAR | Status: AC
  Filled 2014-01-18: qty 2

## 2014-01-18 MED ORDER — PHENYLEPHRINE HCL 10 MG/ML IJ SOLN
INTRAMUSCULAR | Status: DC | PRN
  Administered 2014-01-18: 80 ug via INTRAVENOUS

## 2014-01-18 MED ORDER — PHENAZOPYRIDINE HCL 200 MG PO TABS
200.0000 mg | ORAL_TABLET | Freq: Three times a day (TID) | ORAL | Status: DC | PRN
  Filled 2014-01-18 (×3): qty 1

## 2014-01-18 MED ORDER — PROPOFOL 10 MG/ML IV BOLUS
INTRAVENOUS | Status: DC | PRN
Start: 2014-01-18 — End: 2014-01-18
  Administered 2014-01-18: 150 mg via INTRAVENOUS

## 2014-01-18 MED ORDER — ACETAMINOPHEN 325 MG PO TABS: 650.0000 mg | ORAL_TABLET | ORAL | Status: DC | PRN

## 2014-01-18 MED ORDER — PROPOFOL 10 MG/ML IV BOLUS
INTRAVENOUS | Status: AC
  Filled 2014-01-18: qty 20

## 2014-01-18 MED ORDER — HYDROMORPHONE HCL 1 MG/ML IJ SOLN
0.2500 mg | INTRAMUSCULAR | Status: DC | PRN
  Administered 2014-01-18: 0.25 mg via INTRAVENOUS
  Administered 2014-01-18 (×3): 0.5 mg via INTRAVENOUS
  Administered 2014-01-18: 0.25 mg via INTRAVENOUS

## 2014-01-18 MED ORDER — SODIUM CHLORIDE 0.9 % IJ SOLN
INTRAMUSCULAR | Status: AC
  Filled 2014-01-18: qty 10

## 2014-01-18 MED ORDER — ALPRAZOLAM 0.5 MG PO TABS
0.5000 mg | ORAL_TABLET | Freq: Every day | ORAL | Status: DC
  Administered 2014-01-18 – 2014-01-19 (×5): 0.5 mg via ORAL
  Filled 2014-01-18 (×5): qty 1

## 2014-01-18 MED ORDER — KCL IN DEXTROSE-NACL 20-5-0.45 MEQ/L-%-% IV SOLN
INTRAVENOUS | Status: DC
  Administered 2014-01-18 – 2014-01-19 (×2): via INTRAVENOUS
  Filled 2014-01-18 (×4): qty 1000

## 2014-01-18 MED ORDER — HYDROMORPHONE HCL 1 MG/ML IJ SOLN
0.5000 mg | INTRAMUSCULAR | Status: DC | PRN
  Administered 2014-01-18 – 2014-01-19 (×6): 1 mg via INTRAVENOUS
  Filled 2014-01-18 (×6): qty 1

## 2014-01-18 MED ORDER — CIPROFLOXACIN HCL 250 MG PO TABS
250.0000 mg | ORAL_TABLET | Freq: Two times a day (BID) | ORAL | Status: DC
  Administered 2014-01-18 – 2014-01-19 (×3): 250 mg via ORAL
  Filled 2014-01-18 (×3): qty 1

## 2014-01-18 MED ORDER — HYOSCYAMINE SULFATE 0.125 MG SL SUBL
0.1250 mg | SUBLINGUAL_TABLET | SUBLINGUAL | Status: DC | PRN
  Filled 2014-01-18: qty 1

## 2014-01-18 MED ORDER — SODIUM CHLORIDE 0.9 % IR SOLN
Status: DC | PRN
  Administered 2014-01-18: 6000 mL

## 2014-01-18 MED ORDER — OXYCODONE HCL 5 MG PO TABS: 5.0000 mg | ORAL_TABLET | Freq: Once | ORAL | Status: DC | PRN

## 2014-01-18 MED ORDER — FENTANYL CITRATE 0.05 MG/ML IJ SOLN
INTRAMUSCULAR | Status: DC | PRN
  Administered 2014-01-18 (×2): 25 ug via INTRAVENOUS

## 2014-01-18 MED ORDER — OXYCODONE-ACETAMINOPHEN 5-325 MG PO TABS
1.0000 | ORAL_TABLET | ORAL | Status: DC | PRN
  Administered 2014-01-19 (×3): 1 via ORAL
  Filled 2014-01-18 (×3): qty 1

## 2014-01-18 MED ORDER — PANTOPRAZOLE SODIUM 40 MG PO TBEC
40.0000 mg | DELAYED_RELEASE_TABLET | Freq: Every day | ORAL | Status: DC
  Filled 2014-01-18: qty 1

## 2014-01-18 MED ORDER — HYDROMORPHONE HCL 1 MG/ML IJ SOLN
INTRAMUSCULAR | Status: AC
  Filled 2014-01-18: qty 1

## 2014-01-18 MED ORDER — CIPROFLOXACIN IN D5W 400 MG/200ML IV SOLN
400.0000 mg | INTRAVENOUS | Status: AC
  Administered 2014-01-18: 400 mg via INTRAVENOUS

## 2014-01-18 MED ORDER — MIRTAZAPINE 45 MG PO TABS
45.0000 mg | ORAL_TABLET | Freq: Every day | ORAL | Status: DC
  Administered 2014-01-18: 45 mg via ORAL
  Filled 2014-01-18: qty 1

## 2014-01-18 MED ORDER — PHENYLEPHRINE 40 MCG/ML (10ML) SYRINGE FOR IV PUSH (FOR BLOOD PRESSURE SUPPORT)
PREFILLED_SYRINGE | INTRAVENOUS | Status: AC
  Filled 2014-01-18: qty 10

## 2014-01-18 MED ORDER — DIPHENHYDRAMINE HCL 50 MG/ML IJ SOLN: 12.5000 mg | Freq: Four times a day (QID) | INTRAMUSCULAR | Status: DC | PRN

## 2014-01-18 MED ORDER — MIDAZOLAM HCL 5 MG/5ML IJ SOLN
INTRAMUSCULAR | Status: DC | PRN
  Administered 2014-01-18: 2 mg via INTRAVENOUS

## 2014-01-18 MED ORDER — CIPROFLOXACIN IN D5W 400 MG/200ML IV SOLN
INTRAVENOUS | Status: AC
  Filled 2014-01-18: qty 200

## 2014-01-18 MED ORDER — FENTANYL CITRATE 0.05 MG/ML IJ SOLN
INTRAMUSCULAR | Status: AC
  Filled 2014-01-18: qty 2

## 2014-01-18 MED ORDER — ONDANSETRON HCL 4 MG/2ML IJ SOLN: 4.0000 mg | INTRAMUSCULAR | Status: DC | PRN

## 2014-01-18 MED ORDER — LINACLOTIDE 145 MCG PO CAPS: 145.0000 ug | ORAL_CAPSULE | Freq: Every morning | ORAL | Status: DC

## 2014-01-18 MED ORDER — ONDANSETRON HCL 4 MG/2ML IJ SOLN
INTRAMUSCULAR | Status: DC | PRN
  Administered 2014-01-18: 4 mg via INTRAVENOUS

## 2014-01-18 MED ORDER — MIDAZOLAM HCL 2 MG/2ML IJ SOLN
INTRAMUSCULAR | Status: AC
  Filled 2014-01-18: qty 2

## 2014-01-18 MED ORDER — DIPHENHYDRAMINE HCL 12.5 MG/5ML PO ELIX: 12.5000 mg | ORAL_SOLUTION | Freq: Four times a day (QID) | ORAL | Status: DC | PRN

## 2014-01-18 MED ORDER — MEPERIDINE HCL 50 MG/ML IJ SOLN: 6.2500 mg | INTRAMUSCULAR | Status: DC | PRN

## 2014-01-18 MED ORDER — HYDROCORTISONE 2.5 % RE CREA
1.0000 "application " | TOPICAL_CREAM | Freq: Three times a day (TID) | RECTAL | Status: DC | PRN
  Filled 2014-01-18: qty 28.35

## 2014-01-18 MED ORDER — POLYVINYL ALCOHOL 1.4 % OP SOLN
1.0000 [drp] | Freq: Every day | OPHTHALMIC | Status: DC
  Filled 2014-01-18: qty 15

## 2014-01-18 MED ORDER — DOCUSATE SODIUM 100 MG PO CAPS
200.0000 mg | ORAL_CAPSULE | Freq: Every day | ORAL | Status: DC
  Filled 2014-01-18: qty 2

## 2014-01-18 MED ORDER — EPHEDRINE SULFATE 50 MG/ML IJ SOLN
INTRAMUSCULAR | Status: AC
  Filled 2014-01-18: qty 1

## 2014-01-18 MED ORDER — TRAZODONE HCL 150 MG PO TABS
150.0000 mg | ORAL_TABLET | Freq: Every day | ORAL | Status: DC
  Filled 2014-01-18: qty 1

## 2014-01-18 SURGICAL SUPPLY — 21 items
BAG URINE DRAINAGE (UROLOGICAL SUPPLIES) IMPLANT
BAG URO CATCHER STRL LF (DRAPE) ×3 IMPLANT
BLADE SURG 15 STRL LF DISP TIS (BLADE) IMPLANT
BLADE SURG 15 STRL SS (BLADE)
CATH FOLEY 3WAY 30CC 22FR (CATHETERS) IMPLANT
CATH URET 5FR 28IN OPEN ENDED (CATHETERS) IMPLANT
ELECT LOOP MED HF 24F 12D CBL (CLIP) ×3 IMPLANT
ELECT REM PT RETURN 9FT ADLT (ELECTROSURGICAL) ×3
ELECTRODE REM PT RTRN 9FT ADLT (ELECTROSURGICAL) ×1 IMPLANT
GLOVE SURG SS PI 8.0 STRL IVOR (GLOVE) IMPLANT
GOWN STRL REUS W/TWL XL LVL3 (GOWN DISPOSABLE) ×3 IMPLANT
HOLDER FOLEY CATH W/STRAP (MISCELLANEOUS) IMPLANT
KIT ASPIRATION TUBING (SET/KITS/TRAYS/PACK) ×3 IMPLANT
LOOPS RESECTOSCOPE DISP (ELECTROSURGICAL) ×3 IMPLANT
MANIFOLD NEPTUNE II (INSTRUMENTS) ×3 IMPLANT
PACK CYSTO (CUSTOM PROCEDURE TRAY) ×3 IMPLANT
SUT ETHILON 3 0 PS 1 (SUTURE) IMPLANT
SYR 30ML LL (SYRINGE) IMPLANT
SYRINGE IRR TOOMEY STRL 70CC (SYRINGE) IMPLANT
TUBING CONNECTING 10 (TUBING) ×2 IMPLANT
TUBING CONNECTING 10' (TUBING) ×1

## 2014-01-18 NOTE — Transfer of Care (Signed)
Immediate Anesthesia Transfer of Care Note  Patient: Brian Sims  Procedure(s) Performed: Procedure(s) (LRB): TRANSURETHRAL RESECTION OF BLADDER TUMOR WITH GYRUS (TURBT-GYRUS) (N/A) TRANSURETHRAL RESECTION OF THE PROSTATE (TURP) (N/A)  Patient Location: PACU  Anesthesia Type: General  Level of Consciousness: sedated, patient cooperative and responds to stimulation  Airway & Oxygen Therapy: Patient Spontanous Breathing and Patient connected to face mask oxgen  Post-op Assessment: Report given to PACU RN and Post -op Vital signs reviewed and stable  Post vital signs: Reviewed and stable  Complications: No apparent anesthesia complications

## 2014-01-18 NOTE — Discharge Instructions (Signed)
Transurethral Resection of the Prostate Care After Refer to this sheet in the next few weeks. These instructions provide you with information on caring for yourself after your procedure. Your caregiver also may give you specific instructions. Your treatment has been planned according to current medical practices, but complications sometimes occur. Call your caregiver if you have any problems or questions after your procedure. HOME CARE INSTRUCTIONS  Recovery can take 4-6 weeks. Avoid alcohol, caffeinated drinks, and spicy foods for 2 weeks after your procedure. Drink enough fluids to keep your urine clear or pale yellow. Urinate as soon as you feel the urge to do so. Do not try to hold your urine for long periods of time. During recovery you may experience pain caused by bladder spasms, which result in a very intense urge to urinate. Take all medicines as directed by your caregiver, including medicines for pain. Try to limit the amount of pain medicines you take because it can cause constipation. If you do become constipated, do not strain to move your bowels. Straining can increase bleeding. Constipation can be minimized by increasing the amount fluids and fiber in your diet. Your caregiver also may prescribe a stool softener. Do not lift heavy objects (more than 5 lb [2.25 kg]) or perform exercises that cause you to strain for at least 1 month after your procedure. When sitting, you may want to sit in a soft chair or use a cushion. For the first 10 days after your procedure, avoid the following activities:  Running.  Strenuous work.  Long walks.  Riding in a car for extended periods.  Sex. SEEK MEDICAL CARE IF:  You have difficulty urinating.  You have blood in your urine that does not go away after you rest or increase your fluid intake.  You have swelling in your penis or scrotum. SEEK IMMEDIATE MEDICAL CARE IF:   You are suddenly unable to urinate.  You notice blood clots in your  urine.  You have chills.  You have a fever.  You have pain in your back or lower abdomen.  You have pain or swelling in your legs. MAKE SURE YOU:   Understand these instructions.  Will watch your condition.  Will get help right away if you are not doing well or get worse. Document Released: 01/26/2005 Document Revised: 10/21/2011 Document Reviewed: 03/06/2011 [facility] Patient Information 2015 [place], [place]. This information is not intended to replace advice given to you by your health care provider. Make sure you discuss any questions you have with your health care provider.

## 2014-01-18 NOTE — Op Note (Signed)
NAMEMarland Kitchen  RAE, SUTCLIFFE NO.:  [id number]  MEDICAL RECORD NO.:  [id number]  LOCATION:  34                         FACILITY:  [facility]  PHYSICIAN:  Marshall Cork. Jeffie Pollock, M.D.    DATE OF BIRTH:  1950-12-03  DATE OF PROCEDURE:  01/18/2014 DATE OF DISCHARGE:                              OPERATIVE REPORT   PROCEDURE: 1. Transurethral resection/fulguration of large bladder tumor. 2. Transurethral resection of prostate.  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  SURGEON:  [name], M.D.  ANESTHESIA:  General.  SPECIMEN: 1. Right bladder wall tumor. 2. Superficial prostatic urethra. 3. Deep prostatic urethra.  BLOOD LOSS:  Minimal.  DRAINS:  A 22-French Foley catheter.  COMPLICATIONS:  None.  INDICATIONS:  [facility] is a 63 year old, white male, who presented with a long history of irritative voiding symptoms.  He was found to have a bladder tumor with carcinoma in situ and underwent resection earlier this fall.  At the time of the resection, it appeared that tumor material extended into the proximal prostatic urethra on the right, so this area was resected as well.  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  Consults from Urologic Oncology and Radiation Oncology were obtained and because of his underlying medical condition including some bowel related issues and significant pulmonary issues with recent diagnosis of lung cancer that has been treated in the interval with curative intent with radiation therapy.  He was not felt to be a candidate for radical cystoprostatectomy with diversion or with radiation therapy for the bladder tumor.  After reviewing the options, we elected to proceed with aggressive resection of the  prostatic urethra to re-stage him and debulking of the carcinoma in situ with subsequent BCG therapy.  He presents today for the TURP and TURBT.  FINDINGS AND PROCEDURES:  He was given Cipro.  He was taken to the operating room, where general anesthetic was induced.  He was placed in lithotomy position.  His perineum and genitalia were prepped with Betadine solution.  He was draped in the usual sterile fashion.  His urethra was calibrated to 30-French with [facility] sounds and a 28- French continuous flow resectoscope sheath with visual obturator and 12- degree lens was passed.  Inspection demonstrated a prior resection and his prostatic urethra which was short without significant lateral lobe enlargement.  Inspection of the bladder, however, revealed diffuse erythema particularly on the right lateral wall of the bladder with some low papillary fronds that are consistent with extensive carcinoma in situ.  The hot visual obturator was replaced with the [facility] handle fitted with a 12 degree lens and a bipolar loop.  Further inspection revealed no evidence of involvement of the ureteral orifice and no other significant papillary tumors, but he did have definable areas of abnormal mucosa on the right lateral wall, the dome, the posterior wall of the trigone and the left lateral wall.  Initially, I resected a specimen from the right lateral wall to send for pathological  evaluation and then generously fulgurated all of the obviously abnormal mucosa.  The area on the right lateral wall was approximately 6 cm and greatest diameter.  The area on the dome was approximately 3 cm.  The area on the left lateral wall was approximately 2 cm.  The area in the trigone was approximately 3 cm and there were several smaller 1 cm areas that required fulguration.  Once all visible carcinoma in situ had been fulgurated, the bladder was irrigated and I then turned my attention to the prostatic urethra.   An initial layer of resection was performed circumferentially from bladder neck to apex and this was then collected for pathologic evaluation. Once the initial layer had been removed, a deeper resection was performed down the capsule circumferentially as well and this was collected for pathologic analysis.  Final inspection was performed. Additional hemostasis and fulguration of the prostatic urethra was completed.  Once this area was without bleeders, further inspection of the bladder revealed no active bleeding and no retained tissue.  The resectoscope was removed and a 22-French catheter was placed with the aid of a catheter guide.  The balloon was filled with 10 mL of sterile fluid and the catheter was irrigated with clear return and placed to straight drainage.  The patient was taken down from the lithotomy position.  His anesthetic was reversed.  He was moved to the recovery room in stable condition.  There were no complications.     Marshall Cork. Jeffie Pollock, M.D.     JJW/MEDQ  D:  01/18/2014  T:  01/18/2014  Job:  [id number]

## 2014-01-18 NOTE — Anesthesia Preprocedure Evaluation (Addendum)
Anesthesia Evaluation  Patient identified by MRN, date of birth, ID band Patient awake    Reviewed: Allergy & Precautions, H&P , NPO status , Patient's Chart, lab work & pertinent test results  Airway Mallampati: II  TM Distance: >3 FB Neck ROM: full    Dental  (+) Edentulous Upper, Missing, Dental Advisory Given   Pulmonary shortness of breath, COPD COPD inhaler, Current Smoker,  breath sounds clear to auscultation  Pulmonary exam normal       Cardiovascular Exercise Tolerance: Good negative cardio ROS  Rhythm:regular Rate:Normal     Neuro/Psych PSYCHIATRIC DISORDERS Anxiety Depression negative neurological ROS     GI/Hepatic Neg liver ROS, GERD-  ,  Endo/Other  negative endocrine ROS  Renal/GU Renal diseasenegative Renal ROS     Musculoskeletal  (+) Arthritis -,   Abdominal   Peds  Hematology negative hematology ROS (+)   Anesthesia Other Findings   Reproductive/Obstetrics negative OB ROS                             Anesthesia Physical  Anesthesia Plan  ASA: III  Anesthesia Plan: General   Post-op Pain Management:    Induction: Intravenous  Airway Management Planned: LMA  Additional Equipment:   Intra-op Plan:   Post-operative Plan:   Informed Consent: I have reviewed the patients History and Physical, chart, labs and discussed the procedure including the risks, benefits and alternatives for the proposed anesthesia with the patient or authorized representative who has indicated his/her understanding and acceptance.   Dental Advisory Given  Plan Discussed with: CRNA and Surgeon  Anesthesia Plan Comments:         Anesthesia Quick Evaluation

## 2014-01-18 NOTE — Interval H&P Note (Signed)
History and Physical Interval Note:  01/18/2014 10:01 AM  [facility]  has presented today for surgery, with the diagnosis of BLADDER TUMOR INVADING PROSTATE  The various methods of treatment have been discussed with the patient and family. After consideration of risks, benefits and other options for treatment, the patient has consented to  Procedure(s): TRANSURETHRAL RESECTION OF BLADDER TUMOR WITH GYRUS (TURBT-GYRUS) (N/A) TRANSURETHRAL RESECTION OF THE PROSTATE (TURP) (N/A) as a surgical intervention .  The patient's history has been reviewed, patient examined, no change in status, stable for surgery.  I have reviewed the patient's chart and labs.  Questions were answered to the patient's satisfaction.     Orris Perin J

## 2014-01-18 NOTE — Brief Op Note (Signed)
01/18/2014  10:59 AM  PATIENT:  [facility]  63 y.o. male  PRE-OPERATIVE DIAGNOSIS:  BLADDER TUMOR INVADING PROSTATE  POST-OPERATIVE DIAGNOSIS:  BLADDER TUMOR INVADING PROSTATE  PROCEDURE:  Procedure(s): TRANSURETHRAL RESECTION OF BLADDER TUMOR >5cm WITH GYRUS (TURBT-GYRUS) (N/A) TRANSURETHRAL RESECTION OF THE PROSTATE (TURP) (N/A)  SURGEON:  Surgeon(s) and Role:    * [name], MD - Primary  PHYSICIAN ASSISTANT:   ASSISTANTS: none   ANESTHESIA:   general  EBL:     BLOOD ADMINISTERED:none  DRAINS: Urinary Catheter (Foley)   LOCAL MEDICATIONS USED:  NONE  SPECIMEN:  Source of Specimen:  right bladder wall, superficial prostatic urethra and deep prostatic urethra  DISPOSITION OF SPECIMEN:  PATHOLOGY  COUNTS:  YES  TOURNIQUET:  * No tourniquets in log *  DICTATION: .Other Dictation: Dictation Number [id number]  PLAN OF CARE: Admit for overnight observation  PATIENT DISPOSITION:  PACU - hemodynamically stable.   Delay start of Pharmacological VTE agent (>24hrs) due to surgical blood loss or risk of bleeding: yes

## 2014-01-18 NOTE — Anesthesia Postprocedure Evaluation (Signed)
Anesthesia Post Note  Patient: [facility]  Procedure(s) Performed: Procedure(s) (LRB): TRANSURETHRAL RESECTION OF BLADDER TUMOR WITH GYRUS (TURBT-GYRUS) (N/A) TRANSURETHRAL RESECTION OF THE PROSTATE (TURP) (N/A)  Anesthesia type: General  Patient location: PACU  Post pain: Pain level controlled  Post assessment: Post-op Vital signs reviewed  Last Vitals: BP 117/79 mmHg  Pulse 69  Temp(Src) 36.7 C (Oral)  Resp 16  Ht 5\' 9"  (1.753 m)  Wt 126 lb (57.153 kg)  BMI 18.60 kg/m2  SpO2 97%  Post vital signs: Reviewed  Level of consciousness: sedated  Complications: No apparent anesthesia complications

## 2014-01-18 NOTE — Plan of Care (Signed)
Problem: Consults Goal: TURP/TURBT Patient Education (See Patient Education module for education specifics.) Outcome: Completed/Met Date Met:  01/18/14 Goal: Skin Care Protocol Initiated - if Braden Score 18 or less If consults are not indicated, leave blank or document N/A Outcome: Not Applicable Date Met:  24/93/24 Goal: Nutrition Consult-if indicated Outcome: Not Applicable Date Met:  19/91/44 Goal: Diabetes Guidelines if Diabetic/Glucose > 140 If diabetic or lab glucose is > 140 mg/dl - Initiate Diabetes/Hyperglycemia Guidelines & Document Interventions  Outcome: Completed/Met Date Met:  01/18/14  Problem: Phase I Progression Outcomes Goal: Bedrest x 6 hrs for spinal anesthesia Outcome: Not Applicable Date Met:  45/84/83 Goal: No urinary obstruction (foley to traction) Outcome: Completed/Met Date Met:  01/18/14 Goal: Initial discharge plan identified Outcome: Completed/Met Date Met:  01/18/14 Goal: Tubes/drains patent Outcome: Completed/Met Date Met:  01/18/14 Goal: Hemodynamically stable Outcome: Completed/Met Date Met:  01/18/14 Goal: No symptoms of Post-TURP Syndrome Outcome: Completed/Met Date Met:  01/18/14

## 2014-01-19 ENCOUNTER — Encounter (HOSPITAL_COMMUNITY): Payer: Self-pay | Admitting: Urology

## 2014-01-19 DIAGNOSIS — C672 Malignant neoplasm of lateral wall of bladder: Secondary | ICD-10-CM | POA: Diagnosis not present

## 2014-01-19 LAB — CREATININE, SERUM
Creatinine, Ser: 0.84 mg/dL (ref 0.50–1.35)
GFR calc Af Amer: >90 mL/min (ref >90)
GFR calc non Af Amer: >90 mL/min (ref >90)

## 2014-01-19 NOTE — Discharge Summary (Signed)
Patient ID: [facility] MRN: [id number] DOB/AGE: 06/22/1950 63 y.o.  Admit date: 01/18/2014 Discharge date: 01/19/2014  Primary Care Physician:  [name], MD  Discharge Diagnoses:   Present on Admission:  . Bladder cancer  Consults:  None   Discharge Medications:   Medication List    STOP taking these medications        Linaclotide 145 MCG Caps capsule  Commonly known as:  LINZESS      TAKE these medications        ALPRAZolam 1 MG tablet  Commonly known as:  XANAX  Take 0.5 mg by mouth every 2 (two) hours.     AZO CRANBERRY PO  Take 1 tablet by mouth daily.     docusate sodium 100 MG capsule  Commonly known as:  COLACE  Take 200 mg by mouth at bedtime.     mirtazapine 30 MG tablet  Commonly known as:  REMERON  Take 45 mg by mouth at bedtime.     multivitamin with minerals tablet  Take 1 tablet by mouth every morning. [employer] Multi Vitamin     omeprazole 20 MG capsule  Commonly known as:  PRILOSEC  Take 20 mg by mouth 2 (two) times daily before a meal.     OVER THE COUNTER MEDICATION  Take 1 tablet by mouth 2 (two) times daily. Cysta Q     OVER THE COUNTER MEDICATION  Take 1 tablet by mouth every morning. Curcumine.     oxyCODONE-acetaminophen 10-325 MG per tablet  Commonly known as:  PERCOCET  Take 0.5 tablets by mouth every 2 (two) hours.     PROCTOZONE-HC 2.5 % rectal cream  Generic drug:  hydrocortisone  Place 1 application rectally 3 (three) times daily as needed for hemorrhoids (swelling).     SYSTANE BALANCE OP  Place 1 drop into both eyes daily.     tiotropium 18 MCG inhalation capsule  Commonly known as:  SPIRIVA  Place 18 mcg into inhaler and inhale every evening.     traZODone 150 MG tablet  Commonly known as:  DESYREL  Take 150 mg by mouth at bedtime.         Significant Diagnostic Studies:  Dg Chest 2 View  01/11/2014   CLINICAL DATA:  Preoperative exam, prostate cancer, mild to moderate shortness of breath  since completing radiation therapy for LEFT lung cancer on 12/24/2013 ; history COPD, GERD, smoking  EXAM: CHEST  2 VIEW  COMPARISON:  10/25/2013  FINDINGS: Normal heart size, mediastinal contours, and pulmonary vascularity.  Marked interval decrease in size of LEFT lower lobe mass since the previous exam, poorly defined approximately 2.5 x 1.9 cm, previously 3.6 x 3.7 cm.  Question BILATERAL nipple shadows, can confirm on follow-up imaging.  Emphysematous changes without infiltrate, pleural effusion or pneumothorax.  Atherosclerotic calcification aorta.  No acute osseous findings.  IMPRESSION: COPD changes with decreased size of LEFT lower lobe mass since previous exam.  Suspected BILATERAL nipple shadows.  No acute abnormalities.   Electronically Signed   By: Lavonia Dana M.D.   On: 01/11/2014 18:09    Brief H and P: For complete details please refer to admission H and P, but in brief the patient was admitted for TUR-BT  Hospital Course:  Active Problems:   Bladder cancer  the patient was admitted for bladder irrigation post TURBT. The patient's urine remained clear, and the catheter was removed on postoperative morning #1. The patient voided clear urine after this and  was discharged.  Day of Discharge BP 116/70 mmHg  Pulse 79  Temp(Src) 99.3 F (37.4 C) (Oral)  Resp 18  Ht 5\' 9"  (1.753 m)  Wt 57.153 kg (126 lb)  BMI 18.60 kg/m2  SpO2 97%  Results for orders placed or performed during the hospital encounter of 01/18/14 (from the past 24 hour(s))  Creatinine, serum     Status: None   Collection Time: 01/19/14  3:55 AM  Result Value Ref Range   Creatinine, Ser 0.84 0.50 - 1.35 mg/dL   GFR calc non Af Amer >90 >90 mL/min   GFR calc Af Amer >90 >90 mL/min    Physical Exam: General: Alert and awake oriented x3 not in any acute distress. HEENT: anicteric sclera, pupils reactive to light and accommodation CVS: S1-S2 clear no murmur rubs or gallops Chest: clear to auscultation  bilaterally, no wheezing rales or rhonchi Abdomen: soft nontender, nondistended, normal bowel sounds, no organomegaly Extremities: no cyanosis, clubbing or edema noted bilaterally Neuro: Cranial nerves II-XII intact, no focal neurological deficits  Disposition:  Home  Diet:  No restrictions  Activity:  Gradually increase   Disposition and Follow-up:     Discharge Instructions    Discharge patient    Complete by:  As directed             Instructions were given patient  TESTS THAT NEED FOLLOW-UP  Dr. Jeffie Pollock will review pathology with the patient follow-up  DISCHARGE FOLLOW-UP Follow-up Information    Follow up with [name], MD On 01/25/2014.   Specialty:  Urology   Why:  [address] information:   [place] [address] [phone]       Time spent on Discharge:  10 minutes  Signed: Jorja Loa 01/19/2014, 7:49 AM

## 2014-01-19 NOTE — Plan of Care (Signed)
Problem: Phase II Progression Outcomes Goal: PO fluids pushed Outcome: Completed/Met Date Met:  01/19/14     

## 2014-01-19 NOTE — Plan of Care (Signed)
Problem: Phase I Progression Outcomes Goal: Continue bladder and/or hand irrigation Outcome: Not Applicable Date Met:  03/55/97 Goal: Tolerating diet Outcome: Completed/Met Date Met:  01/19/14 Goal: Other Phase I Outcomes/Goals Outcome: Completed/Met Date Met:  01/19/14

## 2014-01-19 NOTE — Plan of Care (Signed)
Problem: Phase I Progression Outcomes Goal: Pain controlled with appropriate interventions Outcome: Completed/Met Date Met:  01/19/14     

## 2014-01-25 ENCOUNTER — Other Ambulatory Visit: Payer: Self-pay | Admitting: Radiation Oncology

## 2014-01-25 DIAGNOSIS — C3430 Malignant neoplasm of lower lobe, unspecified bronchus or lung: Secondary | ICD-10-CM

## 2014-01-25 DIAGNOSIS — C679 Malignant neoplasm of bladder, unspecified: Secondary | ICD-10-CM

## 2014-01-29 ENCOUNTER — Ambulatory Visit (HOSPITAL_COMMUNITY)
Admission: RE | Admit: 2014-01-29 | Discharge: 2014-01-29 | Disposition: A | Discharge status: Home / Self Care | Payer: Medicare Other | Source: Ambulatory Visit | Attending: Radiation Oncology | Admitting: Radiation Oncology

## 2014-01-29 ENCOUNTER — Encounter (HOSPITAL_COMMUNITY): Payer: Self-pay

## 2014-01-29 DIAGNOSIS — N200 Calculus of kidney: Secondary | ICD-10-CM | POA: Insufficient documentation

## 2014-01-29 DIAGNOSIS — Z923 Personal history of irradiation: Secondary | ICD-10-CM | POA: Insufficient documentation

## 2014-01-29 DIAGNOSIS — C3430 Malignant neoplasm of lower lobe, unspecified bronchus or lung: Secondary | ICD-10-CM

## 2014-01-29 DIAGNOSIS — M5136 Other intervertebral disc degeneration, lumbar region: Secondary | ICD-10-CM | POA: Insufficient documentation

## 2014-01-29 DIAGNOSIS — I700 Atherosclerosis of aorta: Secondary | ICD-10-CM | POA: Insufficient documentation

## 2014-01-29 DIAGNOSIS — C679 Malignant neoplasm of bladder, unspecified: Secondary | ICD-10-CM

## 2014-01-29 DIAGNOSIS — J432 Centrilobular emphysema: Secondary | ICD-10-CM | POA: Diagnosis not present

## 2014-01-29 DIAGNOSIS — I2510 Atherosclerotic heart disease of native coronary artery without angina pectoris: Secondary | ICD-10-CM | POA: Diagnosis not present

## 2014-01-29 DIAGNOSIS — N400 Enlarged prostate without lower urinary tract symptoms: Secondary | ICD-10-CM | POA: Insufficient documentation

## 2014-01-29 DIAGNOSIS — Z720 Tobacco use: Secondary | ICD-10-CM | POA: Insufficient documentation

## 2014-01-29 DIAGNOSIS — M5134 Other intervertebral disc degeneration, thoracic region: Secondary | ICD-10-CM | POA: Diagnosis not present

## 2014-01-29 DIAGNOSIS — C3432 Malignant neoplasm of lower lobe, left bronchus or lung: Secondary | ICD-10-CM | POA: Diagnosis present

## 2014-01-29 MED ORDER — IOHEXOL 300 MG/ML  SOLN
100.0000 mL | Freq: Once | INTRAMUSCULAR | Status: AC | PRN
  Administered 2014-01-29: 100 mL via INTRAVENOUS

## 2014-01-29 NOTE — Progress Notes (Signed)
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 male is here because of newly diagnosed high grade urothelial carcinoma with prostate invasion. 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 He states he did fairly well with radiation.   He is referred here today for additional discussion of how to treat his bladder cancer.  He is not felt to be a surgical candidate secondary to underlying pulmonary and other medical issues. There is a lot of anxiety within his family regarding his diagnosis. Since his TURP with Dr. Jeffie Pollock he notes his hematuria has improved. He reports none for several weeks. He has developed progressive pelvic pain but has always attributed this to "bowel problems". He has IBS. The patient and his family state that radiation was discussed as additional treatment for his bladder cancer but they are unsure how he will tolerate it.  MEDICAL HISTORY:  Past Medical History  Diagnosis Date  . COPD (chronic obstructive pulmonary disease)   . Inflammatory bowel disease   . Constipation   . Lesion of bladder   . Prostate mass   . Dysuria-frequency syndrome     wears depends  . Arthritis   . Shortness of breath   . Depression   . Anxiety   . Kidney stones   . GERD (gastroesophageal reflux disease)   . Cancer 2015    LT lung, bladder and prostate    SURGICAL HISTORY: Past Surgical History  Procedure Laterality Date  . Back surgery    .  Left shoulder      arthroscopy  . Rt knee arthroscopy    . Colonoscopy N/A 01/11/2013    Procedure: COLONOSCOPY;  Surgeon: [name], MD;  [facility];  Service: Endoscopy;  Laterality: N/A;  340  . Carpal tunnel release Right   . Transurethral resection of bladder tumor N/A 09/21/2013    Procedure: TRANSURETHRAL RESECTION  PROSTATE  AND BLADDER ;  Surgeon: [name], MD;  Location: [facility];  Service: Urology;  Laterality: N/A;  . Video bronchoscopy with endobronchial navigation Right 10/25/2013    Procedure: VIDEO BRONCHOSCOPY WITH ENDOBRONCHIAL NAVIGATION;  Surgeon: [name], MD;  Location: [facility];  Service: Thoracic;  Laterality: Right;  . Transurethral resection of bladder tumor with gyrus (turbt-gyrus) N/A 01/18/2014    Procedure: TRANSURETHRAL RESECTION OF BLADDER TUMOR WITH GYRUS (TURBT-GYRUS);  Surgeon: [name], MD;  Location: [facility];  Service: Urology;  Laterality: N/A;  . Transurethral resection of prostate N/A 01/18/2014    Procedure: TRANSURETHRAL RESECTION OF THE PROSTATE (TURP);  Surgeon: [name], MD;  Location: [facility];  Service: Urology;  Laterality: N/A;    SOCIAL HISTORY: History   Social History  . Marital Status: Married    Spouse Name: N/A    Number of Children: N/A  . Years of Education: N/A   Occupational History  . Not on file.   Social History Main Topics  . Smoking status: Current Every Day Smoker --  1.00 packs/day for 40 years    Types: Cigarettes  . Smokeless tobacco: Never Used     Comment: 1 pack a day since age 22  . Alcohol Use: No  . Drug Use: No  . Sexual Activity: Not on file   Other Topics Concern  . Not on file   Social History Narrative  He worked at [employer]. He used to be very [place]. He is married with one child  FAMILY HISTORY: Mother died at 75 from MRSA, she was independent until she died Father died at 90 from an MI, he was a farmer There were 9 children in  his family. 3 brothers died from lung cancer. There is diabetes, IBS and RA in the family   ALLERGIES:  is allergic to lyrica; gabapentin; and morphine and related.  MEDICATIONS:  Current Outpatient Prescriptions  Medication Sig Dispense Refill  . ALPRAZolam (XANAX) 1 MG tablet Take 0.5 mg by mouth every 2 (two) hours.     . Cranberry-Vitamin C-Probiotic (AZO CRANBERRY PO) Take 1 tablet by mouth daily.    Marland Kitchen docusate sodium (COLACE) 100 MG capsule Take 200 mg by mouth at bedtime.    . hydrocortisone (PROCTOZONE-HC) 2.5 % rectal cream Place 1 application rectally 3 (three) times daily as needed for hemorrhoids (swelling).    . Meth-Hyo-M Bl-Na Phos-Ph Sal (URIBEL) 118 MG CAPS Take by mouth 2 (two) times daily.    . mirtazapine (REMERON) 30 MG tablet Take 45 mg by mouth at bedtime.     . Multiple Vitamins-Minerals (MULTIVITAMIN WITH MINERALS) tablet Take 1 tablet by mouth every morning. Whole Foods Multi Vitamin    . omeprazole (PRILOSEC) 20 MG capsule Take 20 mg by mouth 2 (two) times daily before a meal.    . OVER THE COUNTER MEDICATION Take 1 tablet by mouth every morning. Curcumine.    Marland Kitchen oxyCODONE-acetaminophen (PERCOCET) 10-325 MG per tablet Take 0.5 tablets by mouth every 2 (two) hours.     . Propylene Glycol (SYSTANE BALANCE OP) Place 1 drop into both eyes daily.    Marland Kitchen tiotropium (SPIRIVA) 18 MCG inhalation capsule Place 18 mcg into inhaler and inhale every evening.     . traZODone (DESYREL) 150 MG tablet Take 150 mg by mouth at bedtime.     . vitamin C (ASCORBIC ACID) 500 MG tablet Take 500 mg by mouth daily.     No current facility-administered medications for this visit.    Review of Systems  Constitutional: Positive for weight loss and malaise/fatigue. Negative for fever, chills and diaphoresis.       His highest weight was 165lbs in 2011. He maintained a stable weight throughout XRT to the lung   HENT: Negative.   Eyes: Negative.   Respiratory: Positive for shortness of breath.  Negative for cough, hemoptysis, sputum production and wheezing.   Cardiovascular: Negative.   Gastrointestinal: Positive for nausea and abdominal pain. Negative for heartburn, vomiting, diarrhea, constipation, blood in stool and melena.       Abdominal cramping  Genitourinary: Positive for dysuria and hematuria. Negative for urgency, frequency and flank pain.       Hematuria improved  Musculoskeletal: Positive for back pain and joint pain. Negative for myalgias, falls and neck pain.  Skin: Negative.   Neurological: Positive for weakness. Negative for dizziness, tingling, tremors, sensory change, speech change, focal weakness, seizures and loss of consciousness.  Endo/Heme/Allergies: Negative.   Psychiatric/Behavioral: Positive for depression. Negative for suicidal ideas, hallucinations, memory loss and substance abuse.  The patient is not nervous/anxious and does not have insomnia.     PHYSICAL EXAMINATION: ECOG PERFORMANCE STATUS: 1 - Symptomatic but completely ambulatory  Filed Vitals:   01/30/14 1400  BP: 113/72  Pulse: 94  Temp: 98.7 F (37.1 C)  Resp: 18   Filed Weights   01/30/14 1400  Weight: 127 lb 1.6 oz (57.652 kg)     Physical Exam  Constitutional: He is oriented to person, place, and time.  Pleasant thin, somewhat frail appearing male, NAD  HENT:  Head: Normocephalic and atraumatic.  Mouth/Throat: Oropharynx is clear and moist. No oropharyngeal exudate.  Eyes: Conjunctivae are normal. Pupils are equal, round, and reactive to light. Right eye exhibits no discharge. Left eye exhibits no discharge. No scleral icterus.  Neck: Normal range of motion. No JVD present. No tracheal deviation present. No thyromegaly present.  Cardiovascular: Normal rate, regular rhythm and normal heart sounds.  Exam reveals no gallop.   No murmur heard. Pulmonary/Chest: Effort normal. No stridor. No respiratory distress. He has no wheezes. He has no rales. He exhibits no tenderness.   Somewhat decreased breath sounds throughout  Abdominal: Soft. Bowel sounds are normal. He exhibits no distension and no mass. There is no tenderness. There is no rebound and no guarding.  Musculoskeletal: Normal range of motion. He exhibits no edema or tenderness.  Lymphadenopathy:    He has no cervical adenopathy.  Neurological: He is alert and oriented to person, place, and time. He displays normal reflexes. No cranial nerve deficit. He exhibits normal muscle tone. Coordination normal.  Skin: Skin is warm and dry. No rash noted. No erythema. No pallor.  Psychiatric: Mood, memory, affect and judgment normal.     LABORATORY DATA:  I have reviewed the data as listed Lab Results  Component Value Date   WBC 9.2 10/25/2013   HGB 16.6 10/25/2013   HCT 48.0 10/25/2013   MCV 90.7 10/25/2013   PLT 215 10/25/2013     Chemistry      Component Value Date/Time   NA 139 10/25/2013 1211   K 4.0 10/25/2013 1211   CL 101 10/25/2013 1211   CO2 26 10/25/2013 1211   BUN 11 10/25/2013 1211   CREATININE 0.84 01/19/2014 0355      Component Value Date/Time   CALCIUM 9.7 10/25/2013 1211   ALKPHOS 83 10/25/2013 1211   AST 16 10/25/2013 1211   ALT 8 10/25/2013 1211   BILITOT 0.4 10/25/2013 1211       RADIOGRAPHIC STUDIES: I have personally reviewed the radiological images as listed and agreed with the findings in the report. No results found. PATHOLOGY: Diagnosis 1. Bladder, biopsy, right lateral wall of bladder - UROTHELIAL CARCINOMA IN SITU, NO STROMAL INVASION IDENTIFIED. - MUSCULARIS PROPRIA NOT PRESENT FOR EVALUATION. 2. Prostate, TUR, superficial prostatic tissue - HIGH GRADE UROTHELIAL CARCINOMA WITH ASSOCIATED TUMOR NECROSIS, INVOLVING PROSTATIC DUCTS. - NO STROMAL INVASION IDENTIFIED. 3. Prostate, TUR, deep prostatic tissue - HIGH GRADE UROTHELIAL CARCINOMA WITH ASSOCIATED TUMOR NECROSIS, INVOLVING PROSTATIC DUCTS AND FOCALLY INVADING INTO THE STROMA. - PLEASE SEE  COMMENT. Microscopic Comment 3. There is no evidence of angiolymphatic invasion identified. (HCL:ecj 01/19/2014)  ASSESSMENT & PLAN:  T1N0 SCCA of the left lower lobe Pleasant 63 year old smoker with stage I squamous cell carcinoma of the left lung. He has completed XRT with Dr. [name]. He tolerated therapy better than anticipated.  Additional follow-up of his lung cancer will be with Radiation Oncology.  Bladder cancer Mr. Nicolson was seen for  hematuria and evaluation has revealed a bladder cancer (high grade urothelial carcinoma) with invasion into the prostate. He is deemed a poor surgical candidate. He has limited functional capacity most likely from underlying pulmonary disease; he continues to smoke one ppd. PET imaging on 9/4 did not reveal any additional disease within the abdomen/pelvis or chest (As previously noted the lung lesion was a squamous cell primary)    For good local control of his disease Mr. Bulthuis would need concurrent XRT/chemotherapy with a platinum drug. Both the family and Dr. [name] have reservations about his quality of life during and post treatment.   I also addressed the possibility of chemotherapy first with a drug combination such as Carbo/Gemzar. I do think the could tolerate this. If he did well with initial chemotherapy we could readdress concurrent treatment. He and his family had multiple questions and we spent time answering them today.  I provided them with reading information about bladder cancer. They would like to return after the new year for final recommendations and to proceed with initial therapy.   Orders Placed This Encounter  Procedures  . CBC with Differential    Standing Status: Future     Number of Occurrences:      Standing Expiration Date: 01/31/2015  . Comprehensive metabolic panel    Standing Status: Future     Number of Occurrences:      Standing Expiration Date: 01/31/2015  . Vitamin B12    Standing Status: Future     Number  of Occurrences:      Standing Expiration Date: 01/31/2015  . Ferritin    Standing Status: Future     Number of Occurrences:      Standing Expiration Date: 01/31/2015    All questions were answered. The patient knows to call the clinic with any problems, questions or concerns.      Molli Hazard, MD 02/06/2014 12:35 PM

## 2014-01-30 ENCOUNTER — Encounter (HOSPITAL_COMMUNITY): Payer: Medicare Other | Attending: Hematology & Oncology | Admitting: Hematology & Oncology

## 2014-01-30 VITALS — BP 113/72 | HR 94 | Temp 98.7°F | Resp 18 | Ht 66.5 in | Wt 127.1 lb

## 2014-01-30 DIAGNOSIS — Z801 Family history of malignant neoplasm of trachea, bronchus and lung: Secondary | ICD-10-CM

## 2014-01-30 DIAGNOSIS — R109 Unspecified abdominal pain: Secondary | ICD-10-CM

## 2014-01-30 DIAGNOSIS — J984 Other disorders of lung: Secondary | ICD-10-CM

## 2014-01-30 DIAGNOSIS — R319 Hematuria, unspecified: Secondary | ICD-10-CM

## 2014-01-30 DIAGNOSIS — C679 Malignant neoplasm of bladder, unspecified: Secondary | ICD-10-CM | POA: Insufficient documentation

## 2014-01-30 DIAGNOSIS — Z720 Tobacco use: Secondary | ICD-10-CM | POA: Insufficient documentation

## 2014-01-30 DIAGNOSIS — C3432 Malignant neoplasm of lower lobe, left bronchus or lung: Secondary | ICD-10-CM

## 2014-01-30 DIAGNOSIS — F329 Major depressive disorder, single episode, unspecified: Secondary | ICD-10-CM

## 2014-01-30 NOTE — Patient Instructions (Signed)
[facility] Discharge Instructions  RECOMMENDATIONS MADE BY THE CONSULTANT AND ANY TEST RESULTS WILL BE SENT TO YOUR REFERRING PHYSICIAN.  I will see you back in 2 weeks with labs including CBC, CMP, B12 and folate and iron. Please call with problems or concerns prior to your next follow-up. I will be in touch with you regarding final treatment planning Happy Holidays!!  Thank you for choosing [facility] to provide your oncology and hematology care.  To afford each patient quality time with our providers, please arrive at least 15 minutes before your scheduled appointment time.  With your help, our goal is to use those 15 minutes to complete the necessary work-up to ensure our physicians have the information they need to help with your evaluation and healthcare recommendations.    Effective January 1st, 2014, we ask that you re-schedule your appointment with our physicians should you arrive 10 or more minutes late for your appointment.  We strive to give you quality time with our providers, and arriving late affects you and other patients whose appointments are after yours.    Again, thank you for choosing [facility].  Our hope is that these requests will decrease the amount of time that you wait before being seen by our physicians.       _____________________________________________________________  Should you have questions after your visit to [facility], please contact our office at [phone] between the hours of 8:30 a.m. and 5:00 p.m.  Voicemails left after 4:30 p.m. will not be returned until the following business day.  For prescription refill requests, have your pharmacy contact our office with your prescription refill request.

## 2014-01-31 ENCOUNTER — Telehealth (HOSPITAL_COMMUNITY): Payer: Self-pay

## 2014-01-31 NOTE — Telephone Encounter (Signed)
Notified that Dr. Whitney Muse will not be able to discuss case with Dr. [name] until Monday.

## 2014-02-06 ENCOUNTER — Encounter (HOSPITAL_COMMUNITY): Payer: Self-pay | Admitting: Hematology & Oncology

## 2014-02-06 NOTE — Assessment & Plan Note (Signed)
Pleasant 63 year old smoker with stage I squamous cell carcinoma of the left lung. He has completed XRT with Dr. [name]. He tolerated therapy better than anticipated.  Additional follow-up of his lung cancer will be with Radiation Oncology.

## 2014-02-06 NOTE — Assessment & Plan Note (Signed)
Brian Sims was seen for hematuria and evaluation has revealed a bladder cancer (high grade urothelial carcinoma) with invasion into the prostate. He is deemed a poor surgical candidate. He has limited functional capacity most likely from underlying pulmonary disease; he continues to smoke one ppd. PET imaging on 9/4 did not reveal any additional disease within the abdomen/pelvis or chest (As previously noted the lung lesion was a squamous cell primary)    For good local control of his disease Brian Sims would need concurrent XRT/chemotherapy with a platinum drug. Both the family and Dr. [name] have reservations about his quality of life during and post treatment.   I also addressed the possibility of chemotherapy first with a drug combination such as Carbo/Gemzar. I do think the could tolerate this. If he did well with initial chemotherapy we could readdress concurrent treatment. He and his family had multiple questions and we spent time answering them today.  I provided them with reading information about bladder cancer. They would like to return after the new year for final recommendations and to proceed with initial therapy.

## 2014-02-07 ENCOUNTER — Encounter (HOSPITAL_COMMUNITY): Payer: Medicare Other

## 2014-02-07 ENCOUNTER — Encounter (HOSPITAL_BASED_OUTPATIENT_CLINIC_OR_DEPARTMENT_OTHER): Payer: Medicare Other | Admitting: Hematology & Oncology

## 2014-02-07 VITALS — BP 109/74 | HR 98 | Temp 99.1°F | Resp 16 | Wt 125.6 lb

## 2014-02-07 DIAGNOSIS — R319 Hematuria, unspecified: Secondary | ICD-10-CM

## 2014-02-07 DIAGNOSIS — C679 Malignant neoplasm of bladder, unspecified: Secondary | ICD-10-CM | POA: Diagnosis present

## 2014-02-07 DIAGNOSIS — F329 Major depressive disorder, single episode, unspecified: Secondary | ICD-10-CM

## 2014-02-07 DIAGNOSIS — G893 Neoplasm related pain (acute) (chronic): Secondary | ICD-10-CM

## 2014-02-07 DIAGNOSIS — Z720 Tobacco use: Secondary | ICD-10-CM | POA: Diagnosis not present

## 2014-02-07 LAB — COMPREHENSIVE METABOLIC PANEL
ALK PHOS: 67 U/L (ref 39–117)
ALT: 11 U/L (ref 0–53)
AST: 18 U/L (ref 0–37)
Albumin: 4.2 g/dL (ref 3.5–5.2)
Anion gap: 5 (ref 5–15)
BILIRUBIN TOTAL: 0.6 mg/dL (ref 0.3–1.2)
BUN: 13 mg/dL (ref 6–23)
CHLORIDE: 106 meq/L (ref 96–112)
CO2: 28 mmol/L (ref 19–32)
Calcium: 9.3 mg/dL (ref 8.4–10.5)
Creatinine, Ser: 0.9 mg/dL (ref 0.50–1.35)
GFR calc non Af Amer: >90 mL/min (ref >90)
GLUCOSE: 103 mg/dL — AB (ref 70–99)
POTASSIUM: 3.4 mmol/L — AB (ref 3.5–5.1)
SODIUM: 139 mmol/L (ref 135–145)
Total Protein: 6.9 g/dL (ref 6.0–8.3)

## 2014-02-07 LAB — CBC WITH DIFFERENTIAL/PLATELET
BASOS ABS: 0 10*3/uL (ref 0.0–0.1)
BASOS PCT: 1 % (ref 0–1)
EOS ABS: 0.2 10*3/uL (ref 0.0–0.7)
EOS PCT: 3 % (ref 0–5)
HEMATOCRIT: 45.9 % (ref 39.0–52.0)
Hemoglobin: 15.3 g/dL (ref 13.0–17.0)
Lymphocytes Relative: 26 % (ref 12–46)
Lymphs Abs: 1.7 10*3/uL (ref 0.7–4.0)
MCH: 31.4 pg (ref 26.0–34.0)
MCHC: 33.3 g/dL (ref 30.0–36.0)
MCV: 94.3 fL (ref 78.0–100.0)
MONO ABS: 0.7 10*3/uL (ref 0.1–1.0)
Monocytes Relative: 10 % (ref 3–12)
Neutro Abs: 4 10*3/uL (ref 1.7–7.7)
Neutrophils Relative %: 60 % (ref 43–77)
PLATELETS: 231 10*3/uL (ref 150–400)
RBC: 4.87 MIL/uL (ref 4.22–5.81)
RDW: 13.5 % (ref 11.5–15.5)
WBC: 6.6 10*3/uL (ref 4.0–10.5)

## 2014-02-07 MED ORDER — FENTANYL 12 MCG/HR TD PT72
12.5000 ug | MEDICATED_PATCH | TRANSDERMAL | Status: DC
  Filled 2014-02-07: qty 1

## 2014-02-07 MED ORDER — FENTANYL 12 MCG/HR TD PT72: 12.5000 ug | MEDICATED_PATCH | TRANSDERMAL | Status: DC

## 2014-02-07 NOTE — Patient Instructions (Signed)
[facility] Discharge Instructions  RECOMMENDATIONS MADE BY THE CONSULTANT AND ANY TEST RESULTS WILL BE SENT TO YOUR REFERRING PHYSICIAN.  We are going to start your chemotherapy next week. We will arrange for chemotherapy teaching We are going to start you on a pain patch.  If after 48 hours your pain is not improved please remove the patch. Place 2 new ones. We are going to arrange for you to meet with our social worker to talk about some of your struggles.  Thank you for choosing [facility] to provide your oncology and hematology care.  To afford each patient quality time with our providers, please arrive at least 15 minutes before your scheduled appointment time.  With your help, our goal is to use those 15 minutes to complete the necessary work-up to ensure our physicians have the information they need to help with your evaluation and healthcare recommendations.    Effective January 1st, 2014, we ask that you re-schedule your appointment with our physicians should you arrive 10 or more minutes late for your appointment.  We strive to give you quality time with our providers, and arriving late affects you and other patients whose appointments are after yours.    Again, thank you for choosing [facility].  Our hope is that these requests will decrease the amount of time that you wait before being seen by our physicians.       _____________________________________________________________  Should you have questions after your visit to [facility], please contact our office at [phone] between the hours of 8:30 a.m. and 5:00 p.m.  Voicemails left after 4:30 p.m. will not be returned until the following business day.  For prescription refill requests, have your pharmacy contact our office with your prescription refill request.

## 2014-02-07 NOTE — Progress Notes (Signed)
Brian Sims presented for [employer]. Labs per MD order drawn via Peripheral Line 23 gauge needle inserted in left forearm.  Good blood return present. Procedure without incident.  Needle removed intact. Patient tolerated procedure well.

## 2014-02-07 NOTE — Progress Notes (Signed)
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 male returns for his bladder cancer.  He takes remeron for depression. His wife thinks he was at his best through radiation when he was getting out everyday.  He has a lot of pain in the pelvic area and pain medication helps, but he is afraid to take it.  "I don't want to become addicted."  No hematuria.    MEDICAL HISTORY: Past Medical History  Diagnosis Date  . COPD (chronic obstructive pulmonary disease)   . Inflammatory bowel disease   . Constipation   . Lesion of bladder   . Prostate mass   . Dysuria-frequency syndrome     wears depends  . Arthritis   . Shortness of breath   . Depression   . Anxiety   . Kidney stones   . GERD (gastroesophageal reflux disease)   . 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     No history exists.     is allergic to lyrica; gabapentin; and morphine and related.  Mr. Wirz had no medications administered during this visit.  SURGICAL HISTORY: Past Surgical History  Procedure Laterality Date  . Back surgery    . Left shoulder      arthroscopy  . Rt knee arthroscopy    . Colonoscopy N/A 01/11/2013    Procedure: COLONOSCOPY;  Surgeon: [name], MD;  Location: [facility];  Service: Endoscopy;  Laterality: N/A;  340  . Carpal tunnel release Right   . Transurethral resection of bladder tumor N/A 09/21/2013   Procedure: TRANSURETHRAL RESECTION  PROSTATE  AND BLADDER ;  Surgeon: [name], MD;  Location: [facility];  Service: Urology;  Laterality: N/A;  . Video bronchoscopy with endobronchial navigation Right 10/25/2013    Procedure: VIDEO BRONCHOSCOPY WITH ENDOBRONCHIAL NAVIGATION;  Surgeon: [name], MD;  Location: [facility];  Service: Thoracic;  Laterality: Right;  . Transurethral resection of bladder tumor with gyrus (turbt-gyrus) N/A 01/18/2014    Procedure: TRANSURETHRAL RESECTION OF BLADDER TUMOR WITH GYRUS (TURBT-GYRUS);  Surgeon: [name], MD;  Location: [facility];  Service: Urology;  Laterality: N/A;  . Transurethral resection of prostate N/A 01/18/2014    Procedure: TRANSURETHRAL RESECTION OF THE PROSTATE (TURP);  Surgeon: [name], MD;  Location: [facility];  Service: Urology;  Laterality: N/A;    SOCIAL HISTORY: History   Social History  . Marital Status: Married    Spouse Name: N/A    Number of Children: N/A  . Years of Education: N/A   Occupational History  . Not on file.   Social History Main Topics  . Smoking status: Current Every Day Smoker -- 1.00 packs/day for 40 years    Types: Cigarettes  . Smokeless tobacco: Never Used     Comment: 1 pack a day since age 39  . Alcohol Use: No  . Drug Use: No  .  Sexual Activity: Not on file   Other Topics Concern  . Not on file   Social History Narrative    FAMILY HISTORY: Family History  Problem Relation Age of Onset  . Diabetes Sister   . Cancer Brother     Review of Systems  Constitutional: Positive for malaise/fatigue.  HENT: Negative.   Eyes: Negative.   Respiratory: Positive for sputum production and shortness of breath. Negative for cough, hemoptysis and wheezing.   Cardiovascular: Negative.   Gastrointestinal: Positive for abdominal pain and constipation. Negative for heartburn, nausea, vomiting and diarrhea.  Genitourinary: Negative.   Musculoskeletal: Positive for myalgias. Negative  for back pain, joint pain, falls and neck pain.  Skin: Negative.   Neurological: Positive for weakness. Negative for dizziness, tingling, tremors, sensory change, speech change, focal weakness, seizures and loss of consciousness.  Endo/Heme/Allergies: Negative.   Psychiatric/Behavioral: Positive for depression. Negative for suicidal ideas, hallucinations, memory loss and substance abuse. The patient is nervous/anxious and has insomnia.     PHYSICAL EXAMINATION  ECOG PERFORMANCE STATUS: 1 - Symptomatic but completely ambulatory  Filed Vitals:   02/07/14 1400  BP: 109/74  Pulse: 98  Temp: 99.1 F (37.3 C)  Resp: 16    Physical Exam  Constitutional: He is oriented to person, place, and time and well-developed, well-nourished, and in no distress.  HENT:  Head: Normocephalic and atraumatic.  Nose: Nose normal.  Mouth/Throat: Oropharynx is clear and moist. No oropharyngeal exudate.  Eyes: Conjunctivae and EOM are normal. Pupils are equal, round, and reactive to light. Right eye exhibits no discharge. Left eye exhibits no discharge. No scleral icterus.  Neck: Normal range of motion. Neck supple. No tracheal deviation present. No thyromegaly present.  Cardiovascular: Normal rate, regular rhythm and normal heart sounds.  Exam reveals no gallop and no friction rub.   No murmur heard. Pulmonary/Chest: Effort normal and breath sounds normal. He has no wheezes. He has no rales.  Abdominal: Soft. Bowel sounds are normal. He exhibits no distension and no mass. There is no tenderness. There is no rebound and no guarding.  Musculoskeletal: Normal range of motion. He exhibits no edema.  Lymphadenopathy:    He has no cervical adenopathy.  Neurological: He is alert and oriented to person, place, and time. He has normal reflexes. No cranial nerve deficit. Gait normal. Coordination normal.  Skin: Skin is warm and dry. No rash noted.  Psychiatric: Mood, memory, affect and judgment normal.  Nursing  note and vitals reviewed.   LABORATORY DATA:  CBC    Component Value Date/Time   WBC 6.6 02/07/2014 1351   RBC 4.87 02/07/2014 1351   HGB 15.3 02/07/2014 1351   HCT 45.9 02/07/2014 1351   PLT 231 02/07/2014 1351   MCV 94.3 02/07/2014 1351   MCH 31.4 02/07/2014 1351   MCHC 33.3 02/07/2014 1351   RDW 13.5 02/07/2014 1351   LYMPHSABS 1.7 02/07/2014 1351   MONOABS 0.7 02/07/2014 1351   EOSABS 0.2 02/07/2014 1351   BASOSABS 0.0 02/07/2014 1351   CMP     Component Value Date/Time   NA 139 02/07/2014 1351   K 3.4* 02/07/2014 1351   CL 106 02/07/2014 1351   CO2 28 02/07/2014 1351   GLUCOSE 103* 02/07/2014 1351   BUN 13 02/07/2014 1351   CREATININE 0.90 02/07/2014 1351   CALCIUM 9.3 02/07/2014 1351   PROT 6.9 02/07/2014 1351   ALBUMIN 4.2 02/07/2014 1351   AST 18 02/07/2014 1351   ALT 11 02/07/2014 1351  ALKPHOS 67 02/07/2014 1351   BILITOT 0.6 02/07/2014 1351   GFRNONAA >90 02/07/2014 1351   GFRAA >90 02/07/2014 1351      ASSESSMENT and THERAPY PLAN:    Bladder cancer 63 year old male with poorly differentiated urothelial carcinoma of the bladder, muscle invasive with invasion in to the prostate.  After a discussion with Dr. [name] and the patient/patient's family we have opted for chemotherapy with Carboplatin/Gemzar. We had a long discussion of risks/benefits to treatment. He wishes to proceed. I believe he can tolerate treatment.  If he does well, disease improves, pelvic pain improves, we can revisit the addition of XRT for good loco-regional control.  We spent a good deal of time today discussing his social issues.  He is clearly depressed.  His wife states that he was improving during radiation because he was getting out everyday.  We discussed working with out Education officer, museum and referral to psychiatry and he is agreeable.  Neoplasm related pain I spent time today discussing the rationale behind good pain control.  The limitations associated with short acting  pain medications.  I addressed his concerns regarding "addiction."  I believe his lower abdominal pain/pelvic pain is malignancy related and good pain control will help improve his PS.  He states a reason for not going out with his family is poor pain control.  We will start him on duragesic 12 mcg/hr, he can use norco for breakthrough.  We will increase his fentanyl as needed. We will reassess his pain next week at his first chemotherapy visit.    All questions were answered. The patient knows to call the clinic with any problems, questions or concerns. We can certainly see the patient much sooner if necessary.   Molli Hazard 02/11/2014

## 2014-02-07 NOTE — Progress Notes (Signed)
See office visit note

## 2014-02-08 LAB — VITAMIN B12: VITAMIN B 12: 737 pg/mL (ref 211–911)

## 2014-02-08 LAB — FERRITIN: Ferritin: 144 ng/mL (ref 22–322)

## 2014-02-11 ENCOUNTER — Encounter (HOSPITAL_COMMUNITY): Payer: Self-pay | Admitting: Hematology & Oncology

## 2014-02-11 DIAGNOSIS — G893 Neoplasm related pain (acute) (chronic): Secondary | ICD-10-CM | POA: Insufficient documentation

## 2014-02-11 MED ORDER — ONDANSETRON HCL 8 MG PO TABS: 8.0000 mg | ORAL_TABLET | Freq: Three times a day (TID) | ORAL | Status: DC | PRN

## 2014-02-11 MED ORDER — PROCHLORPERAZINE MALEATE 10 MG PO TABS: 10.0000 mg | ORAL_TABLET | Freq: Four times a day (QID) | ORAL | Status: DC | PRN

## 2014-02-11 NOTE — Patient Instructions (Signed)
[facility]   CHEMOTHERAPY INSTRUCTIONS  Premeds: Zofran - this is an anti-nausea medication. This is given to reduce/prevent nausea/vomiting. Dexamethasone - this is a steroid. This is to further reduce your risk of having nausea/vomiting. Side effects of Dexamethasone include: increase in energy, trouble sleeping, and in some people it can turn their face/neck/chest areas red or make them feel flushed in the face/neck/chest areas. This will go away as the drug wears off. (these two medications are given together and take 30 minutes to infuse)  Carboplatin - this medication can be hard on your kidneys - this is why we need you to drink 64 oz of fluid (preferably water/decaff fluids) 2 days prior to chemo and for up to 4-5 days after chemo. Drink more if you can. This will help to keep your kidneys flushed. This can cause mild hair loss, lower your platelets (which make your blood clot), lower your white blood cells (fight infection), and cause nausea/vomiting.   (this medication takes 30 minutes to infuse)  Gemcitabine - bone marrow suppression (lowers white blood cells (fight infection), lowers red blood cells (make up your blood), lowers platelets (help blood to clot). Nausea/vomiting,fever, flu-like symptoms, rash. (this medication takes 30 minutes to infuse)  We will draw blood prior to every chemotherapy to make sure that your blood counts are within a level that is acceptable for treatment with chemotherapy. Chemotherapy administration times will vary each time you come depending on how backed up the laboratory and/or pharmacy is. Your chemotherapy drugs are not ordered and mixed by the pharmacist until we know what your blood counts are.   POTENTIAL SIDE EFFECTS OF TREATMENT: Increased Susceptibility to Infection, Vomiting, Constipation, Hair Thinning, Changes in Character of Skin and Nails (brittleness, dryness,etc.), Pigment Changes (darkening of, nail beds,  palms of hands, soles of feet, etc.), Bone Marrow Suppression, Abdominal Cramping, Nausea, Diarrhea, Sun Sensitivity and Mouth Sores   SELF IMAGE NEEDS AND REFERRALS MADE: Obtain hair accessories as soon as possible (hats,etc.)   EDUCATIONAL MATERIALS GIVEN AND REVIEWED: Chemotherapy and You Booklet Specific Instruction Sheets: Carboplatin, Gemzar, Zofran, Dexamethasone, EMLA cream, Compazine, Port-a-Cath, Bladder Cancer, Chemotherapy   SELF CARE ACTIVITIES WHILE ON CHEMOTHERAPY: Increase your fluid intake 48 hours prior to treatment and drink at least 2 quarts per day after treatment., No alcohol intake., No aspirin or other medications unless approved by your oncologist., Eat foods that are light and easy to digest., Eat foods at cold or room temperature., No fried, fatty, or spicy foods immediately before or after treatment., Have teeth cleaned professionally before starting treatment. Keep dentures and partial plates clean., Use soft toothbrush and do not use mouthwashes that contain alcohol. Biotene is a good mouthwash that is available at most pharmacies or may be ordered by calling [phone]., Use warm salt water gargles (1 teaspoon salt per 1 quart warm water) before and after meals and at bedtime. Or you may rinse with 2 tablespoons of three -percent hydrogen peroxide mixed in eight ounces of water., Always use sunscreen with SPF (Sun Protection Factor) of 30 or higher., Use your nausea medication as directed to prevent nausea., Use your stool softener or laxative as directed to prevent constipation. and Use your anti-diarrheal medication as directed to stop diarrhea.  Please wash your hands for at least 30 seconds using warm soapy water. Handwashing is the #1 way to prevent the spread of germs. Stay away from sick people or people who are getting over a cold.  If you develop respiratory systems such as green/yellow mucus production or productive cough or persistent cough let us know and  we will see if you need an antibiotic. It is a good idea to keep a pair of gloves on when going into grocery stores/[employer] to decrease your risk of coming into contact with germs on the carts, etc. Carry alcohol hand gel with you at all times and use it frequently if out in public. All foods need to be cooked thoroughly. No raw foods. No medium or undercooked meats, eggs. If your food is cooked medium well, it does not need to be hot pink or saturated with bloody liquid at all. Vegetables and fruits need to be washed/rinsed under the faucet with a dish detergent before being consumed. You can eat raw fruits and vegetables unless we tell you otherwise but it would be best if you cooked them or bought frozen. Do not eat off of salad bars or hot bars unless you really trust the cleanliness of the restaurant. If you need dental work, please let Dr. Whitney Muse know before you go for your appointment so that we can coordinate the best possible time for you in regards to your chemo regimen. You need to also let your dentist know that you are actively taking chemo. We may need to do labs prior to your dental appointment. We also want your bowels moving at least every other day. If this is not happening, we need to know so that we can get you on a bowel regimen to help you go.    MEDICATIONS: You have been given prescriptions for the following medications:  Ondansetron/Zofran 8mg  tablet. Take 1 tablet every 8 hours as needed for nausea/vomiting.   Compazine/Prochlorperazine 10mg  tablet. Take 1 tablet every 6 hours as needed for nausea/vomiting.    Over-the-Counter Meds:  Colace - this is a stool softener. Take 100mg  capsule 2-6 times a day as needed. If you have to take more than 6 capsules of Colace a day call the [facility].  Senna - this is a mild laxative used to treat mild constipation. May take 1-4 tabs by mouth twice a day (total of 8 tablets daily) as needed for mild constipation.  Milk of Magnesia  - this is a laxative used to treat moderate to severe constipation. May take 2-4 tablespoons every 8 hours as needed. May increase to 8 tablespoons x 1 dose and if no bowel movement call the [facility].  Miralax. Take 17 grams (1 capful) daily or as needed for constipation.   Imodium - this is for diarrhea. Take 2 tabs after 1st loose stool and then 1 tab every 2 hours until you go a total of 12 hours without a loose stool. Call [facility] if loose stools continue.   SYMPTOMS TO REPORT AS SOON AS POSSIBLE AFTER TREATMENT:  FEVER GREATER THAN 100.5 F  CHILLS WITH OR WITHOUT FEVER  NAUSEA AND VOMITING THAT IS NOT CONTROLLED WITH YOUR NAUSEA MEDICATION  UNUSUAL SHORTNESS OF BREATH  UNUSUAL BRUISING OR BLEEDING  TENDERNESS IN MOUTH AND THROAT WITH OR WITHOUT PRESENCE OF ULCERS  URINARY PROBLEMS  BOWEL PROBLEMS  UNUSUAL RASH    Wear comfortable clothing and clothing appropriate for easy access to any Portacath or PICC line. Let us know if there is anything that we can do to make your therapy better!      I have been informed and understand all of the instructions given to me and have received a copy.  I have been instructed to call the clinic [phone] or my family physician as soon as possible for continued medical care, if indicated. I do not have any more questions at this time but understand that I may call the [facility] or the Patient Navigator at [phone] during office hours should I have questions or need assistance in obtaining follow-up care.            Carboplatin injection What is this medicine? CARBOPLATIN (KAR boe pla tin) is a chemotherapy drug. It targets fast dividing cells, like cancer cells, and causes these cells to die. This medicine is used to treat ovarian cancer and many other cancers. This medicine may be used for other purposes; ask your health care provider or pharmacist if you have questions. COMMON BRAND NAME(S):  Paraplatin What should I tell my health care provider before I take this medicine? They need to know if you have any of these conditions: -blood disorders -hearing problems -kidney disease -recent or ongoing radiation therapy -an unusual or allergic reaction to carboplatin, cisplatin, other chemotherapy, other medicines, foods, dyes, or preservatives -pregnant or trying to get pregnant -breast-feeding How should I use this medicine? This drug is usually given as an infusion into a vein. It is administered in a hospital or clinic by a specially trained health care professional. Talk to your pediatrician regarding the use of this medicine in children. Special care may be needed. Overdosage: If you think you have taken too much of this medicine contact a poison control center or emergency room at once. NOTE: This medicine is only for you. Do not share this medicine with others. What if I miss a dose? It is important not to miss a dose. Call your doctor or health care professional if you are unable to keep an appointment. What may interact with this medicine? -medicines for seizures -medicines to increase blood counts like filgrastim, pegfilgrastim, sargramostim -some antibiotics like amikacin, gentamicin, neomycin, streptomycin, tobramycin -vaccines Talk to your doctor or health care professional before taking any of these medicines: -acetaminophen -aspirin -ibuprofen -ketoprofen -naproxen This list may not describe all possible interactions. Give your health care provider a list of all the medicines, herbs, non-prescription drugs, or dietary supplements you use. Also tell them if you smoke, drink alcohol, or use illegal drugs. Some items may interact with your medicine. What should I watch for while using this medicine? Your condition will be monitored carefully while you are receiving this medicine. You will need important blood work done while you are taking this medicine. This drug  may make you feel generally unwell. This is not uncommon, as chemotherapy can affect healthy cells as well as cancer cells. Report any side effects. Continue your course of treatment even though you feel ill unless your doctor tells you to stop. In some cases, you may be given additional medicines to help with side effects. Follow all directions for their use. Call your doctor or health care professional for advice if you get a fever, chills or sore throat, or other symptoms of a cold or flu. Do not treat yourself. This drug decreases your body's ability to fight infections. Try to avoid being around people who are sick. This medicine may increase your risk to bruise or bleed. Call your doctor or health care professional if you notice any unusual bleeding. Be careful brushing and flossing your teeth or using a toothpick because you may get an infection or bleed more easily. If you have any dental  work done, tell your dentist you are receiving this medicine. Avoid taking products that contain aspirin, acetaminophen, ibuprofen, naproxen, or ketoprofen unless instructed by your doctor. These medicines may hide a fever. Do not become pregnant while taking this medicine. Women should inform their doctor if they wish to become pregnant or think they might be pregnant. There is a potential for serious side effects to an unborn child. Talk to your health care professional or pharmacist for more information. Do not breast-feed an infant while taking this medicine. What side effects may I notice from receiving this medicine? Side effects that you should report to your doctor or health care professional as soon as possible: -allergic reactions like skin rash, itching or hives, swelling of the face, lips, or tongue -signs of infection - fever or chills, cough, sore throat, pain or difficulty passing urine -signs of decreased platelets or bleeding - bruising, pinpoint red spots on the skin, black, tarry stools,  nosebleeds -signs of decreased red blood cells - unusually weak or tired, fainting spells, lightheadedness -breathing problems -changes in hearing -changes in vision -chest pain -high blood pressure -low blood counts - This drug may decrease the number of white blood cells, red blood cells and platelets. You may be at increased risk for infections and bleeding. -nausea and vomiting -pain, swelling, redness or irritation at the injection site -pain, tingling, numbness in the hands or feet -problems with balance, talking, walking -trouble passing urine or change in the amount of urine Side effects that usually do not require medical attention (report to your doctor or health care professional if they continue or are bothersome): -hair loss -loss of appetite -metallic taste in the mouth or changes in taste This list may not describe all possible side effects. Call your doctor for medical advice about side effects. You may report side effects to FDA at 1-800-FDA-1088. Where should I keep my medicine? This drug is given in a hospital or clinic and will not be stored at home. NOTE: This sheet is a summary. It may not cover all possible information. If you have questions about this medicine, talk to your doctor, pharmacist, or health care provider.  2015, Elsevier/[employer] Standard. (2007-05-03 14:38:05) Gemcitabine injection What is this medicine? GEMCITABINE (jem SIT a been) is a chemotherapy drug. This medicine is used to treat many types of cancer like breast cancer, lung cancer, pancreatic cancer, and ovarian cancer. This medicine may be used for other purposes; ask your health care provider or pharmacist if you have questions. COMMON BRAND NAME(S): Gemzar What should I tell my health care provider before I take this medicine? They need to know if you have any of these conditions: -blood disorders -infection -kidney disease -liver disease -recent or ongoing radiation therapy -an unusual  or allergic reaction to gemcitabine, other chemotherapy, other medicines, foods, dyes, or preservatives -pregnant or trying to get pregnant -breast-feeding How should I use this medicine? This drug is given as an infusion into a vein. It is administered in a hospital or clinic by a specially trained health care professional. Talk to your pediatrician regarding the use of this medicine in children. Special care may be needed. Overdosage: If you think you have taken too much of this medicine contact a poison control center or emergency room at once. NOTE: This medicine is only for you. Do not share this medicine with others. What if I miss a dose? It is important not to miss your dose. Call your doctor or health care professional if  you are unable to keep an appointment. What may interact with this medicine? -medicines to increase blood counts like filgrastim, pegfilgrastim, sargramostim -some other chemotherapy drugs like cisplatin -vaccines Talk to your doctor or health care professional before taking any of these medicines: -acetaminophen -aspirin -ibuprofen -ketoprofen -naproxen This list may not describe all possible interactions. Give your health care provider a list of all the medicines, herbs, non-prescription drugs, or dietary supplements you use. Also tell them if you smoke, drink alcohol, or use illegal drugs. Some items may interact with your medicine. What should I watch for while using this medicine? Visit your doctor for checks on your progress. This drug may make you feel generally unwell. This is not uncommon, as chemotherapy can affect healthy cells as well as cancer cells. Report any side effects. Continue your course of treatment even though you feel ill unless your doctor tells you to stop. In some cases, you may be given additional medicines to help with side effects. Follow all directions for their use. Call your doctor or health care professional for advice if you get a  fever, chills or sore throat, or other symptoms of a cold or flu. Do not treat yourself. This drug decreases your body's ability to fight infections. Try to avoid being around people who are sick. This medicine may increase your risk to bruise or bleed. Call your doctor or health care professional if you notice any unusual bleeding. Be careful brushing and flossing your teeth or using a toothpick because you may get an infection or bleed more easily. If you have any dental work done, tell your dentist you are receiving this medicine. Avoid taking products that contain aspirin, acetaminophen, ibuprofen, naproxen, or ketoprofen unless instructed by your doctor. These medicines may hide a fever. Women should inform their doctor if they wish to become pregnant or think they might be pregnant. There is a potential for serious side effects to an unborn child. Talk to your health care professional or pharmacist for more information. Do not breast-feed an infant while taking this medicine. What side effects may I notice from receiving this medicine? Side effects that you should report to your doctor or health care professional as soon as possible: -allergic reactions like skin rash, itching or hives, swelling of the face, lips, or tongue -low blood counts - this medicine may decrease the number of white blood cells, red blood cells and platelets. You may be at increased risk for infections and bleeding. -signs of infection - fever or chills, cough, sore throat, pain or difficulty passing urine -signs of decreased platelets or bleeding - bruising, pinpoint red spots on the skin, black, tarry stools, blood in the urine -signs of decreased red blood cells - unusually weak or tired, fainting spells, lightheadedness -breathing problems -chest pain -mouth sores -nausea and vomiting -pain, swelling, redness at site where injected -pain, tingling, numbness in the hands or feet -stomach pain -swelling of ankles,  feet, hands -unusual bleeding Side effects that usually do not require medical attention (report to your doctor or health care professional if they continue or are bothersome): -constipation -diarrhea -hair loss -loss of appetite -stomach upset This list may not describe all possible side effects. Call your doctor for medical advice about side effects. You may report side effects to FDA at 1-800-FDA-1088. Where should I keep my medicine? This drug is given in a hospital or clinic and will not be stored at home. NOTE: This sheet is a summary. It may not cover  all possible information. If you have questions about this medicine, talk to your doctor, pharmacist, or health care provider.  2015, [employer] Standard. (2007-06-07 18:45:54) Dexamethasone injection What is this medicine? DEXAMETHASONE (dex a METH a sone) is a corticosteroid. It is used to treat inflammation of the skin, joints, lungs, and other organs. Common conditions treated include asthma, allergies, and arthritis. It is also used for other conditions, like blood disorders and diseases of the adrenal glands. This medicine may be used for other purposes; ask your health care provider or pharmacist if you have questions. COMMON BRAND NAME(S): Decadron, Solurex What should I tell my health care provider before I take this medicine? They need to know if you have any of these conditions: -blood clotting problems -Cushing's syndrome -diabetes -glaucoma -heart problems or disease -high blood pressure -infection like herpes, measles, tuberculosis, or chickenpox -kidney disease -liver disease -mental problems -myasthenia gravis -osteoporosis -previous heart attack -seizures -stomach, ulcer or intestine disease including colitis and diverticulitis -thyroid problem -an unusual or allergic reaction to dexamethasone, corticosteroids, other medicines, lactose, foods, dyes, or preservatives -pregnant or trying to get  pregnant -breast-feeding How should I use this medicine? This medicine is for injection into a muscle, joint, lesion, soft tissue, or vein. It is given by a health care professional in a hospital or clinic setting. Talk to your pediatrician regarding the use of this medicine in children. Special care may be needed. Overdosage: If you think you have taken too much of this medicine contact a poison control center or emergency room at once. NOTE: This medicine is only for you. Do not share this medicine with others. What if I miss a dose? This may not apply. If you are having a series of injections over a prolonged period, try not to miss an appointment. Call your doctor or health care professional to reschedule if you are unable to keep an appointment. What may interact with this medicine? Do not take this medicine with any of the following medications: -mifepristone, RU-486 -vaccines This medicine may also interact with the following medications: -amphotericin B -antibiotics like clarithromycin, erythromycin, and troleandomycin -aspirin and aspirin-like drugs -barbiturates like phenobarbital -carbamazepine -cholestyramine -cholinesterase inhibitors like donepezil, galantamine, rivastigmine, and tacrine -cyclosporine -digoxin -diuretics -ephedrine -male hormones, like estrogens or progestins and birth control pills -indinavir -isoniazid -ketoconazole -medicines for diabetes -medicines that improve muscle tone or strength for conditions like myasthenia gravis -NSAIDs, medicines for pain and inflammation, like ibuprofen or naproxen -phenytoin -rifampin -thalidomide -warfarin This list may not describe all possible interactions. Give your health care provider a list of all the medicines, herbs, non-prescription drugs, or dietary supplements you use. Also tell them if you smoke, drink alcohol, or use illegal drugs. Some items may interact with your medicine. What should I watch for  while using this medicine? Your condition will be monitored carefully while you are receiving this medicine. If you are taking this medicine for a long time, carry an identification card with your name and address, the type and dose of your medicine, and your doctor's name and address. This medicine may increase your risk of getting an infection. Stay away from people who are sick. Tell your doctor or health care professional if you are around anyone with measles or chickenpox. Talk to your health care provider before you get any vaccines that you take this medicine. If you are going to have surgery, tell your doctor or health care professional that you have taken this medicine within the last twelve months.  Ask your doctor or health care professional about your diet. You may need to lower the amount of salt you eat. The medicine can increase your blood sugar. If you are a diabetic check with your doctor if you need help adjusting the dose of your diabetic medicine. What side effects may I notice from receiving this medicine? Side effects that you should report to your doctor or health care professional as soon as possible: -allergic reactions like skin rash, itching or hives, swelling of the face, lips, or tongue -black or tarry stools -change in the amount of urine -changes in vision -confusion, excitement, restlessness, a false sense of well-being -fever, sore throat, sneezing, cough, or other signs of infection, wounds that will not heal -hallucinations -increased thirst -mental depression, mood swings, mistaken feelings of self importance or of being mistreated -pain in hips, back, ribs, arms, shoulders, or legs -pain, redness, or irritation at the injection site -redness, blistering, peeling or loosening of the skin, including inside the mouth -rounding out of face -swelling of feet or lower legs -unusual bleeding or bruising -unusual tired or weak -wounds that do not heal Side  effects that usually do not require medical attention (report to your doctor or health care professional if they continue or are bothersome): -diarrhea or constipation -change in taste -headache -nausea, vomiting -skin problems, acne, thin and shiny skin -touble sleeping -unusual growth of hair on the face or body -weight gain This list may not describe all possible side effects. Call your doctor for medical advice about side effects. You may report side effects to FDA at 1-800-FDA-1088. Where should I keep my medicine? This drug is given in a hospital or clinic and will not be stored at home. NOTE: This sheet is a summary. It may not cover all possible information. If you have questions about this medicine, talk to your doctor, pharmacist, or health care provider.  2015, [employer] Standard. (2007-05-19 14:04:12) Ondansetron injection What is this medicine? ONDANSETRON (on DAN se tron) is used to treat nausea and vomiting caused by chemotherapy. It is also used to prevent or treat nausea and vomiting after surgery. This medicine may be used for other purposes; ask your health care provider or pharmacist if you have questions. COMMON BRAND NAME(S): Zofran What should I tell my health care provider before I take this medicine? They need to know if you have any of these conditions: -heart disease -history of irregular heartbeat -liver disease -low levels of magnesium or potassium in the blood -an unusual or allergic reaction to ondansetron, granisetron, other medicines, foods, dyes, or preservatives -pregnant or trying to get pregnant -breast-feeding How should I use this medicine? This medicine is for infusion into a vein. It is given by a health care professional in a hospital or clinic setting. Talk to your pediatrician regarding the use of this medicine in children. Special care may be needed. Overdosage: If you think you have taken too much of this medicine contact a poison  control center or emergency room at once. NOTE: This medicine is only for you. Do not share this medicine with others. What if I miss a dose? This does not apply. What may interact with this medicine? Do not take this medicine with any of the following medications: -apomorphine -certain medicines for fungal infections like fluconazole, itraconazole, ketoconazole, posaconazole, voriconazole -cisapride -dofetilide -dronedarone -pimozide -thioridazine -ziprasidone This medicine may also interact with the following medications: -carbamazepine -certain medicines for depression, anxiety, or psychotic disturbances -fentanyl -linezolid -MAOIs like Carbex,  Eldepryl, Marplan, Nardil, and Parnate -methylene blue (injected into a vein) -other medicines that prolong the QT interval (cause an abnormal heart rhythm) -phenytoin -rifampicin -tramadol This list may not describe all possible interactions. Give your health care provider a list of all the medicines, herbs, non-prescription drugs, or dietary supplements you use. Also tell them if you smoke, drink alcohol, or use illegal drugs. Some items may interact with your medicine. What should I watch for while using this medicine? Your condition will be monitored carefully while you are receiving this medicine. What side effects may I notice from receiving this medicine? Side effects that you should report to your doctor or health care professional as soon as possible: -allergic reactions like skin rash, itching or hives, swelling of the face, lips, or tongue -breathing problems -confusion -dizziness -fast or irregular heartbeat -feeling faint or lightheaded, falls -fever and chills -loss of balance or coordination -seizures -sweating -swelling of the hands and feet -tightness in the chest -tremors -unusually weak or tired Side effects that usually do not require medical attention (report to your doctor or health care professional if they  continue or are bothersome): -constipation or diarrhea -headache This list may not describe all possible side effects. Call your doctor for medical advice about side effects. You may report side effects to FDA at 1-800-FDA-1088. Where should I keep my medicine? This drug is given in a hospital or clinic and will not be stored at home. NOTE: This sheet is a summary. It may not cover all possible information. If you have questions about this medicine, talk to your doctor, pharmacist, or health care provider.  2015, [employer]. (2012-11-02 16:18:28) Prochlorperazine tablets What is this medicine? PROCHLORPERAZINE (proe klor PER a zeen) helps to control severe nausea and vomiting. This medicine is also used to treat schizophrenia. It can also help patients who experience anxiety that is not due to psychological illness. This medicine may be used for other purposes; ask your health care provider or pharmacist if you have questions. COMMON BRAND NAME(S): Compazine What should I tell my health care provider before I take this medicine? They need to know if you have any of these conditions: -blood disorders or disease -dementia -liver disease or jaundice -Parkinson's disease -uncontrollable movement disorder -an unusual or allergic reaction to prochlorperazine, other medicines, foods, dyes, or preservatives -pregnant or trying to get pregnant -breast-feeding How should I use this medicine? Take this medicine by mouth with a glass of water. Follow the directions on the prescription label. Take your doses at regular intervals. Do not take your medicine more often than directed. Do not stop taking this medicine suddenly. This can cause nausea, vomiting, and dizziness. Ask your doctor or health care professional for advice. Talk to your pediatrician regarding the use of this medicine in children. Special care may be needed. While this drug may be prescribed for children as young as 2 years  for selected conditions, precautions do apply. Overdosage: If you think you have taken too much of this medicine contact a poison control center or emergency room at once. NOTE: This medicine is only for you. Do not share this medicine with others. What if I miss a dose? If you miss a dose, take it as soon as you can. If it is almost time for your next dose, take only that dose. Do not take double or extra doses. What may interact with this medicine? Do not take this medicine with any of the following medications: -amoxapine -antidepressants like  citalopram, escitalopram, fluoxetine, paroxetine, and sertraline -deferoxamine -dofetilide -maprotiline -tricyclic antidepressants like amitriptyline, clomipramine, imipramine, nortiptyline and others This medicine may also interact with the following medications: -lithium -medicines for pain -phenytoin -propranolol -warfarin This list may not describe all possible interactions. Give your health care provider a list of all the medicines, herbs, non-prescription drugs, or dietary supplements you use. Also tell them if you smoke, drink alcohol, or use illegal drugs. Some items may interact with your medicine. What should I watch for while using this medicine? Visit your doctor or health care professional for regular checks on your progress. You may get drowsy or dizzy. Do not drive, use machinery, or do anything that needs mental alertness until you know how this medicine affects you. Do not stand or sit up quickly, especially if you are an older patient. This reduces the risk of dizzy or fainting spells. Alcohol may interfere with the effect of this medicine. Avoid alcoholic drinks. This medicine can reduce the response of your body to heat or cold. Dress warm in cold weather and stay hydrated in hot weather. If possible, avoid extreme temperatures like saunas, hot tubs, very hot or cold showers, or activities that can cause dehydration such as  vigorous exercise. This medicine can make you more sensitive to the sun. Keep out of the sun. If you cannot avoid being in the sun, wear protective clothing and use sunscreen. Do not use sun lamps or tanning beds/booths. Your mouth may get dry. Chewing sugarless gum or sucking hard candy, and drinking plenty of water may help. Contact your doctor if the problem does not go away or is severe. What side effects may I notice from receiving this medicine? Side effects that you should report to your doctor or health care professional as soon as possible: -blurred vision -breast enlargement in men or women -breast milk in women who are not breast-feeding -chest pain, fast or irregular heartbeat -confusion, restlessness -dark yellow or brown urine -difficulty breathing or swallowing -dizziness or fainting spells -drooling, shaking, movement difficulty (shuffling walk) or rigidity -fever, chills, sore throat -involuntary or uncontrollable movements of the eyes, mouth, head, arms, and legs -seizures -stomach area pain -unusually weak or tired -unusual bleeding or bruising -yellowing of skin or eyes Side effects that usually do not require medical attention (report to your doctor or health care professional if they continue or are bothersome): -difficulty passing urine -difficulty sleeping -headache -sexual dysfunction -skin rash, or itching This list may not describe all possible side effects. Call your doctor for medical advice about side effects. You may report side effects to FDA at 1-800-FDA-1088. Where should I keep my medicine? Keep out of the reach of children. Store at room temperature between 15 and 30 degrees C (59 and 86 degrees F). Protect from light. Throw away any unused medicine after the expiration date. NOTE: This sheet is a summary. It may not cover all possible information. If you have questions about this medicine, talk to your doctor, pharmacist, or health care provider.   2015, [employer] Standard. (2011-06-16 16:59:39) Ondansetron tablets What is this medicine? ONDANSETRON (on DAN se tron) is used to treat nausea and vomiting caused by chemotherapy. It is also used to prevent or treat nausea and vomiting after surgery. This medicine may be used for other purposes; ask your health care provider or pharmacist if you have questions. COMMON BRAND NAME(S): Zofran What should I tell my health care provider before I take this medicine? They need to know if you  have any of these conditions: -heart disease -history of irregular heartbeat -liver disease -low levels of magnesium or potassium in the blood -an unusual or allergic reaction to ondansetron, granisetron, other medicines, foods, dyes, or preservatives -pregnant or trying to get pregnant -breast-feeding How should I use this medicine? Take this medicine by mouth with a glass of water. Follow the directions on your prescription label. Take your doses at regular intervals. Do not take your medicine more often than directed. Talk to your pediatrician regarding the use of this medicine in children. Special care may be needed. Overdosage: If you think you have taken too much of this medicine contact a poison control center or emergency room at once. NOTE: This medicine is only for you. Do not share this medicine with others. What if I miss a dose? If you miss a dose, take it as soon as you can. If it is almost time for your next dose, take only that dose. Do not take double or extra doses. What may interact with this medicine? Do not take this medicine with any of the following medications: -apomorphine -certain medicines for fungal infections like fluconazole, itraconazole, ketoconazole, posaconazole, voriconazole -cisapride -dofetilide -dronedarone -pimozide -thioridazine -ziprasidone This medicine may also interact with the following medications: -carbamazepine -certain medicines for depression,  anxiety, or psychotic disturbances -fentanyl -linezolid -MAOIs like Carbex, Eldepryl, Marplan, Nardil, and Parnate -methylene blue (injected into a vein) -other medicines that prolong the QT interval (cause an abnormal heart rhythm) -phenytoin -rifampicin -tramadol This list may not describe all possible interactions. Give your health care provider a list of all the medicines, herbs, non-prescription drugs, or dietary supplements you use. Also tell them if you smoke, drink alcohol, or use illegal drugs. Some items may interact with your medicine. What should I watch for while using this medicine? Check with your doctor or health care professional right away if you have any sign of an allergic reaction. What side effects may I notice from receiving this medicine? Side effects that you should report to your doctor or health care professional as soon as possible: -allergic reactions like skin rash, itching or hives, swelling of the face, lips or tongue -breathing problems -confusion -dizziness -fast or irregular heartbeat -feeling faint or lightheaded, falls -fever and chills -loss of balance or coordination -seizures -sweating -swelling of the hands or feet -tightness in the chest -tremors -unusually weak or tired Side effects that usually do not require medical attention (report to your doctor or health care professional if they continue or are bothersome): -constipation or diarrhea -headache This list may not describe all possible side effects. Call your doctor for medical advice about side effects. You may report side effects to FDA at 1-800-FDA-1088. Where should I keep my medicine? Keep out of the reach of children. Store between 2 and 30 degrees C (36 and 86 degrees F). Throw away any unused medicine after the expiration date. NOTE: This sheet is a summary. It may not cover all possible information. If you have questions about this medicine, talk to your doctor, pharmacist, or  health care provider.  2015, [employer] Standard. (2012-11-02 16:27:45) Lidocaine; Prilocaine cream What is this medicine? LIDOCAINE; PRILOCAINE (LYE doe kane; PRIL oh kane) is a topical anesthetic that causes loss of feeling in the skin and surrounding tissues. It is used to numb the skin before procedures or injections. This medicine may be used for other purposes; ask your health care provider or pharmacist if you have questions. COMMON BRAND NAME(S): EMLA What  should I tell my health care provider before I take this medicine? They need to know if you have any of these conditions: -glucose-6-phosphate deficiencies -heart disease -kidney or liver disease -methemoglobinemia -an unusual or allergic reaction to lidocaine, prilocaine, other medicines, foods, dyes, or preservatives -pregnant or trying to get pregnant -breast-feeding How should I use this medicine? This medicine is for external use only on the skin. Do not take by mouth. Follow the directions on the prescription label. Wash hands before and after use. Do not use more or leave in contact with the skin longer than directed. Do not apply to eyes or open wounds. It can cause irritation and blurred or temporary loss of vision. If this medicine comes in contact with your eyes, immediately rinse the eye with water. Do not touch or rub the eye. Contact your health care provider right away. Talk to your pediatrician regarding the use of this medicine in children. While this medicine may be prescribed for children for selected conditions, precautions do apply. Overdosage: If you think you have taken too much of this medicine contact a poison control center or emergency room at once. NOTE: This medicine is only for you. Do not share this medicine with others. What if I miss a dose? This medicine is usually only applied once prior to each procedure. It must be in contact with the skin for a period of time for it to work. If you applied  this medicine later than directed, tell your health care professional before starting the procedure. What may interact with this medicine? -acetaminophen -chloroquine -dapsone -medicines to control heart rhythm -nitrates like nitroglycerin and nitroprusside -other ointments, creams, or sprays that may contain anesthetic medicine -phenobarbital -phenytoin -quinine -sulfonamides like sulfacetamide, sulfamethoxazole, sulfasalazine and others This list may not describe all possible interactions. Give your health care provider a list of all the medicines, herbs, non-prescription drugs, or dietary supplements you use. Also tell them if you smoke, drink alcohol, or use illegal drugs. Some items may interact with your medicine. What should I watch for while using this medicine? Be careful to avoid injury to the treated area while it is numb and you are not aware of pain. Avoid scratching, rubbing, or exposing the treated area to hot or cold temperatures until complete sensation has returned. The numb feeling will wear off a few hours after applying the cream. What side effects may I notice from receiving this medicine? Side effects that you should report to your doctor or health care professional as soon as possible: -blurred vision -chest pain -difficulty breathing -dizziness -drowsiness -fast or irregular heartbeat -skin rash or itching -swelling of your throat, lips, or face -trembling Side effects that usually do not require medical attention (report to your doctor or health care professional if they continue or are bothersome): -changes in ability to feel hot or cold -redness and swelling at the application site This list may not describe all possible side effects. Call your doctor for medical advice about side effects. You may report side effects to FDA at 1-800-FDA-1088. Where should I keep my medicine? Keep out of reach of children. Store at room temperature between 15 and 30 degrees  C (59 and 86 degrees F). Keep container tightly closed. Throw away any unused medicine after the expiration date. NOTE: This sheet is a summary. It may not cover all possible information. If you have questions about this medicine, talk to your doctor, pharmacist, or health care provider.  2015, [employer] Standard. (2007-08-01 17:14:35)  Implanted Port Insertion An implanted port is a central line that has a round shape and is placed under the skin. It is used as a long-term IV access for:   Medicines, such as chemotherapy.   Fluids.   Liquid nutrition, such as total parenteral nutrition (TPN).   Blood samples.  LET [facility] CARE PROVIDER KNOW ABOUT:  Allergies to food or medicine.   Medicines taken, including vitamins, herbs, eye drops, creams, and over-the-counter medicines.   Any allergies to heparin.  Use of steroids (by mouth or creams).   Previous problems with anesthetics or numbing medicines.   History of bleeding problems or blood clots.   Previous surgery.   Other health problems, including diabetes and kidney problems.   Possibility of pregnancy, if this applies. RISKS AND COMPLICATIONS Generally, this is a safe procedure. However, as with any procedure, problems can occur. Possible problems include:  Damage to the blood vessel, bruising, or bleeding at the puncture site.   Infection.  Blood clot in the vessel that the port is in.  Breakdown of the skin over your port.  Very rarely a person may develop a condition called a pneumothorax, a collection of air in the chest that may cause one of the lungs to collapse. The placement of these catheters with the appropriate imaging guidance significantly decreases the risk of a pneumothorax.  BEFORE THE PROCEDURE   Your health care provider may want you to have blood tests. These tests can help tell how well your kidneys and liver are working. They can also show how well your blood clots.   If  you take blood thinners (anticoagulant medicines), ask your health care provider when you should stop taking them.   Make arrangements for someone to drive you home. This is necessary if you have been sedated for your procedure.  PROCEDURE  Port insertion usually takes about 30-45 minutes.   An IV needle will be inserted in your arm. Medicine for pain and medicine to help relax you (sedative) will flow directly into your body through this needle.   You will lie on an exam table, and you will be connected to monitors to keep track of your heart rate, blood pressure, and breathing throughout the procedure.  An oxygen monitoring device may be attached to your finger. Oxygen will be given.   Everything will be kept as germ free (sterile) as possible during the procedure. The skin near the point of the incision will be cleansed with antiseptic, and the area will be draped with sterile towels. The skin and deeper tissues over the port area will be made numb with a local anesthetic.  Two small cuts (incisions) will be made in the skin to insert the port. One will be made in the neck to obtain access to the vein where the catheter will lie.   Because the port reservoir will be placed under the skin, a small skin incision will be made in the upper chest, and a small pocket for the port will be made under the skin. The catheter that will be connected to the port tunnels to a large central vein in the chest. A small, raised area will remain on your body at the site of the reservoir when the procedure is complete.  The port placement will be done under imaging guidance to ensure the proper placement.  The reservoir has a silicone covering that can be punctured with a special needle.   The port will be flushed with normal  saline, and blood will be drawn to make sure it is working properly.  There will be nothing remaining outside the skin when the procedure is finished.   Incisions will be held  together by stitches, surgical glue, or a special tape. AFTER THE PROCEDURE  You will stay in a recovery area until the anesthesia has worn off. Your blood pressure and pulse will be checked.  A final chest X-ray will be taken to check the placement of the port and to ensure that there is no injury to your lung. Document Released: 11/16/2012 Document Revised: 06/12/2013 Document Reviewed: 11/16/2012 [facility] Patient Information 2015 [place], [place]. This information is not intended to replace advice given to you by your health care provider. Make sure you discuss any questions you have with your health care provider. Bladder Cancer Bladder cancer is an abnormal growth of tissue in your bladder. Your bladder is the balloon-like sac in your pelvis. It collects and stores urine that comes from the kidneys through the ureters. The bladder wall is made of layers. If cancer spreads into these layers and through the wall of the bladder, it becomes more difficult to treat.  There are four stages of bladder cancer:  Stage I. Cancer at this stage occurs in the bladder's inner lining but has not invaded the muscular bladder wall.  Stage II. At this stage, cancer has invaded the bladder wall but is still confined to the bladder.  Stage III. By this stage, the cancer cells have spread through the bladder wall to surrounding tissue. They may also have spread to the prostate in men or the uterus or vagina in women.  Stage IV. By this stage, cancer cells may have spread to the lymph nodes and other organs, such as your lungs, bones, or liver. RISK FACTORS Although the cause of bladder cancer is not known, the following risk factors can increase your chances of getting bladder cancer:   Smoking.   Occupational exposures, such as rubber, leather, textile, dyes, chemicals, and paint.  Being white.  Age.   Being male.   Having chronic bladder inflammation.   Having a bladder cancer history.    Having a family history of bladder cancer (heredity).   Having had chemotherapy or radiation therapy to the pelvis.   Being exposed to arsenic.  SYMPTOMS   Blood in the urine.   Pain with urination.   Frequent bladder or urine infections.  Increase in urgency and frequency of urination. DIAGNOSIS  Your health care provider may suspect bladder cancer based on your description of urinary symptoms or based on the finding of blood or infection in the urine (especially if this has recurred several times). Other tests or procedures that may be performed include:   A narrow tube being inserted into your bladder through your urethra (cystoscopy) in order to view the lining of your bladder for tumors.   A biopsy to sample the tumor to see if cancer is present.  If cancer is present, it will then be staged to determine its severity and extent. It is important to know how deeply into the bladder wall the cancer has grown and whether the cancer has spread to any other parts of your body. Staging may require blood tests or special scans such as a CT scan, MRI, bone scan, or chest X-ray.  TREATMENT  Once your cancer has been diagnosed and staged, you should discuss a treatment plan with your health care provider. Based on the stage of the cancer,  one treatment or a combination of treatments may be recommended. The most common forms of treatment are:   Surgery. Procedures that may be done include transurethral resection and cystectomy.  Radiation therapy. This is infrequently used to treat bladder cancer.   Chemotherapy. During this treatment, drugs are used to kill cancer cells.  Immunotherapy. This is usually administered directly into the bladder. HOME CARE INSTRUCTIONS  Take medicines only as directed by your health care provider.   Maintain a healthy diet.   Consider joining a support group. This may help you learn to cope with the stress of having bladder cancer.    Seek advice to help you manage treatment side effects.   Keep all follow-up visits as directed by your health care provider.   Inform your cancer specialist if you are admitted to the hospital.  [place] IF:  There is blood in your urine.  You have symptoms of a urinary tract infection. These include:  Tiredness.  Shakiness.  Weakness.  Muscle aches.  Abdominal pain.  Frequent and intense urge to urinate (in young women).  Burning feeling in the bladder or urethra during urination (in young women). SEEK IMMEDIATE MEDICAL CARE IF:  You are unable to urinate. Document Released: 01/29/2003 Document Revised: 06/12/2013 Document Reviewed: 07/19/2012 [facility] Patient Information 2015 [place], [place]. This information is not intended to replace advice given to you by your health care provider. Make sure you discuss any questions you have with your health care provider. Chemotherapy Many people are apprehensive about chemotherapy due to concerns over uncomfortable side effects. However, managements for side effects have come a long way. Many side effects once associated with chemotherapy can be prevented and/or controlled. WHAT IS CHEMOTHERAPY? Chemotherapy is the general term for any treatment involving the use of chemical agents. Chemotherapy can be given through a vein, most commonly through an implanted port* or PICC line.* It can also be delivered by mouth (orally) in the form of a pill. The main goal of chemotherapy is to kill cancer cells and stop them from growing. It can destroy and eliminate cancer cells where the cancer started (primary tumor location) and throughout the body, often far away from the original cancer. It is a treatment that not only targets the original cancer location, but also the entire body (systemic treatment) for full effect and results. Chemotherapy works by destroying cancer cells. Unfortunately, it cannot tell the difference between a  cancer cell and some healthy cells. This results in the death of noncancerous cells, such as hair and blood cells. Harm to healthy cells is what causes side effects. These cells usually repair themselves after chemotherapy. Because some drugs work better together rather than alone, 2 or more drugs are often given at the same time. This is called combination chemotherapy. Depending on the type of cancer and how advanced it is, chemotherapy can be used for different goals:  Cure the cancer.  Keep the cancer from spreading.  Slow the cancer's growth.  Kill cancer cells that may have spread to other parts of the body from the original tumor.  Relieve symptoms caused by cancer. You and your caregiver will decide what drug or combination of drugs you will get. Your caregiver will choose the doses, how the drugs will be given, how often, and how long you will get treatment. All of these decisions will depend on the type of cancer, where it is, how big it is, and how it is affecting your normal body functions and  overall health. *Implanted port - A device that is implanted under your skin so that medicines may be delivered directly into your blood system. *PICC line (peripherally inserted central catheter) - A long, slender, flexible tube. This tube is often inserted into a vein, typically in the upper arm. The tip stops in the large central vein that leads to your heart. Document Released: 11/23/2006 Document Revised: 04/20/2011 Document Reviewed: 05/10/2008 [facility] Patient Information 2015 [place], [place]. This information is not intended to replace advice given to you by your health care provider. Make sure you discuss any questions you have with your health care provider.

## 2014-02-11 NOTE — Assessment & Plan Note (Signed)
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  After a discussion with Dr. [name] and the patient/patient's family we have opted for chemotherapy with Carboplatin/Gemzar. We had a long discussion of risks/benefits to treatment. He wishes to proceed. I believe he can tolerate treatment.  If he does well, disease improves, pelvic pain improves, we can revisit the addition of XRT for good loco-regional control.  We spent a good deal of time today discussing his social issues.  He is clearly depressed.  His wife states that he was improving during radiation because he was getting out everyday.  We discussed working with out Education officer, museum and referral to psychiatry and he is agreeable.

## 2014-02-11 NOTE — Assessment & Plan Note (Signed)
I spent time today discussing the rationale behind good pain control.  The limitations associated with short acting pain medications.  I addressed his concerns regarding "addiction."  I believe his lower abdominal pain/pelvic pain is malignancy related and good pain control will help improve his PS.  He states a reason for not going out with his family is poor pain control.  We will start him on duragesic 12 mcg/hr, he can use norco for breakthrough.  We will increase his fentanyl as needed. We will reassess his pain next week at his first chemotherapy visit.

## 2014-02-12 ENCOUNTER — Encounter (HOSPITAL_COMMUNITY): Payer: Medicare Other | Attending: Hematology & Oncology

## 2014-02-12 DIAGNOSIS — Z720 Tobacco use: Secondary | ICD-10-CM | POA: Insufficient documentation

## 2014-02-12 DIAGNOSIS — R319 Hematuria, unspecified: Secondary | ICD-10-CM | POA: Insufficient documentation

## 2014-02-12 DIAGNOSIS — C679 Malignant neoplasm of bladder, unspecified: Secondary | ICD-10-CM | POA: Diagnosis not present

## 2014-02-12 MED ORDER — MORPHINE SULFATE ER 15 MG PO TBCR: 15.0000 mg | EXTENDED_RELEASE_TABLET | Freq: Two times a day (BID) | ORAL | Status: DC

## 2014-02-13 ENCOUNTER — Telehealth (HOSPITAL_COMMUNITY): Payer: Self-pay | Admitting: Hematology & Oncology

## 2014-02-13 ENCOUNTER — Encounter (HOSPITAL_COMMUNITY): Payer: Self-pay | Admitting: Lab

## 2014-02-13 ENCOUNTER — Other Ambulatory Visit (HOSPITAL_COMMUNITY): Payer: Medicare Other

## 2014-02-13 ENCOUNTER — Ambulatory Visit (HOSPITAL_COMMUNITY): Payer: Medicare Other | Admitting: Hematology & Oncology

## 2014-02-13 NOTE — Progress Notes (Signed)
Chemo teaching done and consent signed for Carboplatin & Gemzar. Distress screening not done. Chemo teaching and other questions took 2 hours and 40 minutes. I will do distress screening on Wed 02/14/14. Dr. Whitney Muse provided a script for MS ER 15mg  q12h. Patient to stop with Fentanyl patches. Patient reports inability to sleep while using Fentanyl patches. It was explained to patient that he needed to take the MS ER as prescribed and to not delete the night time dose. Patient has a "upper" response to narcotics in which it makes it difficult for him to sleep. I told him that it would not be wise or best for him to only take the morning dose of the MS ER. He said ok. I asked him to please try taking the MS ER q12h as directed by Dr. Whitney Muse for at least a week to give it a try. We also had to discuss him taking his sleeping medication Trazodone. He has been cutting this pill into 4 pieces and taking 1 piece at a time throught the night hours. Patient gets up approx 15 times a night to void and this makes it hard for him to go back to sleep. His nightly practice has been to take 1/4 of the Trazodone tablet to equal up to 1 tablet of Trazodone at various times of the night to aid in sleeping. I told him that since this pill was not scored - that this practice was probably yielding ineffective results because all the medication in the pill could be in 1/4 of the tablet and not in the other 3/4 of the tablet. He did not know that prior. We discussed the importance of him trying a whole tablet of Trazodone for at least one week and to be truthful with [place] when asked if he was taking a whole pill @ one time. I told him that if the sleeping pill wasn't working that we needed to change it to something that would work. He and his wife verbalized understanding. I reiterated this several times that he really needed to take the pain medication and sleeping pill as ordered. We then moved on to discuss the Xanax. When he takes his  Xanax he does not take a whole pill @ a time. He take 1/2 tablet. He is prescribed a 1mg  tablet and it is scored. He cuts it in half. His order is for him to take 1 tablet q4h. He takes 1/2 tablet q2h. Most of his Xanax taking is done during the night hours because of his not sleeping well. So what he and I discussed along with his wife was to try taking a Xanax @ 4pm, His MS ER at 6pm and to take a Trazodone @ 9 or 10pm at night. I then told him to his Xanax @ 12 or 2 or whenever it was he got up (if it was hard to go back to sleep). I told him that we didn't want him to stop breathing from taking all of these medications. He verbalized understanding. His wife and son were making notes as I was speaking. I hope he will be compliant with these instructions so that we can accurately assess what meds are working and which ones aren't. He returns on Wednesday 02/14/14 for chemo. He will need a bed. He has a hard time getting comfortable. Dr. Luan Pulling prescribes his Trazodone and Xanax. Patient has been on Trazodone for 10 years and has never taken a whole tablet. We discussed getting him on a bowel  regimen that kept his bowels moving but not loose (watery). I will not go into detail with that here. His wife and son also made notes about that. I will sit down with patient/family again on Wednesday to review chemo side effects and again, when to call, etc. Patient had other questions such as with his recent diagnosis and tx with XRT for his Stage I NSCLC. He wanted to know what his odds were of it coming back. According to his wife, Dr. [name] said it was a 90% chance that it would not come back but we wouldn't know for sure until we were able to do more scans and that that wouldn't be done for a while d/t the inflammation that needs to subside from the XRT. Wife asked if this chemo was palliative only because it sounded like we were taking a curative approach. I told her that this chemo was palliative and that we were  going to have to see how he did with chemo before we could proceed with XRT to try and relieve some of the pain in the pelvic area. I still don't know that she completely grasped the concept that this was palliative measures. I did explain to her that we were trying to control the disease, hopefully give him a better quality of life, and decrease the pain. She verbalized understanding of this.

## 2014-02-13 NOTE — Progress Notes (Signed)
Referral made to [facility] on 1/5. Records faxed and their office to call with appts.

## 2014-02-13 NOTE — Telephone Encounter (Signed)
Called pt to notify him of Zofran approval. OK to pick up meds

## 2014-02-13 NOTE — Telephone Encounter (Signed)
SUBMITTED PRE AUTH REQUEST TO [email or web address] 3 DAY TURN AROUND TIME

## 2014-02-14 ENCOUNTER — Encounter (HOSPITAL_BASED_OUTPATIENT_CLINIC_OR_DEPARTMENT_OTHER): Payer: Medicare Other

## 2014-02-14 ENCOUNTER — Encounter (HOSPITAL_COMMUNITY): Payer: Self-pay

## 2014-02-14 ENCOUNTER — Encounter: Payer: Self-pay | Admitting: *Deleted

## 2014-02-14 DIAGNOSIS — Z720 Tobacco use: Secondary | ICD-10-CM | POA: Diagnosis not present

## 2014-02-14 DIAGNOSIS — Z5111 Encounter for antineoplastic chemotherapy: Secondary | ICD-10-CM | POA: Diagnosis not present

## 2014-02-14 DIAGNOSIS — C679 Malignant neoplasm of bladder, unspecified: Secondary | ICD-10-CM

## 2014-02-14 DIAGNOSIS — R319 Hematuria, unspecified: Secondary | ICD-10-CM | POA: Diagnosis not present

## 2014-02-14 LAB — COMPREHENSIVE METABOLIC PANEL
ALK PHOS: 72 U/L (ref 39–117)
ALT: 11 U/L (ref 0–53)
AST: 17 U/L (ref 0–37)
Albumin: 4 g/dL (ref 3.5–5.2)
Anion gap: 4 — ABNORMAL LOW (ref 5–15)
BILIRUBIN TOTAL: 0.3 mg/dL (ref 0.3–1.2)
BUN: 12 mg/dL (ref 6–23)
CHLORIDE: 104 meq/L (ref 96–112)
CO2: 31 mmol/L (ref 19–32)
Calcium: 9 mg/dL (ref 8.4–10.5)
Creatinine, Ser: 0.91 mg/dL (ref 0.50–1.35)
GFR calc Af Amer: >90 mL/min (ref >90)
GFR, EST NON AFRICAN AMERICAN: 90 mL/min — AB (ref >90)
GLUCOSE: 94 mg/dL (ref 70–99)
Potassium: 3.7 mmol/L (ref 3.5–5.1)
Sodium: 139 mmol/L (ref 135–145)
Total Protein: 6.5 g/dL (ref 6.0–8.3)

## 2014-02-14 LAB — CBC WITH DIFFERENTIAL/PLATELET
BASOS PCT: 1 % (ref 0–1)
Basophils Absolute: 0 10*3/uL (ref 0.0–0.1)
EOS PCT: 4 % (ref 0–5)
Eosinophils Absolute: 0.2 10*3/uL (ref 0.0–0.7)
HEMATOCRIT: 44.6 % (ref 39.0–52.0)
Hemoglobin: 14.7 g/dL (ref 13.0–17.0)
Lymphocytes Relative: 23 % (ref 12–46)
Lymphs Abs: 1.4 10*3/uL (ref 0.7–4.0)
MCH: 31.3 pg (ref 26.0–34.0)
MCHC: 33 g/dL (ref 30.0–36.0)
MCV: 94.9 fL (ref 78.0–100.0)
Monocytes Absolute: 0.8 10*3/uL (ref 0.1–1.0)
Monocytes Relative: 13 % — ABNORMAL HIGH (ref 3–12)
Neutro Abs: 3.6 10*3/uL (ref 1.7–7.7)
Neutrophils Relative %: 59 % (ref 43–77)
Platelets: 197 10*3/uL (ref 150–400)
RBC: 4.7 MIL/uL (ref 4.22–5.81)
RDW: 13.7 % (ref 11.5–15.5)
WBC: 6.1 10*3/uL (ref 4.0–10.5)

## 2014-02-14 MED ORDER — DEXAMETHASONE SODIUM PHOSPHATE 10 MG/ML IJ SOLN: 20.0000 mg | Freq: Once | INTRAMUSCULAR | Status: DC

## 2014-02-14 MED ORDER — SODIUM CHLORIDE 0.9 % IV SOLN
Freq: Once | INTRAVENOUS | Status: AC
  Administered 2014-02-14: 12:00:00 via INTRAVENOUS

## 2014-02-14 MED ORDER — HEPARIN SOD (PORK) LOCK FLUSH 100 UNIT/ML IV SOLN: 500.0000 [IU] | Freq: Once | INTRAVENOUS | Status: DC | PRN

## 2014-02-14 MED ORDER — SODIUM CHLORIDE 0.9 % IV SOLN
Freq: Once | INTRAVENOUS | Status: AC
  Administered 2014-02-14: 16 mg via INTRAVENOUS
  Filled 2014-02-14: qty 8

## 2014-02-14 MED ORDER — SODIUM CHLORIDE 0.9 % IJ SOLN: 10.0000 mL | INTRAMUSCULAR | Status: DC | PRN

## 2014-02-14 MED ORDER — SODIUM CHLORIDE 0.9 % IV SOLN
240.0000 mg | Freq: Once | INTRAVENOUS | Status: AC
  Administered 2014-02-14: 240 mg via INTRAVENOUS
  Filled 2014-02-14: qty 24

## 2014-02-14 MED ORDER — SODIUM CHLORIDE 0.9 % IV SOLN
800.0000 mg/m2 | Freq: Once | INTRAVENOUS | Status: AC
  Administered 2014-02-14: 1330 mg via INTRAVENOUS
  Filled 2014-02-14: qty 34.98

## 2014-02-14 MED ORDER — SODIUM CHLORIDE 0.9 % IV SOLN: 16.0000 mg | Freq: Once | INTRAVENOUS | Status: DC

## 2014-02-14 NOTE — Progress Notes (Signed)
[place] Clinical Social Work  Clinical Social Work was referred by patient navigator for assessment of psychosocial needs due to possible depression.  Clinical Social Worker met with patient, wife and son at [facility] to offer support and assess for needs.  CSW could only meet briefly, as pt and family were receiving teaching re. Chemo. CSW reviewed role of CSW and resources to assist. CSW to follow up at appt on 02/21/14 and further discuss emotional concerns.     Clinical Social Work interventions: CSW role and resource education   Loren Racer, [place] Tuesdays 8:30-1pm Wednesdays 8:30-12pm  [phone]

## 2014-02-14 NOTE — Addendum Note (Signed)
Addended by: Gerhard Perches on: 02/14/2014 03:48 PM   Modules accepted: Orders

## 2014-02-14 NOTE — Progress Notes (Signed)
1415:  Tolerated treatment w/o adverse reaction.  A&Ox4;  VSS.  Discharged via wheelchair in c/o wife and son.

## 2014-02-14 NOTE — Patient Instructions (Signed)
[facility] Discharge Instructions for Patients Receiving Chemotherapy  Today you received the following chemotherapy agents:  Gemzar and carboplatin If you have nausea, vomiting, and/or diarrhea uncontrolled by your medications, please contact us. Please call us with any questions or concerns.  The clinic phone number is [phone]. Office hours are Monday-Friday 8:30am-5:00pm.  BELOW ARE SYMPTOMS THAT SHOULD BE REPORTED IMMEDIATELY:  *FEVER GREATER THAN 101.0 F  *CHILLS WITH OR WITHOUT FEVER  NAUSEA AND VOMITING THAT IS NOT CONTROLLED WITH YOUR NAUSEA MEDICATION  *UNUSUAL SHORTNESS OF BREATH  *UNUSUAL BRUISING OR BLEEDING  TENDERNESS IN MOUTH AND THROAT WITH OR WITHOUT PRESENCE OF ULCERS  *URINARY PROBLEMS  *BOWEL PROBLEMS  UNUSUAL RASH Items with * indicate a potential emergency and should be followed up as soon as possible. If you have an emergency after office hours please contact your primary care physician or go to the nearest emergency department.  Please call the clinic during office hours if you have any questions or concerns.   You may also contact the Patient Navigator at [phone] should you have any questions or need assistance in obtaining follow up care. _____________________________________________________________________ Have you asked about our STAR program?    STAR stands for Survivorship Training and Rehabilitation, and this is a nationally recognized cancer care program that focuses on survivorship and rehabilitation.  Cancer and cancer treatments may cause problems, such as, pain, making you feel tired and keeping you from doing the things that you need or want to do. Cancer rehabilitation can help. Our goal is to reduce these troubling effects and help you have the best quality of life possible.  You may receive a survey from a nurse that asks questions about your current state of health.  Based on the survey results, all eligible  patients will be referred to the [facility] program for an evaluation so we can better serve you! A frequently asked questions sheet is available upon request.          Gemcitabine injection What is this medicine? GEMCITABINE (jem SIT a been) is a chemotherapy drug. This medicine is used to treat many types of cancer like breast cancer, lung cancer, pancreatic cancer, and ovarian cancer. This medicine may be used for other purposes; ask your health care provider or pharmacist if you have questions. COMMON BRAND NAME(S): Gemzar What should I tell my health care provider before I take this medicine? They need to know if you have any of these conditions: -blood disorders -infection -kidney disease -liver disease -recent or ongoing radiation therapy -an unusual or allergic reaction to gemcitabine, other chemotherapy, other medicines, foods, dyes, or preservatives -pregnant or trying to get pregnant -breast-feeding How should I use this medicine? This drug is given as an infusion into a vein. It is administered in a hospital or clinic by a specially trained health care professional. Talk to your pediatrician regarding the use of this medicine in children. Special care may be needed. Overdosage: If you think you have taken too much of this medicine contact a poison control center or emergency room at once. NOTE: This medicine is only for you. Do not share this medicine with others. What if I miss a dose? It is important not to miss your dose. Call your doctor or health care professional if you are unable to keep an appointment. What may interact with this medicine? -medicines to increase blood counts like filgrastim, pegfilgrastim, sargramostim -some other chemotherapy drugs like cisplatin -vaccines Talk to your doctor or  health care professional before taking any of these medicines: -acetaminophen -aspirin -ibuprofen -ketoprofen -naproxen This list may not describe all possible  interactions. Give your health care provider a list of all the medicines, herbs, non-prescription drugs, or dietary supplements you use. Also tell them if you smoke, drink alcohol, or use illegal drugs. Some items may interact with your medicine. What should I watch for while using this medicine? Visit your doctor for checks on your progress. This drug may make you feel generally unwell. This is not uncommon, as chemotherapy can affect healthy cells as well as cancer cells. Report any side effects. Continue your course of treatment even though you feel ill unless your doctor tells you to stop. In some cases, you may be given additional medicines to help with side effects. Follow all directions for their use. Call your doctor or health care professional for advice if you get a fever, chills or sore throat, or other symptoms of a cold or flu. Do not treat yourself. This drug decreases your body's ability to fight infections. Try to avoid being around people who are sick. This medicine may increase your risk to bruise or bleed. Call your doctor or health care professional if you notice any unusual bleeding. Be careful brushing and flossing your teeth or using a toothpick because you may get an infection or bleed more easily. If you have any dental work done, tell your dentist you are receiving this medicine. Avoid taking products that contain aspirin, acetaminophen, ibuprofen, naproxen, or ketoprofen unless instructed by your doctor. These medicines may hide a fever. Women should inform their doctor if they wish to become pregnant or think they might be pregnant. There is a potential for serious side effects to an unborn child. Talk to your health care professional or pharmacist for more information. Do not breast-feed an infant while taking this medicine. What side effects may I notice from receiving this medicine? Side effects that you should report to your doctor or health care professional as soon as  possible: -allergic reactions like skin rash, itching or hives, swelling of the face, lips, or tongue -low blood counts - this medicine may decrease the number of white blood cells, red blood cells and platelets. You may be at increased risk for infections and bleeding. -signs of infection - fever or chills, cough, sore throat, pain or difficulty passing urine -signs of decreased platelets or bleeding - bruising, pinpoint red spots on the skin, black, tarry stools, blood in the urine -signs of decreased red blood cells - unusually weak or tired, fainting spells, lightheadedness -breathing problems -chest pain -mouth sores -nausea and vomiting -pain, swelling, redness at site where injected -pain, tingling, numbness in the hands or feet -stomach pain -swelling of ankles, feet, hands -unusual bleeding Side effects that usually do not require medical attention (report to your doctor or health care professional if they continue or are bothersome): -constipation -diarrhea -hair loss -loss of appetite -stomach upset This list may not describe all possible side effects. Call your doctor for medical advice about side effects. You may report side effects to FDA at 1-800-FDA-1088. Where should I keep my medicine? This drug is given in a hospital or clinic and will not be stored at home. NOTE: This sheet is a summary. It may not cover all possible information. If you have questions about this medicine, talk to your doctor, pharmacist, or health care provider.  2015, [employer] Standard. (2007-06-07 18:45:54) Carboplatin injection What is this medicine? CARBOPLATIN (KAR boe  pla tin) is a chemotherapy drug. It targets fast dividing cells, like cancer cells, and causes these cells to die. This medicine is used to treat ovarian cancer and many other cancers. This medicine may be used for other purposes; ask your health care provider or pharmacist if you have questions. COMMON BRAND NAME(S):  Paraplatin What should I tell my health care provider before I take this medicine? They need to know if you have any of these conditions: -blood disorders -hearing problems -kidney disease -recent or ongoing radiation therapy -an unusual or allergic reaction to carboplatin, cisplatin, other chemotherapy, other medicines, foods, dyes, or preservatives -pregnant or trying to get pregnant -breast-feeding How should I use this medicine? This drug is usually given as an infusion into a vein. It is administered in a hospital or clinic by a specially trained health care professional. Talk to your pediatrician regarding the use of this medicine in children. Special care may be needed. Overdosage: If you think you have taken too much of this medicine contact a poison control center or emergency room at once. NOTE: This medicine is only for you. Do not share this medicine with others. What if I miss a dose? It is important not to miss a dose. Call your doctor or health care professional if you are unable to keep an appointment. What may interact with this medicine? -medicines for seizures -medicines to increase blood counts like filgrastim, pegfilgrastim, sargramostim -some antibiotics like amikacin, gentamicin, neomycin, streptomycin, tobramycin -vaccines Talk to your doctor or health care professional before taking any of these medicines: -acetaminophen -aspirin -ibuprofen -ketoprofen -naproxen This list may not describe all possible interactions. Give your health care provider a list of all the medicines, herbs, non-prescription drugs, or dietary supplements you use. Also tell them if you smoke, drink alcohol, or use illegal drugs. Some items may interact with your medicine. What should I watch for while using this medicine? Your condition will be monitored carefully while you are receiving this medicine. You will need important blood work done while you are taking this medicine. This drug  may make you feel generally unwell. This is not uncommon, as chemotherapy can affect healthy cells as well as cancer cells. Report any side effects. Continue your course of treatment even though you feel ill unless your doctor tells you to stop. In some cases, you may be given additional medicines to help with side effects. Follow all directions for their use. Call your doctor or health care professional for advice if you get a fever, chills or sore throat, or other symptoms of a cold or flu. Do not treat yourself. This drug decreases your body's ability to fight infections. Try to avoid being around people who are sick. This medicine may increase your risk to bruise or bleed. Call your doctor or health care professional if you notice any unusual bleeding. Be careful brushing and flossing your teeth or using a toothpick because you may get an infection or bleed more easily. If you have any dental work done, tell your dentist you are receiving this medicine. Avoid taking products that contain aspirin, acetaminophen, ibuprofen, naproxen, or ketoprofen unless instructed by your doctor. These medicines may hide a fever. Do not become pregnant while taking this medicine. Women should inform their doctor if they wish to become pregnant or think they might be pregnant. There is a potential for serious side effects to an unborn child. Talk to your health care professional or pharmacist for more information. Do not breast-feed an  infant while taking this medicine. What side effects may I notice from receiving this medicine? Side effects that you should report to your doctor or health care professional as soon as possible: -allergic reactions like skin rash, itching or hives, swelling of the face, lips, or tongue -signs of infection - fever or chills, cough, sore throat, pain or difficulty passing urine -signs of decreased platelets or bleeding - bruising, pinpoint red spots on the skin, black, tarry stools,  nosebleeds -signs of decreased red blood cells - unusually weak or tired, fainting spells, lightheadedness -breathing problems -changes in hearing -changes in vision -chest pain -high blood pressure -low blood counts - This drug may decrease the number of white blood cells, red blood cells and platelets. You may be at increased risk for infections and bleeding. -nausea and vomiting -pain, swelling, redness or irritation at the injection site -pain, tingling, numbness in the hands or feet -problems with balance, talking, walking -trouble passing urine or change in the amount of urine Side effects that usually do not require medical attention (report to your doctor or health care professional if they continue or are bothersome): -hair loss -loss of appetite -metallic taste in the mouth or changes in taste This list may not describe all possible side effects. Call your doctor for medical advice about side effects. You may report side effects to FDA at 1-800-FDA-1088. Where should I keep my medicine? This drug is given in a hospital or clinic and will not be stored at home. NOTE: This sheet is a summary. It may not cover all possible information. If you have questions about this medicine, talk to your doctor, pharmacist, or health care provider.  2015, [employer]vier/[employer] Standard. (2007-05-03 14:38:05)

## 2014-02-14 NOTE — Progress Notes (Signed)
What to know after Chemo paper reviewed and given to patient's wife.

## 2014-02-15 ENCOUNTER — Encounter (HOSPITAL_COMMUNITY): Payer: Self-pay | Admitting: Emergency Medicine

## 2014-02-15 NOTE — Progress Notes (Signed)
Pt states that has done well since chemotherapy administration.  Felt a little weak last night but feels better today.  No N/V/D.  His arm that had the IV in it was a little achy but is improving.

## 2014-02-16 ENCOUNTER — Telehealth: Payer: Self-pay | Admitting: Nutrition

## 2014-02-16 NOTE — Telephone Encounter (Signed)
Received nutrition consult to contact patient and review calories and protein needs. Patient was not available by phone.  However, I left my name and phone number for him to return my call.

## 2014-02-20 ENCOUNTER — Telehealth: Payer: Self-pay | Admitting: Nutrition

## 2014-02-20 NOTE — Telephone Encounter (Signed)
Nutrition referral received from Lydia.  Patient is a 64 year old man with bladder invasion to the prostate receiving chemotherapy.  Past medical history includes radiation therapy to lung for stage I non-small cell lung cancer, COPD, IBD, constipation, and anxiety.  Medications include Remeron, Xanax, Colace, multivitamin, Prilosec, Zofran, Compazine, and vitamin C.  Labs were reviewed.  Height: 5 feet 6-1/2 inches. Weight: 127 pounds. Usual body weight: 155 pounds per wife several years ago. BMI: 20.19.  Spoke with patient's wife who reports patient has basically been in bed since 2011.  Patient has not had significant weight changes lately. Patient has difficulty chewing and has had a decreased appetite over the past year. Wife endorses weight loss over time. Patient has constipation after chemotherapy. Dietary recall reveals patient's wife preparers a variety of soft protein foods for patient.   She has recently purchased boost high protein, and boost plus. Wife reports patient's complaining of increased pain.  Estimated nutrition needs: 1920-2150 cal, 82-95 grams protein, 2.2 L fluid.  Nutrition diagnosis: Unintended weight loss related to poor appetite and inadequate oral intake as evidenced by 18% weight loss from usual body weight.  Intervention: Educated patient's wife on appropriate high-calorie high-protein foods. Encouraged soft textures for ease in chewing and swallowing. Recommended patient consume boost plus twice a day to 3 times a day between meals. Encouraged wife to offer patient snacks every 2-3 hours while patient is awake. Will mail fact sheets on increasing calories and protein, making the most of each bite, and easy to chew and swallow as well as coupons to purchase oral supplements. Questions were answered and teach back method used.  Monitoring, evaluation, goals: Patient will work to improve oral intake to minimize weight loss.  Next  visit: Patient/wife can contact me as needed.  Contact information was provided.  **Disclaimer: This note was dictated with voice recognition software. Similar sounding words can inadvertently be transcribed and this note may contain transcription errors which may not have been corrected upon publication of note.**

## 2014-02-21 ENCOUNTER — Encounter (HOSPITAL_BASED_OUTPATIENT_CLINIC_OR_DEPARTMENT_OTHER): Payer: Medicare Other

## 2014-02-21 ENCOUNTER — Encounter (HOSPITAL_BASED_OUTPATIENT_CLINIC_OR_DEPARTMENT_OTHER): Payer: Medicare Other | Admitting: Hematology & Oncology

## 2014-02-21 ENCOUNTER — Encounter: Payer: Self-pay | Admitting: *Deleted

## 2014-02-21 ENCOUNTER — Encounter (HOSPITAL_COMMUNITY): Payer: Self-pay | Admitting: Hematology & Oncology

## 2014-02-21 ENCOUNTER — Inpatient Hospital Stay (HOSPITAL_COMMUNITY): Payer: Medicare Other

## 2014-02-21 VITALS — BP 127/69 | HR 66 | Temp 98.1°F | Resp 18 | Wt 127.0 lb

## 2014-02-21 DIAGNOSIS — C679 Malignant neoplasm of bladder, unspecified: Secondary | ICD-10-CM

## 2014-02-21 DIAGNOSIS — Z5111 Encounter for antineoplastic chemotherapy: Secondary | ICD-10-CM | POA: Diagnosis not present

## 2014-02-21 DIAGNOSIS — Z85118 Personal history of other malignant neoplasm of bronchus and lung: Secondary | ICD-10-CM

## 2014-02-21 DIAGNOSIS — G893 Neoplasm related pain (acute) (chronic): Secondary | ICD-10-CM

## 2014-02-21 DIAGNOSIS — Z720 Tobacco use: Secondary | ICD-10-CM

## 2014-02-21 LAB — COMPREHENSIVE METABOLIC PANEL
ALBUMIN: 4.2 g/dL (ref 3.5–5.2)
ALT: 14 U/L (ref 0–53)
AST: 17 U/L (ref 0–37)
Alkaline Phosphatase: 70 U/L (ref 39–117)
Anion gap: 5 (ref 5–15)
BILIRUBIN TOTAL: 0.5 mg/dL (ref 0.3–1.2)
BUN: 18 mg/dL (ref 6–23)
CALCIUM: 9.3 mg/dL (ref 8.4–10.5)
CO2: 29 mmol/L (ref 19–32)
Chloride: 104 mEq/L (ref 96–112)
Creatinine, Ser: 0.94 mg/dL (ref 0.50–1.35)
GFR calc Af Amer: >90 mL/min (ref >90)
GFR calc non Af Amer: 88 mL/min — ABNORMAL LOW (ref >90)
Glucose, Bld: 116 mg/dL — ABNORMAL HIGH (ref 70–99)
POTASSIUM: 4.5 mmol/L (ref 3.5–5.1)
SODIUM: 138 mmol/L (ref 135–145)
TOTAL PROTEIN: 6.9 g/dL (ref 6.0–8.3)

## 2014-02-21 LAB — CBC WITH DIFFERENTIAL/PLATELET
Basophils Absolute: 0 10*3/uL (ref 0.0–0.1)
Basophils Relative: 1 % (ref 0–1)
EOS ABS: 0 10*3/uL (ref 0.0–0.7)
EOS PCT: 1 % (ref 0–5)
HEMATOCRIT: 41.3 % (ref 39.0–52.0)
HEMOGLOBIN: 13.7 g/dL (ref 13.0–17.0)
Lymphocytes Relative: 43 % (ref 12–46)
Lymphs Abs: 1 10*3/uL (ref 0.7–4.0)
MCH: 31.6 pg (ref 26.0–34.0)
MCHC: 33.2 g/dL (ref 30.0–36.0)
MCV: 95.2 fL (ref 78.0–100.0)
MONOS PCT: 1 % — AB (ref 3–12)
Monocytes Absolute: 0 10*3/uL — ABNORMAL LOW (ref 0.1–1.0)
Neutro Abs: 1.3 10*3/uL — ABNORMAL LOW (ref 1.7–7.7)
Neutrophils Relative %: 54 % (ref 43–77)
Platelets: 120 10*3/uL — ABNORMAL LOW (ref 150–400)
RBC: 4.34 MIL/uL (ref 4.22–5.81)
RDW: 13.2 % (ref 11.5–15.5)
WBC: 2.3 10*3/uL — ABNORMAL LOW (ref 4.0–10.5)

## 2014-02-21 MED ORDER — SODIUM CHLORIDE 0.9 % IV SOLN
240.0000 mg | Freq: Once | INTRAVENOUS | Status: AC
  Administered 2014-02-21: 240 mg via INTRAVENOUS
  Filled 2014-02-21: qty 24

## 2014-02-21 MED ORDER — SODIUM CHLORIDE 0.9 % IJ SOLN
10.0000 mL | INTRAMUSCULAR | Status: DC | PRN
  Administered 2014-02-21: 10 mL
  Filled 2014-02-21: qty 10

## 2014-02-21 MED ORDER — PROCHLORPERAZINE MALEATE 10 MG PO TABS
10.0000 mg | ORAL_TABLET | Freq: Once | ORAL | Status: AC
  Administered 2014-02-21: 10 mg via ORAL
  Filled 2014-02-21: qty 1

## 2014-02-21 MED ORDER — SODIUM CHLORIDE 0.9 % IV SOLN
Freq: Once | INTRAVENOUS | Status: AC
  Administered 2014-02-21: 14:00:00 via INTRAVENOUS

## 2014-02-21 MED ORDER — GEMCITABINE HCL CHEMO INJECTION 1 GM/26.3ML
800.0000 mg/m2 | Freq: Once | INTRAVENOUS | Status: AC
  Administered 2014-02-21: 1330 mg via INTRAVENOUS
  Filled 2014-02-21: qty 34.98

## 2014-02-21 NOTE — Patient Instructions (Signed)
[facility] Discharge Instructions for Patients Receiving Chemotherapy  Today you received the following chemotherapy agents Carbo and Gemzar. To help prevent nausea and vomiting after your treatment, we encourage you to take your nausea medication as instructed.  If you develop nausea and vomiting that is not controlled by your nausea medication, call the clinic. If it is after clinic hours your family physician or the after hours number for the clinic or go to the Emergency Department. BELOW ARE SYMPTOMS THAT SHOULD BE REPORTED IMMEDIATELY:  *FEVER GREATER THAN 101.0 F  *CHILLS WITH OR WITHOUT FEVER  NAUSEA AND VOMITING THAT IS NOT CONTROLLED WITH YOUR NAUSEA MEDICATION  *UNUSUAL SHORTNESS OF BREATH  *UNUSUAL BRUISING OR BLEEDING  TENDERNESS IN MOUTH AND THROAT WITH OR WITHOUT PRESENCE OF ULCERS  *URINARY PROBLEMS  *BOWEL PROBLEMS  UNUSUAL RASH Items with * indicate a potential emergency and should be followed up as soon as possible.  Return as scheduled.  I have been informed and understand all the instructions given to me. I know to contact the clinic, my physician, or go to the Emergency Department if any problems should occur. I do not have any questions at this time, but understand that I may call the clinic during office hours or the Patient Navigator at [phone] should I have any questions or need assistance in obtaining follow up care.    __________________________________________  _____________  __________ Signature of Patient or Authorized Representative            Date                   Time    __________________________________________ Nurse's Signature

## 2014-02-21 NOTE — Progress Notes (Signed)
[facility] Psychosocial Distress Screening Clinical Social Work  Clinical Social Work was referred by distress screening protocol.  The patient scored a 10 on the Psychosocial Distress Thermometer which indicates severe distress. Clinical Social Worker phoned pt and met with him and his wife at [facility] to assess for distress and other psychosocial needs. Pain and depression continue to be an issue for him. He feels his depression medication is not really helping even after being increased last fall. The medical team has been trying to address his pain control, but nothing has been helping him per his report. CSW discussed other behavioral and coping options that may assist in decreasing his symptoms of depression. CSW encouraged pt and wife to attend support programs at [facility] as well. Pt has a cancer insurance policy that he has many questions about. He plans to bring this to the [facility] and have would like guidance in how to proceed in using this policy. CSW and financial advocate to assist. Pt and wife aware of how to reach out as needed. CSW to follow.   ONCBCN DISTRESS SCREENING 02/14/2014  Screening Type Initial Screening  Distress experienced in past week (1-10) 10  Practical problem type Insurance  Emotional problem type Depression;Nervousness/Anxiety;Adjusting to illness  Physical Problem type Pain;Sleep/insomnia;Constipation/diarrhea  Physician notified of physical symptoms Yes  Referral to clinical psychology Yes  Referral to clinical social work Yes  Referral to dietition Yes  Referral to financial advocate Yes    Clinical Social Worker follow up needed: Yes.    If yes, follow up plan: See above Loren Racer, [place] Tuesdays 8:30-1pm Wednesdays 8:30-12pm  [phone]

## 2014-02-21 NOTE — Progress Notes (Signed)
LABS FOR CBCD,CMP

## 2014-02-21 NOTE — Progress Notes (Signed)
Tolerated chemo infusion without complaints.

## 2014-02-21 NOTE — Progress Notes (Signed)
1230:  Pt w/multiple complaints s/p initial chemo tx on 02/14/14.  Reports left arm "very sore" to posterior forearm after receiving chemotherapy through peripheral IV - no evidence of injury or swelling to lt forearm. Pt now considering having port-a-cath placed. Reports intermittent episodes of constipation this week, all of which were relieved by taking senekot and/or MOM.  C/o decreased appetite (50% normal). Reports he continues to take his Percocet 10/325 0.5 tablet q2h - states this makes the pain "bearable". States the percocet controls his pain if he takes 1.5 - 2 tablets every 4 hours. Pt will take 1.5 - 2 tablets if he has to leave the house "so I can get up and move around". Will not take his morphine or fentanyl, stating it keeps him up at night.  1300: Dr. Whitney Muse notified of [place] 1300.  Okay to treat per MD.

## 2014-02-22 ENCOUNTER — Telehealth (HOSPITAL_COMMUNITY): Payer: Self-pay | Admitting: *Deleted

## 2014-02-22 ENCOUNTER — Ambulatory Visit (HOSPITAL_COMMUNITY): Payer: Medicare Other

## 2014-02-22 ENCOUNTER — Other Ambulatory Visit (HOSPITAL_COMMUNITY): Payer: Self-pay | Admitting: Oncology

## 2014-02-22 NOTE — Telephone Encounter (Signed)
I called pt to check on him today and he is doing ok. He is complaining of the chemotherapy that infused last week through his IV. Yesterday's IV didn't bother him at all and it is not hurting. He is having pain/tenderness  north of his IV site from 1 week ago (left arm) when he mashes on that site. He states that the forearm muscle is hard "not rock hard but a little hard", skin around that area is warm to touch, noticeably swollen, & pinkish red in color. Pain in that site has improved since last week.

## 2014-02-25 NOTE — Assessment & Plan Note (Signed)
He continues to smoke. I discussed options to help him stop smoking. He currently is unwilling to quit.

## 2014-02-25 NOTE — Assessment & Plan Note (Signed)
I again spent time reviewing our options for treatment of his pain. If he takes 2 tablets of his short acting pain medication, he states his pain goes away. The only time he does this is when he has a doctor's appointment. He says he cannot take pain medication most of the time because it will keep him from sleeping. I have tried him on long-acting pain medications as well. We will continue to work with him moving forward on these issues.

## 2014-02-25 NOTE — Assessment & Plan Note (Signed)
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 He did quite well with his radiation therapy. He is due today for cycle 1 day 8 of carboplatin and Gemzar. He has multiple complaints most of which are chronic. He did really well from a chemotherapy perspective.  I have advised the patient and his wife would like them to continue to work with Lavella Hammock. I feel many of Brian Sims's major issues are depression related. I discussed with him taking his trazodone as prescribed. I advised him he may get multiple benefits not just better sleep, better mood, but also perhaps some improvement in his pain. I will see him back again prior to cycle 2 day 1. We will arrange for "Port-A-Cath placement in radiology.

## 2014-02-25 NOTE — Progress Notes (Signed)
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 male returns for his bladder cancer.  He has multiple nonspecific complaints pain continues to be a problem but we have tried multiple pain medications and he will not continue to take them as he says he has difficulty sleeping. He has trazodone prescribed, he breaks it up into multiple pieces and takes a little bit at night but only "as needed." He and his wife had met with Lavella Hammock, and we are working on getting him an appointment with psychiatry for depression.  In regards to his chemotherapy, he realistically has no complaints. He had no nausea, vomiting, or change in his bowel habits. He has had problems with constipation because of pain medication, but this is more chronic. He has some pain in the left forearm after his last chemotherapy, but states this is improving. He is willing to receive a Port-A-Cath.   MEDICAL HISTORY: Past Medical History  Diagnosis Date  . COPD (chronic obstructive pulmonary disease)   . Inflammatory bowel disease   . Constipation   . Lesion of bladder   . Prostate mass   . Dysuria-frequency syndrome     wears depends  . Arthritis   . Shortness of breath   . Depression   . Anxiety   . Kidney stones   . GERD (gastroesophageal reflux disease)   . 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     No history exists.     is allergic to lyrica;  gabapentin; and morphine and related.  Mr. Gehring does not currently have medications on file.  SURGICAL HISTORY: Past Surgical History  Procedure Laterality Date  . Back surgery    . Left shoulder      arthroscopy  . Rt knee arthroscopy    . Colonoscopy N/A 01/11/2013    Procedure: COLONOSCOPY;  Surgeon: [name], MD;  Location[facility];  Service: Endoscopy;  Laterality: N/A;  340  . Carpal tunnel release Right   . Transurethral resection of bladder tumor N/A 09/21/2013    Procedure: TRANSURETHRAL RESECTION  PROSTATE  AND BLADDER ;  Surgeon: [name], MD;  Location: [facility];  Service: Urology;  Laterality: N/A;  . Video bronchoscopy with endobronchial navigation Right 10/25/2013    Procedure: VIDEO BRONCHOSCOPY WITH ENDOBRONCHIAL NAVIGATION;  Surgeon: [name], MD;  Location: [facility];  Service: Thoracic;  Laterality: Right;  . Transurethral resection of bladder tumor with gyrus (turbt-gyrus) N/A 01/18/2014    Procedure: TRANSURETHRAL RESECTION OF BLADDER TUMOR WITH GYRUS (TURBT-GYRUS);  Surgeon: [name], MD;  Location: [facility];  Service: Urology;  Laterality: N/A;  . Transurethral resection of prostate N/A 01/18/2014    Procedure: TRANSURETHRAL RESECTION OF THE PROSTATE (TURP);  Surgeon: [name], MD;  Location: [facility];  Service: Urology;  Laterality: N/A;    SOCIAL HISTORY: History   Social History  . Marital Status: Married  Spouse Name: N/A    Number of Children: N/A  . Years of Education: N/A   Occupational History  . Not on file.   Social History Main Topics  . Smoking status: Current Every Day Smoker -- 1.00 packs/day for 40 years    Types: Cigarettes  . Smokeless tobacco: Never Used     Comment: 1 pack a day since age 89  . Alcohol Use: No  . Drug Use: No  . Sexual Activity: Not on file   Other Topics Concern  . Not on file   Social History Narrative    FAMILY HISTORY: Family History  Problem Relation Age  of Onset  . Diabetes Sister   . Cancer Brother     Review of Systems  Constitutional: Positive for weight loss and malaise/fatigue. Negative for fever, chills and diaphoresis.       Weight loss is chronic over several years  HENT: Negative for congestion, ear discharge, ear pain, hearing loss, nosebleeds, sore throat and tinnitus.   Eyes: Negative.   Respiratory: Negative.  Negative for stridor.   Cardiovascular: Negative for chest pain, palpitations, orthopnea, claudication and PND.  Gastrointestinal: Positive for abdominal pain, diarrhea and constipation.       Abdominal paiin is also a chronic issue, he describes both upper cramping pain, and continuous pelvis pain  Genitourinary: Negative.  Negative for hematuria.  Musculoskeletal: Positive for back pain and joint pain.  Skin: Negative.   Neurological: Positive for weakness. Negative for headaches.  Endo/Heme/Allergies: Negative.   Psychiatric/Behavioral: Positive for depression. Negative for suicidal ideas. The patient is nervous/anxious and has insomnia.     PHYSICAL EXAMINATION  ECOG PERFORMANCE STATUS: 1 - Symptomatic but completely ambulatory  Filed Vitals:   02/21/14 1220  BP: 127/69  Pulse: 66  Temp: 98.1 F (36.7 C)  Resp: 18    Physical Exam  Constitutional: He is oriented to person, place, and time.  Thin  HENT:  Head: Normocephalic and atraumatic.  Nose: Nose normal.  Mouth/Throat: Oropharynx is clear and moist. No oropharyngeal exudate.  Eyes: Conjunctivae and EOM are normal. Pupils are equal, round, and reactive to light. Right eye exhibits no discharge. Left eye exhibits no discharge. No scleral icterus.  Neck: Normal range of motion. Neck supple. No tracheal deviation present. No thyromegaly present.  Cardiovascular: Normal rate, regular rhythm and normal heart sounds.  Exam reveals no gallop and no friction rub.   No murmur heard. Pulmonary/Chest: Effort normal and breath sounds normal. He has no  wheezes. He has no rales.  Abdominal: Soft. Bowel sounds are normal. He exhibits no distension and no mass. There is no tenderness. There is no rebound and no guarding.  Musculoskeletal: Normal range of motion. He exhibits no edema.  L arm examined, mild erythema at prior chemotherapy IV site, no swelling, no significant pain with palpation over the area  Lymphadenopathy:    He has no cervical adenopathy.  Neurological: He is alert and oriented to person, place, and time. He has normal reflexes. No cranial nerve deficit. Gait normal. Coordination normal.  Skin: Skin is warm and dry. No rash noted.  Psychiatric: Mood, memory, affect and judgment normal.  Nursing note and vitals reviewed.   LABORATORY DATA:  CBC    Component Value Date/Time   WBC 2.3* 02/21/2014 1155   RBC 4.34 02/21/2014 1155   HGB 13.7 02/21/2014 1155   HCT 41.3 02/21/2014 1155   PLT 120* 02/21/2014 1155   MCV 95.2 02/21/2014 1155  MCH 31.6 02/21/2014 1155   MCHC 33.2 02/21/2014 1155   RDW 13.2 02/21/2014 1155   LYMPHSABS 1.0 02/21/2014 1155   MONOABS 0.0* 02/21/2014 1155   EOSABS 0.0 02/21/2014 1155   BASOSABS 0.0 02/21/2014 1155   CMP     Component Value Date/Time   NA 138 02/21/2014 1155   K 4.5 02/21/2014 1155   CL 104 02/21/2014 1155   CO2 29 02/21/2014 1155   GLUCOSE 116* 02/21/2014 1155   BUN 18 02/21/2014 1155   CREATININE 0.94 02/21/2014 1155   CALCIUM 9.3 02/21/2014 1155   PROT 6.9 02/21/2014 1155   ALBUMIN 4.2 02/21/2014 1155   AST 17 02/21/2014 1155   ALT 14 02/21/2014 1155   ALKPHOS 70 02/21/2014 1155   BILITOT 0.5 02/21/2014 1155   GFRNONAA 88* 02/21/2014 1155   GFRAA >90 02/21/2014 1155      ASSESSMENT and THERAPY PLAN:    Bladder cancer 64 year old male with 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 He did quite well with his radiation therapy. He is due today for cycle 1 day 8  of carboplatin and Gemzar. He has multiple complaints most of which are chronic. He did really well from a chemotherapy perspective.  I have advised the patient and his wife would like them to continue to work with Lavella Hammock. I feel many of Kemani's major issues are depression related. I discussed with him taking his trazodone as prescribed. I advised him he may get multiple benefits not just better sleep, better mood, but also perhaps some improvement in his pain. I will see him back again prior to cycle 2 day 1. We will arrange for "Port-A-Cath placement in radiology.   Neoplasm related pain I again spent time reviewing our options for treatment of his pain. If he takes 2 tablets of his short acting pain medication, he states his pain goes away. The only time he does this is when he has a doctor's appointment. He says he cannot take pain medication most of the time because it will keep him from sleeping. I have tried him on long-acting pain medications as well. We will continue to work with him moving forward on these issues.   Tobacco abuse He continues to smoke. I discussed options to help him stop smoking. He currently is unwilling to quit.    A total of 40 minutes was spent with the patient his wife and son in discussion of his multiple medical isseus.  All questions were answered. The patient knows to call the clinic with any problems, questions or concerns. We can certainly see the patient much sooner if necessary.   Molli Hazard 02/25/2014

## 2014-02-27 ENCOUNTER — Ambulatory Visit (HOSPITAL_COMMUNITY): Payer: Medicare Other

## 2014-02-27 ENCOUNTER — Inpatient Hospital Stay (HOSPITAL_COMMUNITY): Admission: RE | Admit: 2014-02-27 | Payer: Medicare Other | Source: Ambulatory Visit

## 2014-02-28 ENCOUNTER — Telehealth (HOSPITAL_COMMUNITY): Payer: Self-pay | Admitting: *Deleted

## 2014-02-28 ENCOUNTER — Other Ambulatory Visit: Payer: Self-pay | Admitting: Radiology

## 2014-02-28 ENCOUNTER — Other Ambulatory Visit (HOSPITAL_COMMUNITY): Payer: Self-pay | Admitting: *Deleted

## 2014-02-28 ENCOUNTER — Encounter (HOSPITAL_COMMUNITY): Payer: Medicare Other

## 2014-02-28 ENCOUNTER — Inpatient Hospital Stay (HOSPITAL_COMMUNITY): Payer: Medicare Other

## 2014-02-28 DIAGNOSIS — C679 Malignant neoplasm of bladder, unspecified: Secondary | ICD-10-CM

## 2014-02-28 LAB — URINALYSIS, ROUTINE W REFLEX MICROSCOPIC
GLUCOSE, UA: NEGATIVE mg/dL
Ketones, ur: NEGATIVE mg/dL
Leukocytes, UA: NEGATIVE
Nitrite: NEGATIVE
Protein, ur: >300 mg/dL — AB
Specific Gravity, Urine: >1.03 — ABNORMAL HIGH (ref 1.005–1.030)
Urobilinogen, UA: 0.2 mg/dL (ref 0.0–1.0)
pH: 6.5 (ref 5.0–8.0)

## 2014-02-28 LAB — URINE MICROSCOPIC-ADD ON

## 2014-02-28 NOTE — Telephone Encounter (Signed)
Pt states that he is burning all the time at present. He used to only burn at the end of his urine stream. I will check with MD or PA to see if we can have pt provide [place] with a urine specimen.

## 2014-02-28 NOTE — Telephone Encounter (Signed)
Problem #1: Patient has a rash to his BLE that itches some. Wife states that there are red dots all over his legs. I explained to his wife that this is probably coming from the Gemzar chemo. Wife said that this is the least thing bothering him at this time.   Problem #2: Last BM was this morning but "it liked to have killed him". It was extremely hard. His wife told him that he needs to take more Senna and Milk of Magnesia but he doesn't want to take it until night time because night time is his usual time to take his BM medications. Patient will not do what we recommend for BMs. He has been doing it his way so long that he doesn't really want to do it any other way. He wants to have BMs first thing in the mornings. Patient only took 2 Senna tablets last pm along with a Colace. He did take Milk of Magnesia yday also (2 capfuls). I instructed pt's wife to start having him take 4 Senna at a time and take Milk of Magnesia 2 capfuls (all at same time). I instructed them that they could go up to 8 Senna tablets in a 24 hour period.  Problem #3: Can not hold urine - this has worsened since starting chemo. He puts a towel in his diaper to help apply pressure to his penile area to help relieve pain. Last night he soaked 12 towels. He used to only drip in the towels. Dr. Luan Pulling wrote a prescription for Rapaflo but insurance wouldn't cover it. So Dr. Luan Pulling wrote a RX for a generic Flomax but so far it isn't working. He has been taking this since Friday January 15.  Problem #4: Patient hasn't eaten anything substantial x3 days. He has been snacking on Nutter Butter peanut cookies. One Boost per day over the last 3 days. Drinking Coke, water, and Pomegranate juice. Wife states he has been taking in liquids. Appetite is less than 25%.   Problem #5: Patient is not getting any sleep. He is getting up ALL night to urinate. Pt tried taking 1/2 Trazodone and 1/2 Xanax but he doesn't sleep. He then ends up taking a whole  Trazodone and a whole Xanax. He then sleeps 30 minutes - 1 hour but then has to get up to urinate.  Patient supposed to go and get his port-a-cath put in tomorrow. Wife is unsure if he will be able to get up and get moving enough to get port placed tomorrow and/or if he will be strong enough physically to get there.   I asked patient's wife if she thought we needed to stop chemo and pursue other options or continue. She wasn't sure. She said he has been this bad one other time during his 4.5 years of being in the bed. Patient's biggest problem is that his bowels are hard, he can't sleep, and he is urinating all the time.

## 2014-02-28 NOTE — Telephone Encounter (Signed)
Wife instructed that she could obtain a urine specimen and bring it into the [facility].

## 2014-03-01 ENCOUNTER — Ambulatory Visit (HOSPITAL_COMMUNITY): Admission: RE | Admit: 2014-03-01 | Payer: Medicare Other | Source: Ambulatory Visit

## 2014-03-01 ENCOUNTER — Inpatient Hospital Stay (HOSPITAL_COMMUNITY): Admission: RE | Admit: 2014-03-01 | Payer: Medicare Other | Source: Ambulatory Visit

## 2014-03-04 NOTE — Assessment & Plan Note (Signed)
S/P radiation therapy.

## 2014-03-04 NOTE — Progress Notes (Signed)
Error

## 2014-03-04 NOTE — Assessment & Plan Note (Signed)
High grade urothelial 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  Beginning systemic chemotherapy consisting of Carboplatin/Gemzar on 02/14/2013 with excellent tolerability.  Pre-chemotherapy labs as planned. Return in 1 week for day 8 of therapy and 3 weeks for follow-up and to start cycle 3 of therapy.

## 2014-03-05 ENCOUNTER — Other Ambulatory Visit (HOSPITAL_COMMUNITY): Payer: Self-pay | Admitting: *Deleted

## 2014-03-05 DIAGNOSIS — C3492 Malignant neoplasm of unspecified part of left bronchus or lung: Secondary | ICD-10-CM

## 2014-03-05 MED ORDER — OXYCODONE-ACETAMINOPHEN 10-325 MG PO TABS: 0.5000 | ORAL_TABLET | ORAL | Status: DC

## 2014-03-06 ENCOUNTER — Other Ambulatory Visit: Payer: Self-pay | Admitting: Radiology

## 2014-03-07 ENCOUNTER — Ambulatory Visit (HOSPITAL_COMMUNITY): Payer: Self-pay | Admitting: Oncology

## 2014-03-07 ENCOUNTER — Ambulatory Visit (HOSPITAL_COMMUNITY)
Admission: RE | Admit: 2014-03-07 | Discharge: 2014-03-07 | Disposition: A | Discharge status: Home / Self Care | Payer: Medicare Other | Source: Ambulatory Visit | Attending: Hematology & Oncology | Admitting: Hematology & Oncology

## 2014-03-07 ENCOUNTER — Inpatient Hospital Stay (HOSPITAL_COMMUNITY): Payer: Medicare Other

## 2014-03-07 ENCOUNTER — Ambulatory Visit (HOSPITAL_COMMUNITY): Payer: Self-pay | Admitting: Hematology & Oncology

## 2014-03-07 ENCOUNTER — Other Ambulatory Visit (HOSPITAL_COMMUNITY): Payer: Self-pay | Admitting: Hematology & Oncology

## 2014-03-07 ENCOUNTER — Encounter (HOSPITAL_COMMUNITY): Payer: Self-pay

## 2014-03-07 ENCOUNTER — Ambulatory Visit (HOSPITAL_COMMUNITY): Payer: Medicare Other | Admitting: Hematology & Oncology

## 2014-03-07 DIAGNOSIS — Z452 Encounter for adjustment and management of vascular access device: Secondary | ICD-10-CM | POA: Diagnosis present

## 2014-03-07 DIAGNOSIS — C679 Malignant neoplasm of bladder, unspecified: Secondary | ICD-10-CM | POA: Diagnosis not present

## 2014-03-07 DIAGNOSIS — C3492 Malignant neoplasm of unspecified part of left bronchus or lung: Secondary | ICD-10-CM | POA: Insufficient documentation

## 2014-03-07 DIAGNOSIS — F419 Anxiety disorder, unspecified: Secondary | ICD-10-CM | POA: Insufficient documentation

## 2014-03-07 DIAGNOSIS — F17210 Nicotine dependence, cigarettes, uncomplicated: Secondary | ICD-10-CM | POA: Insufficient documentation

## 2014-03-07 DIAGNOSIS — K219 Gastro-esophageal reflux disease without esophagitis: Secondary | ICD-10-CM | POA: Diagnosis not present

## 2014-03-07 DIAGNOSIS — J449 Chronic obstructive pulmonary disease, unspecified: Secondary | ICD-10-CM | POA: Diagnosis not present

## 2014-03-07 DIAGNOSIS — F329 Major depressive disorder, single episode, unspecified: Secondary | ICD-10-CM | POA: Diagnosis not present

## 2014-03-07 DIAGNOSIS — C61 Malignant neoplasm of prostate: Secondary | ICD-10-CM | POA: Insufficient documentation

## 2014-03-07 DIAGNOSIS — Z79899 Other long term (current) drug therapy: Secondary | ICD-10-CM | POA: Diagnosis not present

## 2014-03-07 LAB — CBC WITH DIFFERENTIAL/PLATELET
Basophils Absolute: 0 10*3/uL (ref 0.0–0.1)
Basophils Relative: 3 % — ABNORMAL HIGH (ref 0–1)
Eosinophils Absolute: 0 10*3/uL (ref 0.0–0.7)
Eosinophils Relative: 1 % (ref 0–5)
HCT: 34.3 % — ABNORMAL LOW (ref 39.0–52.0)
Hemoglobin: 11.8 g/dL — ABNORMAL LOW (ref 13.0–17.0)
LYMPHS ABS: 1 10*3/uL (ref 0.7–4.0)
Lymphocytes Relative: 64 % — ABNORMAL HIGH (ref 12–46)
MCH: 31.5 pg (ref 26.0–34.0)
MCHC: 34.4 g/dL (ref 30.0–36.0)
MCV: 91.5 fL (ref 78.0–100.0)
MONOS PCT: 16 % — AB (ref 3–12)
Monocytes Absolute: 0.3 10*3/uL (ref 0.1–1.0)
NEUTROS PCT: 16 % — AB (ref 43–77)
Neutro Abs: 0.3 10*3/uL — ABNORMAL LOW (ref 1.7–7.7)
Platelets: 107 10*3/uL — ABNORMAL LOW (ref 150–400)
RBC: 3.75 MIL/uL — ABNORMAL LOW (ref 4.22–5.81)
RDW: 12.6 % (ref 11.5–15.5)
WBC: 1.6 10*3/uL — AB (ref 4.0–10.5)

## 2014-03-07 LAB — PROTIME-INR
INR: 0.95 (ref 0.00–1.49)
PROTHROMBIN TIME: 12.8 s (ref 11.6–15.2)

## 2014-03-07 MED ORDER — MIDAZOLAM HCL 2 MG/2ML IJ SOLN
INTRAMUSCULAR | Status: AC | PRN
  Administered 2014-03-07 (×3): 1 mg via INTRAVENOUS

## 2014-03-07 MED ORDER — HEPARIN SOD (PORK) LOCK FLUSH 100 UNIT/ML IV SOLN
INTRAVENOUS | Status: AC | PRN
  Administered 2014-03-07: 500 [IU]

## 2014-03-07 MED ORDER — MIDAZOLAM HCL 2 MG/2ML IJ SOLN
INTRAMUSCULAR | Status: AC
Start: 2014-03-07 — End: 2014-03-07
  Filled 2014-03-07: qty 6

## 2014-03-07 MED ORDER — HEPARIN SOD (PORK) LOCK FLUSH 100 UNIT/ML IV SOLN
INTRAVENOUS | Status: AC
Start: 2014-03-07 — End: 2014-03-07
  Filled 2014-03-07: qty 5

## 2014-03-07 MED ORDER — LIDOCAINE-EPINEPHRINE 2 %-1:100000 IJ SOLN
INTRAMUSCULAR | Status: AC
  Filled 2014-03-07: qty 1

## 2014-03-07 MED ORDER — FENTANYL CITRATE 0.05 MG/ML IJ SOLN
INTRAMUSCULAR | Status: AC
  Filled 2014-03-07: qty 4

## 2014-03-07 MED ORDER — SODIUM CHLORIDE 0.9 % IV SOLN
INTRAVENOUS | Status: DC
  Administered 2014-03-07: 11:00:00 via INTRAVENOUS

## 2014-03-07 MED ORDER — CEFAZOLIN SODIUM-DEXTROSE 2-3 GM-% IV SOLR
INTRAVENOUS | Status: AC
  Filled 2014-03-07: qty 50

## 2014-03-07 MED ORDER — LIDOCAINE-PRILOCAINE 2.5-2.5 % EX CREA: 1.0000 "application " | TOPICAL_CREAM | CUTANEOUS | Status: AC | PRN

## 2014-03-07 MED ORDER — FENTANYL CITRATE 0.05 MG/ML IJ SOLN
INTRAMUSCULAR | Status: AC | PRN
  Administered 2014-03-07 (×2): 25 ug via INTRAVENOUS
  Administered 2014-03-07: 50 ug via INTRAVENOUS
  Administered 2014-03-07: 25 ug via INTRAVENOUS

## 2014-03-07 MED ORDER — CEFAZOLIN SODIUM-DEXTROSE 2-3 GM-% IV SOLR
2.0000 g | Freq: Once | INTRAVENOUS | Status: AC
  Administered 2014-03-07: 2 g via INTRAVENOUS

## 2014-03-07 MED ORDER — LIDOCAINE HCL 1 % IJ SOLN
INTRAMUSCULAR | Status: AC
  Filled 2014-03-07: qty 20

## 2014-03-07 NOTE — H&P (Signed)
Chief Complaint: "I am here for a port."  Referring Physician(s): Penland,[name] Kristen  History of Present Illness: Brian Sims is a 64 y.o. 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 He has been undergoing chemotherapy with c/o pain in his extremities from IV access and chemo. He has been seen in the office by Dr. Whitney Muse on 02/25/14 and scheduled today for an image guided port a catheter. The patient has had a H&P performed within the last 30 days, all history, medications, and exam have been reviewed. The patient denies any interval changes since the H&P. He denies any fever or chills and has previously tolerated sedation during a colonoscopy.    Past Medical History  Diagnosis Date  . COPD (chronic obstructive pulmonary disease)   . Inflammatory bowel disease   . Constipation   . Lesion of bladder   . Prostate mass   . Dysuria-frequency syndrome     wears depends  . Arthritis   . Shortness of breath   . Depression   . Anxiety   . Kidney stones   . GERD (gastroesophageal reflux disease)   . Cancer 2015    LT lung, bladder and prostate    Past Surgical History  Procedure Laterality Date  . Back surgery    . Left shoulder      arthroscopy  . Rt knee arthroscopy    . Colonoscopy N/A 01/11/2013    Procedure: COLONOSCOPY;  Surgeon: [name], MD;  [facility];  Service: Endoscopy;  Laterality: N/A;  340  . Carpal tunnel release Right   . Transurethral resection of bladder tumor N/A 09/21/2013    Procedure: TRANSURETHRAL RESECTION  PROSTATE  AND BLADDER ;  Surgeon: [name], MD;  Location: [facility];  Service: Urology;  Laterality: N/A;  . Video bronchoscopy with endobronchial navigation Right 10/25/2013    Procedure: VIDEO BRONCHOSCOPY WITH ENDOBRONCHIAL NAVIGATION;  Surgeon: [name], MD;  Location: [place];  Service:  Thoracic;  Laterality: Right;  . Transurethral resection of bladder tumor with gyrus (turbt-gyrus) N/A 01/18/2014    Procedure: TRANSURETHRAL RESECTION OF BLADDER TUMOR WITH GYRUS (TURBT-GYRUS);  Surgeon: [name], MD;  Location: [facility];  Service: Urology;  Laterality: N/A;  . Transurethral resection of prostate N/A 01/18/2014    Procedure: TRANSURETHRAL RESECTION OF THE PROSTATE (TURP);  Surgeon: [name], MD;  Location: [facility];  Service: Urology;  Laterality: N/A;    Allergies: Lyrica; Gabapentin; and Morphine and related  Medications: Prior to Admission medications   Medication Sig Start Date End Date Taking? Authorizing Provider  ALPRAZolam Duanne Moron) 1 MG tablet Take 0.5 mg by mouth every 2 (two) hours.    Yes Historical Provider, MD  docusate sodium (COLACE) 100 MG capsule Take 200 mg by mouth at bedtime.   Yes Historical Provider, MD  magnesium hydroxide (MILK OF MAGNESIA) 400 MG/5ML suspension Take 30 mLs by mouth daily as needed for mild constipation.   Yes Historical Provider, MD  mirtazapine (REMERON) 45 MG tablet Take 45 mg by mouth at bedtime.  02/03/14  Yes Historical Provider, MD  Multiple Vitamins-Minerals (MULTIVITAMIN WITH MINERALS) tablet Take 1 tablet by mouth every morning. [employer] Multi Vitamin   Yes Historical Provider, MD  omeprazole (PRILOSEC) 20 MG capsule Take 20 mg by mouth 2 (two) times daily before a meal.   Yes Historical Provider, MD  ondansetron (ZOFRAN) 8 MG tablet Take 1 tablet (8 mg total) by mouth every 8 (eight) hours as needed for nausea or vomiting. 02/11/14  Yes Molli Hazard, MD  oxyCODONE-acetaminophen (PERCOCET) 10-325 MG per tablet Take 0.5 tablets by mouth every 2 (two) hours. 03/05/14  Yes Molli Hazard, MD  Propylene Glycol (SYSTANE BALANCE OP) Place 1 drop into both eyes daily.   Yes Historical Provider, MD  senna (SENOKOT) 8.6 MG TABS tablet Take 1 tablet by mouth 3 (three) times daily as needed for mild constipation.    Yes Historical Provider, MD  tamsulosin (FLOMAX) 0.4 MG CAPS capsule Take 0.4 mg by mouth daily.   Yes Historical Provider, MD  tiotropium (SPIRIVA) 18 MCG inhalation capsule Place 18 mcg into inhaler and inhale every evening.    Yes Historical Provider, MD  traZODone (DESYREL) 150 MG tablet Take 150 mg by mouth at bedtime.    Yes Historical Provider, MD  vitamin C (ASCORBIC ACID) 500 MG tablet Take 500 mg by mouth daily.   Yes Historical Provider, MD  hydrocortisone (PROCTOZONE-HC) 2.5 % rectal cream Place 1 application rectally 3 (three) times daily as needed for hemorrhoids (swelling).    Historical Provider, MD  Meth-Hyo-M Bl-Na Phos-Ph Sal (URIBEL) 118 MG CAPS Take 118 mg by mouth 2 (two) times daily.     Historical Provider, MD  morphine (MS CONTIN) 15 MG 12 hr tablet Take 1 tablet (15 mg total) by mouth every 12 (twelve) hours. Patient not taking: Reported on 02/27/2014 02/12/14   Molli Hazard, MD  PRESCRIPTION MEDICATION On Wednesdays for 21 days Carbo and Gemzar. Dr. Whitney Muse. [facility]    Historical Provider, MD  prochlorperazine (COMPAZINE) 10 MG tablet Take 1 tablet (10 mg total) by mouth every 6 (six) hours as needed for nausea or vomiting. 02/11/14   Molli Hazard, MD    Family History  Problem Relation Age of Onset  . Diabetes Sister   . Cancer Brother     History   Social History  . Marital Status: Married    Spouse Name: N/A    Number of Children: N/A  . Years of Education: N/A   Social History Main Topics  . Smoking status: Current Every Day Smoker -- 1.00 packs/day for 40 years    Types: Cigarettes  . Smokeless tobacco: Never Used     Comment: 1 pack a day since age 73  . Alcohol Use: No  . Drug Use: No  . Sexual Activity: None   Other Topics Concern  . None   Social History Narrative   Review of Systems: A 12 point ROS discussed and pertinent positives are indicated in the HPI above.  All other systems are negative.  Review  of Systems  Vital Signs: BP 105/65 mmHg  Pulse 80  Temp(Src) 98.4 F (36.9 C) (Oral)  Resp 16  SpO2 100%  Physical Exam  Constitutional: He is oriented to person, place, and time. No distress.  HENT:  Head: Normocephalic and atraumatic.  Neck: No tracheal deviation present.  Cardiovascular: Normal rate and regular rhythm.  Exam reveals no gallop and no friction rub.   No murmur heard. Pulmonary/Chest: Effort normal and breath sounds normal. No respiratory distress. He has no wheezes. He has no rales.  Abdominal: Soft. Bowel sounds are normal.  Neurological: He is alert and oriented to person, place, and time.  Skin: He is not diaphoretic.    Imaging: No results found.  Labs:  CBC:  Recent Labs  10/25/13 1211 02/07/14 1351 02/14/14 1059 02/21/14 1155  WBC 9.2 6.6 6.1 2.3*  HGB 16.6 15.3 14.7 13.7  HCT 48.0 45.9 44.6 41.3  PLT 215 231 197 120*    COAGS:  Recent Labs  10/25/13 1211  INR 0.92  APTT 30    BMP:  Recent Labs  10/25/13 1211 01/19/14 0355 02/07/14 1351 02/14/14 1059 02/21/14 1155  NA 139  --  139 139 138  K 4.0  --  3.4* 3.7 4.5  CL 101  --  106 104 104  CO2 26  --  28 31 29   GLUCOSE 113*  --  103* 94 116*  BUN 11  --  13 12 18   CALCIUM 9.7  --  9.3 9.0 9.3  CREATININE 0.90 0.84 0.90 0.91 0.94  GFRNONAA >90 >90 >90 90* 88*  GFRAA >90 >90 >90 >90 >90    LIVER FUNCTION TESTS:  Recent Labs  10/25/13 1211 02/07/14 1351 02/14/14 1059 02/21/14 1155  BILITOT 0.4 0.6 0.3 0.5  AST 16 18 17 17   ALT 8 11 11 14   ALKPHOS 83 67 72 70  PROT 7.3 6.9 6.5 6.9  ALBUMIN 4.1 4.2 4.0 4.2    Assessment and Plan: High grade urothelial carcinoma of the bladder with prostatic duct invasion NSCLC left lung s/p radiation treatments 12/2013 Seen in the office by Dr. Whitney Muse on 02/25/14 Scheduled today for an image guided port a catheter with moderate sedation Patient has been NPO, no blood thinners taken, afebrile, labs reviewed Risks and  Benefits discussed with the patient. All of the patient's questions were answered, patient is agreeable to proceed. Consent signed and in chart.   SignedHedy Jacob 03/07/2014, 10:26 AM

## 2014-03-07 NOTE — Discharge Instructions (Signed)
Implanted Port Insertion, Care After °Refer to this sheet in the next few weeks. These instructions provide you with information on caring for yourself after your procedure. Your health care provider may also give you more specific instructions. Your treatment has been planned according to current medical practices, but problems sometimes occur. Call your health care provider if you have any problems or questions after your procedure. °WHAT TO EXPECT AFTER THE PROCEDURE °After your procedure, it is typical to have the following:  °· Discomfort at the port insertion site. Ice packs to the area will help. °· Bruising on the skin over the port. This will subside in 3-4 days. °HOME CARE INSTRUCTIONS °· After your port is placed, you will get a manufacturer's information card. The card has information about your port. Keep this card with you at all times.   °· Know what kind of port you have. There are many types of ports available.   °· Wear a medical alert bracelet in case of an emergency. This can help alert health care workers that you have a port.   °· The port can stay in for as long as your health care provider believes it is necessary.   °· A home health care nurse may give medicines and take care of the port.   °· You or a family member can get special training and directions for giving medicine and taking care of the port at home.   °SEEK MEDICAL CARE IF:  °· Your port does not flush or you are unable to get a blood return.   °· You have a fever or chills. °SEEK IMMEDIATE MEDICAL CARE IF: °· You have new fluid or pus coming from your incision.   °· You notice a bad smell coming from your incision site.   °· You have swelling, pain, or more redness at the incision or port site.   °· You have chest pain or shortness of breath. °Document Released: 11/16/2012 Document Revised: 01/31/2013 Document Reviewed: 11/16/2012 °ExitCare® Patient Information ©2015 [employer]. This information is not intended to replace  advice given to you by your health care provider. Make sure you discuss any questions you have with your health care provider. °Implanted Port Home Guide °An implanted port is a type of central line that is placed under the skin. Central lines are used to provide IV access when treatment or nutrition needs to be given through a person's veins. Implanted ports are used for long-term IV access. An implanted port may be placed because:  °· You need IV medicine that would be irritating to the small veins in your hands or arms.   °· You need long-term IV medicines, such as antibiotics.   °· You need IV nutrition for a long period.   °· You need frequent blood draws for lab tests.   °· You need dialysis.   °Implanted ports are usually placed in the chest area, but they can also be placed in the upper arm, the abdomen, or the leg. An implanted port has two main parts:  °· Reservoir. The reservoir is round and will appear as a small, raised area under your skin. The reservoir is the part where a needle is inserted to give medicines or draw blood.   °· Catheter. The catheter is a thin, flexible tube that extends from the reservoir. The catheter is placed into a large vein. Medicine that is inserted into the reservoir goes into the catheter and then into the vein.   °HOW WILL I CARE FOR MY INCISION SITE? °Do not get the incision site wet. Bathe or   shower as directed by your health care provider.  °HOW IS MY PORT ACCESSED? °Special steps must be taken to access the port:  °· Before the port is accessed, a numbing cream can be placed on the skin. This helps numb the skin over the port site.   °· Your health care provider uses a sterile technique to access the port. °· Your health care provider must put on a mask and sterile gloves. °· The skin over your port is cleaned carefully with an antiseptic and allowed to dry. °· The port is gently pinched between sterile gloves, and a needle is inserted into the port. °· Only  "non-coring" port needles should be used to access the port. Once the port is accessed, a blood return should be checked. This helps ensure that the port is in the vein and is not clogged.   °· If your port needs to remain accessed for a constant infusion, a clear (transparent) bandage will be placed over the needle site. The bandage and needle will need to be changed every week, or as directed by your health care provider.   °· Keep the bandage covering the needle clean and dry. Do not get it wet. Follow your health care provider's instructions on how to take a shower or bath while the port is accessed.   °· If your port does not need to stay accessed, no bandage is needed over the port.   °WHAT IS FLUSHING? °Flushing helps keep the port from getting clogged. Follow your health care provider's instructions on how and when to flush the port. Ports are usually flushed with saline solution or a medicine called heparin. The need for flushing will depend on how the port is used.  °· If the port is used for intermittent medicines or blood draws, the port will need to be flushed:   °· After medicines have been given.   °· After blood has been drawn.   °· As part of routine maintenance.   °· If a constant infusion is running, the port may not need to be flushed.   °HOW LONG WILL MY PORT STAY IMPLANTED? °The port can stay in for as long as your health care provider thinks it is needed. When it is time for the port to come out, surgery will be done to remove it. The procedure is similar to the one performed when the port was put in.  °WHEN SHOULD I SEEK IMMEDIATE MEDICAL CARE? °When you have an implanted port, you should seek immediate medical care if:  °· You notice a bad smell coming from the incision site.   °· You have swelling, redness, or drainage at the incision site.   °· You have more swelling or pain at the port site or the surrounding area.   °· You have a fever that is not controlled with medicine. °Document  Released: 01/26/2005 Document Revised: 11/16/2012 Document Reviewed: 10/03/2012 °ExitCare® Patient Information ©2015 [employer]. This information is not intended to replace advice given to you by your health care provider. Make sure you discuss any questions you have with your health care provider.Conscious Sedation, Adult, Care After °Refer to this sheet in the next few weeks. These instructions provide you with information on caring for yourself after your procedure. Your health care provider may also give you more specific instructions. Your treatment has been planned according to current medical practices, but problems sometimes occur. Call your health care provider if you have any problems or questions after your procedure. °WHAT TO EXPECT AFTER THE PROCEDURE  °After your procedure: °·   You may feel sleepy, clumsy, and have poor balance for several hours. °· Vomiting may occur if you eat too soon after the procedure. °HOME CARE INSTRUCTIONS °· Do not participate in any activities where you could become injured for at least 24 hours. Do not: °¨ Drive. °¨ Swim. °¨ Ride a bicycle. °¨ Operate heavy machinery. °¨ Cook. °¨ Use power tools. °¨ Climb ladders. °¨ Work from a high place. °· Do not make important decisions or sign legal documents until you are improved. °· If you vomit, drink water, juice, or soup when you can drink without vomiting. Make sure you have little or no nausea before eating solid foods. °· Only take over-the-counter or prescription medicines for pain, discomfort, or fever as directed by your health care provider. °· Make sure you and your family fully understand everything about the medicines given to you, including what side effects may occur. °· You should not drink alcohol, take sleeping pills, or take medicines that cause drowsiness for at least 24 hours. °· If you smoke, do not smoke without supervision. °· If you are feeling better, you may resume normal activities 24 hours after you  were sedated. °· Keep all appointments with your health care provider. °SEEK MEDICAL CARE IF: °· Your skin is pale or bluish in color. °· You continue to feel nauseous or vomit. °· Your pain is getting worse and is not helped by medicine. °· You have bleeding or swelling. °· You are still sleepy or feeling clumsy after 24 hours. °SEEK IMMEDIATE MEDICAL CARE IF: °· You develop a rash. °· You have difficulty breathing. °· You develop any type of allergic problem. °· You have a fever. °MAKE SURE YOU: °· Understand these instructions. °· Will watch your condition. °· Will get help right away if you are not doing well or get worse. °Document Released: 11/16/2012 Document Reviewed: 11/16/2012 °ExitCare® Patient Information ©2015 [employer]. This information is not intended to replace advice given to you by your health care provider. Make sure you discuss any questions you have with your health care provider. ° °

## 2014-03-07 NOTE — Procedures (Signed)
Interventional Radiology Procedure Note  Procedure: Placement of a right IJ approach single lumen PowerPort.  Tip is positioned at the superior cavoatrial junction and catheter is ready for immediate use.  Complications: No immediate Recommendations:  - Ok to shower tomorrow - Do not submerge for 7 days - Routine line care   Signed,  Jeyla Bulger K. Mattea Seger, MD   

## 2014-03-08 ENCOUNTER — Inpatient Hospital Stay (HOSPITAL_COMMUNITY): Payer: Medicare Other

## 2014-03-13 ENCOUNTER — Encounter (HOSPITAL_COMMUNITY): Payer: Self-pay | Admitting: *Deleted

## 2014-03-14 ENCOUNTER — Encounter (HOSPITAL_COMMUNITY): Payer: Self-pay

## 2014-03-14 ENCOUNTER — Encounter (HOSPITAL_COMMUNITY): Payer: Medicare Other | Attending: Hematology & Oncology

## 2014-03-14 DIAGNOSIS — C679 Malignant neoplasm of bladder, unspecified: Secondary | ICD-10-CM

## 2014-03-14 DIAGNOSIS — R319 Hematuria, unspecified: Secondary | ICD-10-CM | POA: Insufficient documentation

## 2014-03-14 DIAGNOSIS — Z720 Tobacco use: Secondary | ICD-10-CM | POA: Diagnosis not present

## 2014-03-14 DIAGNOSIS — Z5111 Encounter for antineoplastic chemotherapy: Secondary | ICD-10-CM

## 2014-03-14 LAB — COMPREHENSIVE METABOLIC PANEL
ALBUMIN: 3.8 g/dL (ref 3.5–5.2)
ALT: 11 U/L (ref 0–53)
AST: 19 U/L (ref 0–37)
Alkaline Phosphatase: 63 U/L (ref 39–117)
Anion gap: 3 — ABNORMAL LOW (ref 5–15)
BILIRUBIN TOTAL: 0.4 mg/dL (ref 0.3–1.2)
BUN: 9 mg/dL (ref 6–23)
CALCIUM: 9 mg/dL (ref 8.4–10.5)
CO2: 27 mmol/L (ref 19–32)
Chloride: 110 mmol/L (ref 96–112)
Creatinine, Ser: 0.86 mg/dL (ref 0.50–1.35)
GFR calc non Af Amer: >90 mL/min (ref >90)
Glucose, Bld: 105 mg/dL — ABNORMAL HIGH (ref 70–99)
Potassium: 3.8 mmol/L (ref 3.5–5.1)
Sodium: 140 mmol/L (ref 135–145)
Total Protein: 6.8 g/dL (ref 6.0–8.3)

## 2014-03-14 LAB — CBC WITH DIFFERENTIAL/PLATELET
BASOS PCT: 1 % (ref 0–1)
Basophils Absolute: 0 10*3/uL (ref 0.0–0.1)
Eosinophils Absolute: 0 10*3/uL (ref 0.0–0.7)
Eosinophils Relative: 1 % (ref 0–5)
HCT: 36.6 % — ABNORMAL LOW (ref 39.0–52.0)
HEMOGLOBIN: 12.2 g/dL — AB (ref 13.0–17.0)
Lymphocytes Relative: 37 % (ref 12–46)
Lymphs Abs: 1.3 10*3/uL (ref 0.7–4.0)
MCH: 31.6 pg (ref 26.0–34.0)
MCHC: 33.3 g/dL (ref 30.0–36.0)
MCV: 94.8 fL (ref 78.0–100.0)
MONOS PCT: 24 % — AB (ref 3–12)
Monocytes Absolute: 0.8 10*3/uL (ref 0.1–1.0)
Neutro Abs: 1.2 10*3/uL — ABNORMAL LOW (ref 1.7–7.7)
Neutrophils Relative %: 37 % — ABNORMAL LOW (ref 43–77)
PLATELETS: 375 10*3/uL (ref 150–400)
RBC: 3.86 MIL/uL — ABNORMAL LOW (ref 4.22–5.81)
RDW: 14.9 % (ref 11.5–15.5)
WBC: 3.4 10*3/uL — ABNORMAL LOW (ref 4.0–10.5)

## 2014-03-14 MED ORDER — DEXAMETHASONE SODIUM PHOSPHATE 10 MG/ML IJ SOLN: 20.0000 mg | Freq: Once | INTRAMUSCULAR | Status: DC

## 2014-03-14 MED ORDER — SODIUM CHLORIDE 0.9 % IV SOLN: 16.0000 mg | Freq: Once | INTRAVENOUS | Status: DC

## 2014-03-14 MED ORDER — SODIUM CHLORIDE 0.9 % IV SOLN
Freq: Once | INTRAVENOUS | Status: AC
  Administered 2014-03-14: 14:00:00 via INTRAVENOUS

## 2014-03-14 MED ORDER — GEMCITABINE HCL CHEMO INJECTION 1 GM/26.3ML
800.0000 mg/m2 | Freq: Once | INTRAVENOUS | Status: AC
  Administered 2014-03-14: 1330 mg via INTRAVENOUS
  Filled 2014-03-14: qty 34.98

## 2014-03-14 MED ORDER — HEPARIN SOD (PORK) LOCK FLUSH 100 UNIT/ML IV SOLN
500.0000 [IU] | Freq: Once | INTRAVENOUS | Status: AC | PRN
  Administered 2014-03-14: 500 [IU]
  Filled 2014-03-14: qty 5

## 2014-03-14 MED ORDER — ONDANSETRON HCL 40 MG/20ML IJ SOLN
Freq: Once | INTRAMUSCULAR | Status: AC
  Administered 2014-03-14: 16 mg via INTRAVENOUS
  Filled 2014-03-14: qty 8

## 2014-03-14 MED ORDER — SODIUM CHLORIDE 0.9 % IJ SOLN: 10.0000 mL | INTRAMUSCULAR | Status: DC | PRN

## 2014-03-14 MED ORDER — SODIUM CHLORIDE 0.9 % IV SOLN
240.0000 mg | Freq: Once | INTRAVENOUS | Status: AC
  Administered 2014-03-14: 240 mg via INTRAVENOUS
  Filled 2014-03-14: qty 24

## 2014-03-14 NOTE — Patient Instructions (Signed)
[facility] Discharge Instructions for Patients Receiving Chemotherapy  Today you received the following chemotherapy agents gemzar, carbo Please call the clinic if you have any questions or concerns  To help prevent nausea and vomiting after your treatment, we encourage you to take your nausea medication    If you develop nausea and vomiting that is not controlled by your nausea medication, call the clinic. If it is after clinic hours your family physician or the after hours number for the clinic or go to the Emergency Department.   BELOW ARE SYMPTOMS THAT SHOULD BE REPORTED IMMEDIATELY:  *FEVER GREATER THAN 101.0 F  *CHILLS WITH OR WITHOUT FEVER  NAUSEA AND VOMITING THAT IS NOT CONTROLLED WITH YOUR NAUSEA MEDICATION  *UNUSUAL SHORTNESS OF BREATH  *UNUSUAL BRUISING OR BLEEDING  TENDERNESS IN MOUTH AND THROAT WITH OR WITHOUT PRESENCE OF ULCERS  *URINARY PROBLEMS  *BOWEL PROBLEMS  UNUSUAL RASH Items with * indicate a potential emergency and should be followed up as soon as possible.  One of the nurses will contact you 24 hours after your treatment. Please let the nurse know about any problems that you may have experienced. Feel free to call the clinic you have any questions or concerns. The clinic phone number is [phone].   I have been informed and understand all the instructions given to me. I know to contact the clinic, my physician, or go to the Emergency Department if any problems should occur. I do not have any questions at this time, but understand that I may call the clinic during office hours or the Patient Navigator at [phone] should I have any questions or need assistance in obtaining follow up care.

## 2014-03-14 NOTE — Progress Notes (Signed)
Linnell Fulling Tolerated chemotherapy well, discharged via wheelchair with son and wife.  Brian Sims

## 2014-03-22 ENCOUNTER — Ambulatory Visit (HOSPITAL_COMMUNITY)
Admission: RE | Admit: 2014-03-22 | Discharge: 2014-03-22 | Disposition: A | Discharge status: Home / Self Care | Payer: Medicare Other | Source: Ambulatory Visit | Attending: Oncology | Admitting: Oncology

## 2014-03-22 ENCOUNTER — Encounter (HOSPITAL_BASED_OUTPATIENT_CLINIC_OR_DEPARTMENT_OTHER): Payer: Medicare Other

## 2014-03-22 ENCOUNTER — Encounter (HOSPITAL_BASED_OUTPATIENT_CLINIC_OR_DEPARTMENT_OTHER): Payer: Medicare Other | Admitting: Oncology

## 2014-03-22 DIAGNOSIS — C672 Malignant neoplasm of lateral wall of bladder: Secondary | ICD-10-CM

## 2014-03-22 DIAGNOSIS — Z720 Tobacco use: Secondary | ICD-10-CM | POA: Insufficient documentation

## 2014-03-22 DIAGNOSIS — C679 Malignant neoplasm of bladder, unspecified: Secondary | ICD-10-CM

## 2014-03-22 DIAGNOSIS — R05 Cough: Secondary | ICD-10-CM | POA: Diagnosis not present

## 2014-03-22 DIAGNOSIS — C3492 Malignant neoplasm of unspecified part of left bronchus or lung: Secondary | ICD-10-CM

## 2014-03-22 DIAGNOSIS — C3432 Malignant neoplasm of lower lobe, left bronchus or lung: Secondary | ICD-10-CM

## 2014-03-22 DIAGNOSIS — C3490 Malignant neoplasm of unspecified part of unspecified bronchus or lung: Secondary | ICD-10-CM | POA: Insufficient documentation

## 2014-03-22 DIAGNOSIS — J449 Chronic obstructive pulmonary disease, unspecified: Secondary | ICD-10-CM | POA: Insufficient documentation

## 2014-03-22 DIAGNOSIS — Z5111 Encounter for antineoplastic chemotherapy: Secondary | ICD-10-CM

## 2014-03-22 LAB — CBC WITH DIFFERENTIAL/PLATELET
BASOS ABS: 0 10*3/uL (ref 0.0–0.1)
Basophils Relative: 1 % (ref 0–1)
Eosinophils Absolute: 0 10*3/uL (ref 0.0–0.7)
Eosinophils Relative: 0 % (ref 0–5)
HCT: 31.3 % — ABNORMAL LOW (ref 39.0–52.0)
Hemoglobin: 10.3 g/dL — ABNORMAL LOW (ref 13.0–17.0)
LYMPHS PCT: 28 % (ref 12–46)
Lymphs Abs: 1.2 10*3/uL (ref 0.7–4.0)
MCH: 31.3 pg (ref 26.0–34.0)
MCHC: 32.9 g/dL (ref 30.0–36.0)
MCV: 95.1 fL (ref 78.0–100.0)
MONO ABS: 0.3 10*3/uL (ref 0.1–1.0)
Monocytes Relative: 7 % (ref 3–12)
NEUTROS ABS: 2.8 10*3/uL (ref 1.7–7.7)
NEUTROS PCT: 64 % (ref 43–77)
PLATELETS: 157 10*3/uL (ref 150–400)
RBC: 3.29 MIL/uL — ABNORMAL LOW (ref 4.22–5.81)
RDW: 14.2 % (ref 11.5–15.5)
WBC: 4.4 10*3/uL (ref 4.0–10.5)

## 2014-03-22 LAB — COMPREHENSIVE METABOLIC PANEL WITH GFR
ALT: 14 U/L (ref 0–53)
AST: 18 U/L (ref 0–37)
Albumin: 3.6 g/dL (ref 3.5–5.2)
Alkaline Phosphatase: 61 U/L (ref 39–117)
Anion gap: 2 — ABNORMAL LOW (ref 5–15)
BUN: 13 mg/dL (ref 6–23)
CO2: 27 mmol/L (ref 19–32)
Calcium: 8.5 mg/dL (ref 8.4–10.5)
Chloride: 107 mmol/L (ref 96–112)
Creatinine, Ser: 0.79 mg/dL (ref 0.50–1.35)
GFR calc Af Amer: >90 mL/min (ref >90)
GFR calc non Af Amer: >90 mL/min (ref >90)
Glucose, Bld: 113 mg/dL — ABNORMAL HIGH (ref 70–99)
Potassium: 3.6 mmol/L (ref 3.5–5.1)
Sodium: 136 mmol/L (ref 135–145)
Total Bilirubin: 0.4 mg/dL (ref 0.3–1.2)
Total Protein: 6.3 g/dL (ref 6.0–8.3)

## 2014-03-22 MED ORDER — SODIUM CHLORIDE 0.9 % IV SOLN
240.0000 mg | Freq: Once | INTRAVENOUS | Status: AC
  Administered 2014-03-22: 240 mg via INTRAVENOUS
  Filled 2014-03-22: qty 24

## 2014-03-22 MED ORDER — SODIUM CHLORIDE 0.9 % IJ SOLN: 10.0000 mL | INTRAMUSCULAR | Status: DC | PRN

## 2014-03-22 MED ORDER — SODIUM CHLORIDE 0.9 % IV SOLN: 16.0000 mg | Freq: Once | INTRAVENOUS | Status: DC

## 2014-03-22 MED ORDER — DEXAMETHASONE SODIUM PHOSPHATE 10 MG/ML IJ SOLN: 20.0000 mg | Freq: Once | INTRAMUSCULAR | Status: DC

## 2014-03-22 MED ORDER — ONDANSETRON HCL 40 MG/20ML IJ SOLN
Freq: Once | INTRAMUSCULAR | Status: AC
  Administered 2014-03-22: 16 mg via INTRAVENOUS
  Filled 2014-03-22: qty 8

## 2014-03-22 MED ORDER — SODIUM CHLORIDE 0.9 % IV SOLN
800.0000 mg/m2 | Freq: Once | INTRAVENOUS | Status: AC
  Administered 2014-03-22: 1330 mg via INTRAVENOUS
  Filled 2014-03-22: qty 34.98

## 2014-03-22 MED ORDER — SODIUM CHLORIDE 0.9 % IV SOLN
Freq: Once | INTRAVENOUS | Status: AC
  Administered 2014-03-22: 14:00:00 via INTRAVENOUS

## 2014-03-22 MED ORDER — HEPARIN SOD (PORK) LOCK FLUSH 100 UNIT/ML IV SOLN
500.0000 [IU] | Freq: Once | INTRAVENOUS | Status: AC | PRN
  Administered 2014-03-22: 500 [IU]
  Filled 2014-03-22: qty 5

## 2014-03-22 MED ORDER — OXYCODONE-ACETAMINOPHEN 10-325 MG PO TABS
1.0000 | ORAL_TABLET | ORAL | Status: DC | PRN
Start: 2014-03-22 — End: 2014-05-03

## 2014-03-22 NOTE — Assessment & Plan Note (Signed)
High grade urothelial carcinoma with prostate invasion. Tolerating therapy well thus far. He is considering radiation therapy for symptom control which is reasonable.  He reports that he is going to touch base with Dr. [name] about this.  If he has any issues regarding this matter, he is encouraged to call us.  His insomnia and pain are better controlled at this time.  He requests a refill on his Percocet which I have provided him today.  He will return in 2 weeks for follow-up and to start cycle 3 of therapy.

## 2014-03-22 NOTE — Patient Instructions (Addendum)
[place] at [facility]  Discharge Instructions:  Rx for Percocet. Return for chemotherapy as planned.  Chest xray today due to cough.  I recommend over-the-counter Delsym for cough.  Additionally, your pain medication can help with cough as well. If cough persists, please let us know. Return for follow-up appointment in 2-3 weeks. Please call the [facility] for any questions or concerns.  _______________________________________________________________  Thank you for choosing [place] at [facility] to provide your oncology and hematology care.  To afford each patient quality time with our providers, please arrive at least 15 minutes before your scheduled appointment.  You need to re-schedule your appointment if you arrive 10 or more minutes late.  We strive to give you quality time with our providers, and arriving late affects you and other patients whose appointments are after yours.  Also, if you no show three or more times for appointments you may be dismissed from the clinic.  Again, thank you for choosing [place] at [place] hope is that these requests will allow you access to exceptional care and in a timely manner. _______________________________________________________________  If you have questions after your visit, please contact our office at [phone] between the hours of 8:30 a.m. and 5:00 p.m. Voicemails left after 4:30 p.m. will not be returned until the following business day. _______________________________________________________________  For prescription refill requests, have your pharmacy contact our office. _______________________________________________________________  Recommendations made by the consultant and any test results will be sent to your referring physician. _______________________________________________________________

## 2014-03-22 NOTE — Progress Notes (Signed)
Brian Bogus, MD Brian Sims [place] [address]  Squamous cell carcinoma of left lung - Plan: oxyCODONE-acetaminophen (PERCOCET) 10-325 MG per tablet, DG Chest 2 View  Malignant neoplasm of urinary bladder, unspecified site  Tobacco abuse  CURRENT THERAPY: Carboplatin/Gemzar C1D1 started on 02/14/2014  INTERVAL HISTORY: Brian Sims 64 y.o. 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     Squamous cell carcinoma of left lung   10/25/2013 Initial Diagnosis T1N0 SCCA of the left lower lobe    Radiation Therapy     Bladder cancer   01/18/2014 Initial Diagnosis 1. Bladder, biopsy, right lateral wall of bladder - UROTHELIAL CARCINOMA IN SITU, NO STROMAL INVASION IDENTIFIED.  3. Prostate, TUR, deep prostatic tissue - HIGH GRADE UROTHELIAL CARCINOMA WITH ASSOCIATED TUMOR NECROSIS, INVOLVING PROSTATIC DUCTS    02/14/2014 -  Chemotherapy Carboplatin/Gemzar   He reports that his pain is better controlled.  He is taking short-acting pain medication and requests a refill, which I will do.  He has a number of non-oncologic complaints including a cough.  He continues to smoke and this is likely the cause.  He denies any sputum production.  He denies any fevers or chills.  We will perform a chest xray and I have recommended OTC Delsym.  If this is ineffective, he is to call us back.  Hopefully his pain medication will help with cough as well.   He also complains about the length of time the lab takes to get results back.   He is reminded that we do not have a dedicated lab and the ED always has priority over [place].  Therefore, depending on how busy the ED is, is usually reflected in the time it requires to get our labs back.  He was offered lab appointments the day prior to therapy but "that won't work because it takes me two days to get ready for one day."  He has a number of radiation questions and these are answered to the  patient's satisfaction.  A number of them I have deferred to Dr. [name] as they are very specific questions.    He is sleeping better with Trazodone as prescribed.   Past Medical History  Diagnosis Date  . COPD (chronic obstructive pulmonary disease)   . Inflammatory bowel disease   . Constipation   . Lesion of bladder   . Prostate mass   . Dysuria-frequency syndrome     wears depends  . Arthritis   . Shortness of breath   . Depression   . Anxiety   . Kidney stones   . GERD (gastroesophageal reflux disease)   . Cancer 2015    LT lung, bladder and prostate    has IBS (irritable bowel syndrome); Back pain; Heme positive stool; Squamous cell carcinoma of left lung; Bladder cancer; Tobacco abuse; Hematuria; and Neoplasm related pain on his problem list.     is allergic to lyrica; gabapentin; and morphine and related.  Mr. Mcferran had no medications administered during this visit.  Past Surgical History  Procedure Laterality Date  . Back surgery    . Left shoulder      arthroscopy  . Rt knee arthroscopy    . Colonoscopy N/A 01/11/2013    Procedure: COLONOSCOPY;  Surgeon: [name], MD;  Location: [facility];  Service: Endoscopy;  Laterality: N/A;  340  . Carpal tunnel release Right   . Transurethral resection  of bladder tumor N/A 09/21/2013    Procedure: TRANSURETHRAL RESECTION  PROSTATE  AND BLADDER ;  Surgeon: [name], MD;  Location: [facility];  Service: Urology;  Laterality: N/A;  . Video bronchoscopy with endobronchial navigation Right 10/25/2013    Procedure: VIDEO BRONCHOSCOPY WITH ENDOBRONCHIAL NAVIGATION;  Surgeon: [name], MD;  Location: [facility];  Service: Thoracic;  Laterality: Right;  . Transurethral resection of bladder tumor with gyrus (turbt-gyrus) N/A 01/18/2014    Procedure: TRANSURETHRAL RESECTION OF BLADDER TUMOR WITH GYRUS (TURBT-GYRUS);  Surgeon: [name], MD;  Location: [facility];  Service: Urology;  Laterality: N/A;    . Transurethral resection of prostate N/A 01/18/2014    Procedure: TRANSURETHRAL RESECTION OF THE PROSTATE (TURP);  Surgeon: [name], MD;  Location: [facility];  Service: Urology;  Laterality: N/A;    Denies any headaches, dizziness, double vision, fevers, chills, night sweats, nausea, vomiting, diarrhea, constipation, chest pain, heart palpitations, shortness of breath, blood in stool, black tarry stool, urinary pain, urinary burning, urinary frequency, hematuria.   PHYSICAL EXAMINATION  ECOG PERFORMANCE STATUS: 1 - Symptomatic but completely ambulatory  There were no vitals filed for this visit.  GENERAL:alert, no distress, comfortable, cooperative and chronically ill appearing, accompanied by wife and son. SKIN: skin color, texture, turgor are normal, no rashes or significant lesions HEAD: Normocephalic, No masses, lesions, tenderness or abnormalities EYES: normal, PERRLA EARS: External ears normal OROPHARYNX:lips, buccal mucosa, and tongue normal and mucous membranes are moist  NECK: supple, trachea midline LYMPH:  not examined BREAST:not examined LUNGS: decreased breath sounds HEART: regular rate & rhythm, no murmurs and no gallops ABDOMEN:abdomen soft and normal bowel sounds BACK: Back symmetric, no curvature. EXTREMITIES:less then 2 second capillary refill, no joint deformities, effusion, or inflammation, no skin discoloration, no cyanosis  NEURO: alert & oriented x 3 with fluent speech, no focal motor/sensory deficits   LABORATORY DATA: CBC    Component Value Date/Time   WBC 4.4 03/22/2014 1135   RBC 3.29* 03/22/2014 1135   HGB 10.3* 03/22/2014 1135   HCT 31.3* 03/22/2014 1135   PLT 157 03/22/2014 1135   MCV 95.1 03/22/2014 1135   MCH 31.3 03/22/2014 1135   MCHC 32.9 03/22/2014 1135   RDW 14.2 03/22/2014 1135   LYMPHSABS 1.2 03/22/2014 1135   MONOABS 0.3 03/22/2014 1135   EOSABS 0.0 03/22/2014 1135   BASOSABS 0.0 03/22/2014 1135      Chemistry       Component Value Date/Time   NA 136 03/22/2014 1135   K 3.6 03/22/2014 1135   CL 107 03/22/2014 1135   CO2 27 03/22/2014 1135   BUN 13 03/22/2014 1135   CREATININE 0.79 03/22/2014 1135      Component Value Date/Time   CALCIUM 8.5 03/22/2014 1135   ALKPHOS 61 03/22/2014 1135   AST 18 03/22/2014 1135   ALT 14 03/22/2014 1135   BILITOT 0.4 03/22/2014 1135      RADIOGRAPHIC STUDIES:  Ir Fluoro Guide Cv Line Right  03/07/2014   CLINICAL DATA:  64 year old male with a history of urothelial carcinoma of the bladder. He requires durable central venous access for chemotherapy.  EXAM: IR RIGHT FLOURO GUIDE CV LINE; IR ULTRASOUND GUIDANCE VASC ACCESS RIGHT  Date: 03/07/2014  ANESTHESIA/SEDATION: Moderate (conscious) sedation was administered during this procedure. A total of 3 mg Versed and 125 mg Fentanyl were administered intravenously. The patient's vital signs were monitored continuously by radiology nursing throughout the course of the procedure.  Total sedation time: 32 minutes  FLUOROSCOPY TIME:  18 seconds  1.3 mGy  TECHNIQUE: The right neck and chest was prepped with chlorhexidine, and draped in the usual sterile fashion using maximum barrier technique (cap and mask, sterile gown, sterile gloves, large sterile sheet, hand hygiene and cutaneous antiseptic). Antibiotic prophylaxis was provided with 2g Ancef administered IV one hour prior to skin incision. Local anesthesia was attained by infiltration with 1% lidocaine with epinephrine.  Ultrasound demonstrated patency of the right internal jugular vein, and this was documented with an image. Under real-time ultrasound guidance, this vein was accessed with a 21 gauge micropuncture needle and image documentation was performed. A small dermatotomy was made at the access site with an 11 scalpel. A 0.018" wire was advanced into the SVC and the access needle exchanged for a 6F micropuncture vascular sheath. The 0.018" wire was then removed and a 0.035"  wire advanced into the IVC.  An appropriate location for the subcutaneous reservoir was selected below the clavicle and an incision was made through the skin and underlying soft tissues. The subcutaneous tissues were then dissected using a combination of blunt and sharp surgical technique and a pocket was formed. A single lumen power injectable portacatheter was then tunneled through the subcutaneous tissues from the pocket to the dermatotomy and the port reservoir placed within the subcutaneous pocket.  The venous access site was then serially dilated and a peel away vascular sheath placed over the wire. The wire was removed and the port catheter advanced into position under fluoroscopic guidance. The catheter tip is positioned in the upper right atrium. This was documented with a spot image. The portacatheter was then tested and found to flush and aspirate well. The port was flushed with saline followed by 100 units/mL heparinized saline.  The pocket was then closed in two layers using first subdermal inverted interrupted absorbable sutures followed by a running subcuticular suture. The epidermis was then sealed with Dermabond. The dermatotomy at the venous access site was also closed with a single inverted subdermal suture and the epidermis sealed with Dermabond.  COMPLICATIONS: None.  The patient tolerated the procedure well.  IMPRESSION: Successful placement of a right IJ approach Power Port with ultrasound and fluoroscopic guidance. The catheter is ready for use.  Signed,  Criselda Peaches, MD  [facility]  [facility]   Electronically Signed   By: Jacqulynn Cadet M.D.   On: 03/07/2014 13:49   Ir US Guide Vasc Access Right  03/07/2014   CLINICAL DATA:  64 year old male with a history of urothelial carcinoma of the bladder. He requires durable central venous access for chemotherapy.  EXAM: IR RIGHT FLOURO GUIDE CV LINE; IR ULTRASOUND GUIDANCE VASC ACCESS  RIGHT  Date: 03/07/2014  ANESTHESIA/SEDATION: Moderate (conscious) sedation was administered during this procedure. A total of 3 mg Versed and 125 mg Fentanyl were administered intravenously. The patient's vital signs were monitored continuously by radiology nursing throughout the course of the procedure.  Total sedation time: 32 minutes  FLUOROSCOPY TIME:  18 seconds  1.3 mGy  TECHNIQUE: The right neck and chest was prepped with chlorhexidine, and draped in the usual sterile fashion using maximum barrier technique (cap and mask, sterile gown, sterile gloves, large sterile sheet, hand hygiene and cutaneous antiseptic). Antibiotic prophylaxis was provided with 2g Ancef administered IV one hour prior to skin incision. Local anesthesia was attained by infiltration with 1% lidocaine with epinephrine.  Ultrasound demonstrated patency of the right internal jugular  vein, and this was documented with an image. Under real-time ultrasound guidance, this vein was accessed with a 21 gauge micropuncture needle and image documentation was performed. A small dermatotomy was made at the access site with an 11 scalpel. A 0.018" wire was advanced into the SVC and the access needle exchanged for a 58F micropuncture vascular sheath. The 0.018" wire was then removed and a 0.035" wire advanced into the IVC.  An appropriate location for the subcutaneous reservoir was selected below the clavicle and an incision was made through the skin and underlying soft tissues. The subcutaneous tissues were then dissected using a combination of blunt and sharp surgical technique and a pocket was formed. A single lumen power injectable portacatheter was then tunneled through the subcutaneous tissues from the pocket to the dermatotomy and the port reservoir placed within the subcutaneous pocket.  The venous access site was then serially dilated and a peel away vascular sheath placed over the wire. The wire was removed and the port catheter advanced into  position under fluoroscopic guidance. The catheter tip is positioned in the upper right atrium. This was documented with a spot image. The portacatheter was then tested and found to flush and aspirate well. The port was flushed with saline followed by 100 units/mL heparinized saline.  The pocket was then closed in two layers using first subdermal inverted interrupted absorbable sutures followed by a running subcuticular suture. The epidermis was then sealed with Dermabond. The dermatotomy at the venous access site was also closed with a single inverted subdermal suture and the epidermis sealed with Dermabond.  COMPLICATIONS: None.  The patient tolerated the procedure well.  IMPRESSION: Successful placement of a right IJ approach Power Port with ultrasound and fluoroscopic guidance. The catheter is ready for use.  Signed,  Criselda Peaches, MD  [facility]  [facility]   Electronically Signed   By: Jacqulynn Cadet M.D.   On: 03/07/2014 13:49      ASSESSMENT AND PLAN:  Bladder cancer High grade urothelial carcinoma with prostate invasion. Tolerating therapy well thus far. He is considering radiation therapy for symptom control which is reasonable.  He reports that he is going to touch base with Dr. [name] about this.  If he has any issues regarding this matter, he is encouraged to call us.  His insomnia and pain are better controlled at this time.  He requests a refill on his Percocet which I have provided him today.  He will return in 2 weeks for follow-up and to start cycle 3 of therapy.    Tobacco abuse Smoking cessation education provided.  Chest xray today.  Recommended OTC Delsym for cough.  Hopefully his pain medication will be helpful with this symptom.  Coughing is likely from tobacco abuse.     THERAPY PLAN:  Continue treatment as planned.  He is considering radiation.  He has tolerated therapy well and I think radiation would be helpful  in providing symptomatic relief.  All questions were answered. The patient knows to call the clinic with any problems, questions or concerns. We can certainly see the patient much sooner if necessary.  Patient and plan discussed with Dr. Ancil Linsey and she is in agreement with the aforementioned.   This note is electronically signed by: Robynn Pane 03/22/2014 3:08 PM

## 2014-03-22 NOTE — Assessment & Plan Note (Signed)
Smoking cessation education provided.  Chest xray today.  Recommended OTC Delsym for cough.  Hopefully his pain medication will be helpful with this symptom.  Coughing is likely from tobacco abuse.

## 2014-03-23 NOTE — Progress Notes (Signed)
Patients wife notified

## 2014-03-28 ENCOUNTER — Inpatient Hospital Stay (HOSPITAL_COMMUNITY): Payer: Medicare Other

## 2014-03-28 ENCOUNTER — Ambulatory Visit (HOSPITAL_COMMUNITY): Payer: Medicare Other | Admitting: Hematology & Oncology

## 2014-03-28 ENCOUNTER — Ambulatory Visit (HOSPITAL_COMMUNITY): Payer: Self-pay | Admitting: Hematology & Oncology

## 2014-03-30 ENCOUNTER — Ambulatory Visit (HOSPITAL_COMMUNITY): Payer: Self-pay | Admitting: Psychiatry

## 2014-04-04 ENCOUNTER — Inpatient Hospital Stay (HOSPITAL_COMMUNITY): Payer: Medicare Other

## 2014-04-05 ENCOUNTER — Encounter (HOSPITAL_BASED_OUTPATIENT_CLINIC_OR_DEPARTMENT_OTHER): Payer: Medicare Other

## 2014-04-05 ENCOUNTER — Encounter (HOSPITAL_BASED_OUTPATIENT_CLINIC_OR_DEPARTMENT_OTHER): Payer: Medicare Other | Admitting: Hematology & Oncology

## 2014-04-05 ENCOUNTER — Encounter (HOSPITAL_COMMUNITY): Payer: Self-pay | Admitting: Hematology & Oncology

## 2014-04-05 VITALS — BP 107/70 | HR 74 | Temp 98.8°F | Resp 18 | Wt 130.9 lb

## 2014-04-05 DIAGNOSIS — C3432 Malignant neoplasm of lower lobe, left bronchus or lung: Secondary | ICD-10-CM

## 2014-04-05 DIAGNOSIS — C672 Malignant neoplasm of lateral wall of bladder: Secondary | ICD-10-CM

## 2014-04-05 DIAGNOSIS — Z452 Encounter for adjustment and management of vascular access device: Secondary | ICD-10-CM

## 2014-04-05 DIAGNOSIS — G893 Neoplasm related pain (acute) (chronic): Secondary | ICD-10-CM

## 2014-04-05 DIAGNOSIS — C3492 Malignant neoplasm of unspecified part of left bronchus or lung: Secondary | ICD-10-CM

## 2014-04-05 DIAGNOSIS — C679 Malignant neoplasm of bladder, unspecified: Secondary | ICD-10-CM

## 2014-04-05 DIAGNOSIS — Z720 Tobacco use: Secondary | ICD-10-CM

## 2014-04-05 DIAGNOSIS — F329 Major depressive disorder, single episode, unspecified: Secondary | ICD-10-CM

## 2014-04-05 LAB — COMPREHENSIVE METABOLIC PANEL
ALBUMIN: 3.8 g/dL (ref 3.5–5.2)
ALT: 13 U/L (ref 0–53)
AST: 19 U/L (ref 0–37)
Alkaline Phosphatase: 64 U/L (ref 39–117)
Anion gap: 4 — ABNORMAL LOW (ref 5–15)
BUN: 10 mg/dL (ref 6–23)
CO2: 27 mmol/L (ref 19–32)
CREATININE: 0.88 mg/dL (ref 0.50–1.35)
Calcium: 8.7 mg/dL (ref 8.4–10.5)
Chloride: 106 mmol/L (ref 96–112)
GFR calc Af Amer: >90 mL/min (ref >90)
GFR calc non Af Amer: >90 mL/min (ref >90)
Glucose, Bld: 117 mg/dL — ABNORMAL HIGH (ref 70–99)
Potassium: 3.5 mmol/L (ref 3.5–5.1)
Sodium: 137 mmol/L (ref 135–145)
Total Bilirubin: 0.2 mg/dL — ABNORMAL LOW (ref 0.3–1.2)
Total Protein: 6.4 g/dL (ref 6.0–8.3)

## 2014-04-05 LAB — CBC WITH DIFFERENTIAL/PLATELET
Basophils Absolute: 0 10*3/uL (ref 0.0–0.1)
Basophils Relative: 0 % (ref 0–1)
EOS ABS: 0 10*3/uL (ref 0.0–0.7)
EOS PCT: 1 % (ref 0–5)
HEMATOCRIT: 30.6 % — AB (ref 39.0–52.0)
Hemoglobin: 10.1 g/dL — ABNORMAL LOW (ref 13.0–17.0)
LYMPHS ABS: 1.1 10*3/uL (ref 0.7–4.0)
Lymphocytes Relative: 30 % (ref 12–46)
MCH: 32.2 pg (ref 26.0–34.0)
MCHC: 33 g/dL (ref 30.0–36.0)
MCV: 97.5 fL (ref 78.0–100.0)
MONO ABS: 0.5 10*3/uL (ref 0.1–1.0)
Monocytes Relative: 14 % — ABNORMAL HIGH (ref 3–12)
Neutro Abs: 1.9 10*3/uL (ref 1.7–7.7)
Neutrophils Relative %: 55 % (ref 43–77)
Platelets: 66 10*3/uL — ABNORMAL LOW (ref 150–400)
RBC: 3.14 MIL/uL — AB (ref 4.22–5.81)
RDW: 17 % — ABNORMAL HIGH (ref 11.5–15.5)
WBC: 3.6 10*3/uL — AB (ref 4.0–10.5)

## 2014-04-05 MED ORDER — MIRTAZAPINE 15 MG PO TABS: ORAL_TABLET | ORAL | Status: DC

## 2014-04-05 MED ORDER — HEPARIN SOD (PORK) LOCK FLUSH 100 UNIT/ML IV SOLN
500.0000 [IU] | Freq: Once | INTRAVENOUS | Status: AC
  Administered 2014-04-05: 500 [IU] via INTRAVENOUS
  Filled 2014-04-05: qty 5

## 2014-04-05 MED ORDER — SODIUM CHLORIDE 0.9 % IJ SOLN
10.0000 mL | INTRAMUSCULAR | Status: DC | PRN
  Administered 2014-04-05: 10 mL via INTRAVENOUS
  Filled 2014-04-05: qty 10

## 2014-04-05 NOTE — Patient Instructions (Signed)
[place] at [facility] Discharge Instructions  RECOMMENDATIONS MADE BY THE CONSULTANT AND ANY TEST RESULTS WILL BE SENT TO YOUR REFERRING PHYSICIAN.  Exam and discussion by Dr. Whitney Muse. Will get you scheduled to see Dr. [name] again. Take your Prilosec twice daily on routine basis.  If you are having it to take more often let us know. Can increase your Remeron to 60 mg daily Stop the cough medication. Cannot get chemotherapy today because your platelet count is too low. Need to try to get out more and be more active  Will see you back next week for follow-up and chemotherapy.    Thank you for choosing [place] at [facility] to provide your oncology and hematology care.  To afford each patient quality time with our provider, please arrive at least 15 minutes before your scheduled appointment time.    You need to re-schedule your appointment should you arrive 10 or more minutes late.  We strive to give you quality time with our providers, and arriving late affects you and other patients whose appointments are after yours.  Also, if you no show three or more times for appointments you may be dismissed from the clinic at the providers discretion.     Again, thank you for choosing [facility].  Our hope is that these requests will decrease the amount of time that you wait before being seen by our physicians.       _____________________________________________________________  Should you have questions after your visit to [facility], please contact our office at [phone] between the hours of 8:30 a.m. and 4:30 p.m.  Voicemails left after 4:30 p.m. will not be returned until the following business day.  For prescription refill requests, have your pharmacy contact our office.

## 2014-04-05 NOTE — Progress Notes (Signed)
Brian Sims presented for Portacath access and flush. Proper placement of portacath confirmed by CXR. Portacath located right chest wall accessed with  H 20 needle. Good blood return present. Portacath flushed with 32m NS and 500U/541mHeparin and needle removed intact. Procedure without incident. Patient tolerated procedure well.  Chemotherapy deferred x 1 week. Treatment parameters not met.

## 2014-04-10 NOTE — Assessment & Plan Note (Addendum)
High grade urothelial carcinoma with prostate invasion, currently on systemic chemotherapy consisting of Carboplatin/Gemzar beginning on 02/14/2014 with good tolerability to therapy.  Nice clinical response to therapy as well, but with many non-oncologically related complaints.  Chemotherapy last week was held for thrombocytopenia.  Pre-chemo labs today as ordered: CBC diff, CMET. If labs meet treatment parameters, we will treat tomorrow as planned.  I will ask our scheduler to verify that the patient has been re-referred back to Dr. [name] ([place]) for consideration of radiation therapy.  Return in 3 weeks for follow-up.  Late Entry:  Labs came back.  Patient's ANC is 1.5.  He was held last week for thrombocytopenia.  He will get treated tomorrow as planned.  We will reduce dose of Carboplatin and Gemzar by 20%.  Treatment plan updated.

## 2014-04-10 NOTE — Progress Notes (Signed)
Brian Bogus, MD [address] [place] [address]  Malignant neoplasm of urinary bladder, unspecified site - Plan: CBC with Differential, Comprehensive metabolic panel, heparin lock flush 100 unit/mL, sodium chloride 0.9 % injection 10 mL  CURRENT THERAPY: Carboplatin/Gemzar started on 02/14/2014  INTERVAL HISTORY: Brian Sims 64 y.o. 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     Squamous cell carcinoma of left lung   10/25/2013 Initial Diagnosis T1N0 SCCA of the left lower lobe    Radiation Therapy     Bladder cancer   01/18/2014 Initial Diagnosis 1. Bladder, biopsy, right lateral wall of bladder - UROTHELIAL CARCINOMA IN SITU, NO STROMAL INVASION IDENTIFIED.  3. Prostate, TUR, deep prostatic tissue - HIGH GRADE UROTHELIAL CARCINOMA WITH ASSOCIATED TUMOR NECROSIS, INVOLVING PROSTATIC DUCTS    02/14/2014 -  Chemotherapy Carboplatin/Gemzar   I personally reviewed and went over laboratory results with the patient.  The results are noted within this dictation.  This cycle of chemotherapy was held last week for thrombocytopenia.   Labs today are pending at this time and we will call him today with results in preparation for chemotherapy tomorrow. Hero says, "so when I get here tomorrow, my chemotherapy will be ready?"  I explained that if his labs today meet treatment parameters, we will release the chemotherapy drugs when he arrives at the clinic.  "Well we were told it would be ready for [place] when we get here."  I explained that we have to make sure M[place]che shows up to the clinic tomorrow and does not have any obvious issues before we release the chemotherapy, have it mixed, and then have to waste thousands of dollars because the medicines aren't used.  I said, "Gillie Manners forbid you have a motor vehicle accident today or tomorrow and do not show up for chemotherapy tomorrow as planned."  The patient's wife reports, "Oh, I  was wondering about that."  They understood, by the end of our conversation regarding this matter, why his chemotherapy medications will be released upon his arrival to the clinic.  I told the nurse this conversation and she reports they had a very similar conversation and she told them the same thing prior to my conversation with them.  He reports that he has not heard about an appointment with Dr. [name] for reconsideration of radiation therapy yet.  I will make sure that we have submitted needed information to get him back in with Dr. [name] in [place] for radiation therapy.  His past scans were not helpful with regards to identifying his disease status, and therefore, repeat scans are not justified for restaging.  Dr. [name] may want a repeat cystoscopy prior to radiation to evaluate response to therapy, but I am not sure the utility and whether it will change the role or need for radiation therapy.  A cystoscopy may be helpful to evaluate disease status.  Will defer the need for cystoscopy prior to radiation to Dr. [name].  He is willing to see her again in consultation.  Clinically, he is much improved.  He reports that his pain is a 3/10.  He denies any complaints today.  He looks great, compared to when we started therapy.    Oncologically, he denies any complaints and ROS questioning is negative.   Past Medical History  Diagnosis Date  . COPD (chronic obstructive pulmonary disease)   . Inflammatory bowel disease   . Constipation   .  Lesion of bladder   . Prostate mass   . Dysuria-frequency syndrome     wears depends  . Arthritis   . Shortness of breath   . Depression   . Anxiety   . Kidney stones   . GERD (gastroesophageal reflux disease)   . Cancer 2015    LT lung, bladder and prostate    has IBS (irritable bowel syndrome); Back pain; Heme positive stool; Squamous cell carcinoma of left lung; Bladder cancer; Tobacco abuse; Hematuria; and Neoplasm related pain on his  problem list.     is allergic to lyrica; gabapentin; and morphine and related.  We administered heparin lock flush and sodium chloride.  Past Surgical History  Procedure Laterality Date  . Back surgery    . Left shoulder      arthroscopy  . Rt knee arthroscopy    . Colonoscopy N/A 01/11/2013    Procedure: COLONOSCOPY;  Surgeon: [name], MD;  [facility];  Service: Endoscopy;  Laterality: N/A;  340  . Carpal tunnel release Right   . Transurethral resection of bladder tumor N/A 09/21/2013    Procedure: TRANSURETHRAL RESECTION  PROSTATE  AND BLADDER ;  Surgeon: [name], MD;  Location: [facility];  Service: Urology;  Laterality: N/A;  . Video bronchoscopy with endobronchial navigation Right 10/25/2013    Procedure: VIDEO BRONCHOSCOPY WITH ENDOBRONCHIAL NAVIGATION;  Surgeon: [name], MD;  Location: [place];  Service: Thoracic;  Laterality: Right;  . Transurethral resection of bladder tumor with gyrus (turbt-gyrus) N/A 01/18/2014    Procedure: TRANSURETHRAL RESECTION OF BLADDER TUMOR WITH GYRUS (TURBT-GYRUS);  Surgeon: [name], MD;  Location: [facility];  Service: Urology;  Laterality: N/A;  . Transurethral resection of prostate N/A 01/18/2014    Procedure: TRANSURETHRAL RESECTION OF THE PROSTATE (TURP);  Surgeon: [name], MD;  Location: [facility];  Service: Urology;  Laterality: N/A;    Denies any headaches, dizziness, double vision, fevers, chills, night sweats, nausea, vomiting, diarrhea, constipation, chest pain, heart palpitations, shortness of breath, blood in stool, black tarry stool, urinary pain, urinary burning, urinary frequency, hematuria.   PHYSICAL EXAMINATION  ECOG PERFORMANCE STATUS: 1 - Symptomatic but completely ambulatory  Filed Vitals:   04/11/14 1130  BP: 109/69  Pulse: 71  Temp: 98.6 F (37 C)  Resp: 18    GENERAL:alert, no distress, well nourished, well developed, comfortable, cooperative, smiling and  accompanied by his wife, chronically ill appearing SKIN: skin color, texture, turgor are normal, no rashes or significant lesions HEAD: Normocephalic, No masses, lesions, tenderness or abnormalities EYES: normal, PERRLA, EOMI, Conjunctiva are pink and non-injected EARS: External ears normal OROPHARYNX:lips, buccal mucosa, and tongue normal and mucous membranes are moist  NECK: supple LYMPH:  not examined BREAST:not examined LUNGS: not examined HEART: not examined ABDOMEN:non-tender BACK: Back symmetric, no curvature. EXTREMITIES:less then 2 second capillary refill, no joint deformities, effusion, or inflammation, no skin discoloration  NEURO: alert & oriented x 3 with fluent speech, no focal motor/sensory deficits, gait normal   LABORATORY DATA: CBC    Component Value Date/Time   WBC 3.5* 04/11/2014 1203   RBC 3.49* 04/11/2014 1203   HGB 11.4* 04/11/2014 1203   HCT 34.8* 04/11/2014 1203   PLT 199 04/11/2014 1203   MCV 99.7 04/11/2014 1203   MCH 32.7 04/11/2014 1203   MCHC 32.8 04/11/2014 1203   RDW 18.9* 04/11/2014 1203   LYMPHSABS 1.2 04/11/2014 1203   MONOABS 0.7 04/11/2014 1203  EOSABS 0.0 04/11/2014 1203   BASOSABS 0.0 04/11/2014 1203      Chemistry      Component Value Date/Time   NA 137 04/05/2014 1049   K 3.5 04/05/2014 1049   CL 106 04/05/2014 1049   CO2 27 04/05/2014 1049   BUN 10 04/05/2014 1049   CREATININE 0.88 04/05/2014 1049      Component Value Date/Time   CALCIUM 8.7 04/05/2014 1049   ALKPHOS 64 04/05/2014 1049   AST 19 04/05/2014 1049   ALT 13 04/05/2014 1049   BILITOT 0.2* 04/05/2014 1049      RADIOGRAPHIC STUDIES:  Dg Chest 2 View  03/23/2014   CLINICAL DATA:  Increased cough over the past 2 weeks, known lung malignancy, history of COPD and chronic tobacco use.  EXAM: CHEST  2 VIEW  COMPARISON:  PA and lateral chest x-ray dated January 11, 2014  FINDINGS: The lungs are hyperinflated with hemidiaphragm flattening. There is no focal  infiltrate. A trace of pleural fluid on the right is present and new. There is no pneumothorax. The bony thorax is unremarkable. The heart and pulmonary vascularity are normal. The mediastinum is normal in width. The power port appliance tip projects over the midportion of the SVC.  IMPRESSION: COPD. A trace of pleural fluid has developed on the right. There is no evidence of pneumonia, pulmonary parenchymal masses, or nor other active cardiopulmonary disease.   Electronically Signed   By: David  Martinique   On: 03/23/2014 09:09      ASSESSMENT AND PLAN:  Bladder cancer High grade urothelial carcinoma with prostate invasion, currently on systemic chemotherapy consisting of Carboplatin/Gemzar beginning on 02/14/2014 with good tolerability to therapy.  Nice clinical response to therapy as well, but with many non-oncologically related complaints.  Chemotherapy last week was held for thrombocytopenia.  Pre-chemo labs today as ordered: CBC diff, CMET. If labs meet treatment parameters, we will treat tomorrow as planned.  I will ask our scheduler to verify that the patient has been re-referred back to Dr. [name] ([place]) for consideration of radiation therapy.  Return in 3 weeks for follow-up.    THERAPY PLAN:  Continue with treatment as planned.  No role for restaging scans as they have not been helpful in the past in demonstrating extent of disease.   All questions were answered. The patient knows to call the clinic with any problems, questions or concerns. We can certainly see the patient much sooner if necessary.  Patient and plan discussed with Dr. Ancil Linsey and she is in agreement with the aforementioned.   This note is electronically signed by: Robynn Pane 04/11/2014 12:38 PM

## 2014-04-11 ENCOUNTER — Encounter (HOSPITAL_COMMUNITY): Payer: Self-pay | Admitting: Oncology

## 2014-04-11 ENCOUNTER — Encounter (HOSPITAL_COMMUNITY): Payer: Medicare Other | Attending: Oncology | Admitting: Oncology

## 2014-04-11 ENCOUNTER — Encounter (HOSPITAL_COMMUNITY): Payer: Self-pay | Admitting: Lab

## 2014-04-11 VITALS — BP 109/69 | HR 71 | Temp 98.6°F | Resp 18 | Wt 128.6 lb

## 2014-04-11 DIAGNOSIS — R319 Hematuria, unspecified: Secondary | ICD-10-CM | POA: Diagnosis not present

## 2014-04-11 DIAGNOSIS — C672 Malignant neoplasm of lateral wall of bladder: Secondary | ICD-10-CM

## 2014-04-11 DIAGNOSIS — Z720 Tobacco use: Secondary | ICD-10-CM | POA: Insufficient documentation

## 2014-04-11 DIAGNOSIS — C679 Malignant neoplasm of bladder, unspecified: Secondary | ICD-10-CM | POA: Insufficient documentation

## 2014-04-11 LAB — COMPREHENSIVE METABOLIC PANEL
ALT: 15 U/L (ref 0–53)
AST: 21 U/L (ref 0–37)
Albumin: 3.9 g/dL (ref 3.5–5.2)
Alkaline Phosphatase: 60 U/L (ref 39–117)
Anion gap: 5 (ref 5–15)
BILIRUBIN TOTAL: 0.4 mg/dL (ref 0.3–1.2)
BUN: 9 mg/dL (ref 6–23)
CHLORIDE: 107 mmol/L (ref 96–112)
CO2: 28 mmol/L (ref 19–32)
CREATININE: 0.87 mg/dL (ref 0.50–1.35)
Calcium: 8.9 mg/dL (ref 8.4–10.5)
GFR calc non Af Amer: >90 mL/min (ref >90)
GLUCOSE: 101 mg/dL — AB (ref 70–99)
Potassium: 3.8 mmol/L (ref 3.5–5.1)
Sodium: 140 mmol/L (ref 135–145)
Total Protein: 6.4 g/dL (ref 6.0–8.3)

## 2014-04-11 LAB — CBC WITH DIFFERENTIAL/PLATELET
BASOS ABS: 0 10*3/uL (ref 0.0–0.1)
BASOS PCT: 0 % (ref 0–1)
Eosinophils Absolute: 0 10*3/uL (ref 0.0–0.7)
Eosinophils Relative: 1 % (ref 0–5)
HCT: 34.8 % — ABNORMAL LOW (ref 39.0–52.0)
Hemoglobin: 11.4 g/dL — ABNORMAL LOW (ref 13.0–17.0)
Lymphocytes Relative: 34 % (ref 12–46)
Lymphs Abs: 1.2 10*3/uL (ref 0.7–4.0)
MCH: 32.7 pg (ref 26.0–34.0)
MCHC: 32.8 g/dL (ref 30.0–36.0)
MCV: 99.7 fL (ref 78.0–100.0)
MONO ABS: 0.7 10*3/uL (ref 0.1–1.0)
Monocytes Relative: 21 % — ABNORMAL HIGH (ref 3–12)
Neutro Abs: 1.5 10*3/uL — ABNORMAL LOW (ref 1.7–7.7)
Neutrophils Relative %: 44 % (ref 43–77)
Platelets: 199 10*3/uL (ref 150–400)
RBC: 3.49 MIL/uL — ABNORMAL LOW (ref 4.22–5.81)
RDW: 18.9 % — AB (ref 11.5–15.5)
WBC: 3.5 10*3/uL — ABNORMAL LOW (ref 4.0–10.5)

## 2014-04-11 MED ORDER — SODIUM CHLORIDE 0.9 % IJ SOLN
10.0000 mL | INTRAMUSCULAR | Status: DC | PRN
  Administered 2014-04-11: 10 mL via INTRAVENOUS
  Filled 2014-04-11: qty 10

## 2014-04-11 MED ORDER — HEPARIN SOD (PORK) LOCK FLUSH 100 UNIT/ML IV SOLN
INTRAVENOUS | Status: AC
  Filled 2014-04-11: qty 5

## 2014-04-11 MED ORDER — HEPARIN SOD (PORK) LOCK FLUSH 100 UNIT/ML IV SOLN
500.0000 [IU] | Freq: Once | INTRAVENOUS | Status: AC
  Administered 2014-04-11: 500 [IU] via INTRAVENOUS

## 2014-04-11 NOTE — Patient Instructions (Addendum)
..  [place] at [facility] Discharge Instructions  RECOMMENDATIONS MADE BY THE CONSULTANT AND ANY TEST RESULTS WILL BE SENT TO YOUR REFERRING PHYSICIAN.  We will call you with lab results  If you do not hear from [place] call [phone] and ask for Olivia Mackie or Christyne Mccain  Thank you for choosing [place] at [facility] to provide your oncology and hematology care.  To afford each patient quality time with our provider, please arrive at least 15 minutes before your scheduled appointment time.    You need to re-schedule your appointment should you arrive 10 or more minutes late.  We strive to give you quality time with our providers, and arriving late affects you and other patients whose appointments are after yours.  Also, if you no show three or more times for appointments you may be dismissed from the clinic at the providers discretion.     Again, thank you for choosing [facility].  Our hope is that these requests will decrease the amount of time that you wait before being seen by our physicians.       _____________________________________________________________  Should you have questions after your visit to [facility], please contact our office at [phone] between the hours of 8:30 a.m. and 4:30 p.m.  Voicemails left after 4:30 p.m. will not be returned until the following business day.  For prescription refill requests, have your pharmacy contact our office.

## 2014-04-11 NOTE — Progress Notes (Signed)
Referral sent to [facility].  Dr [name] to revaluate for Rad Onc.3/2

## 2014-04-12 ENCOUNTER — Ambulatory Visit (HOSPITAL_COMMUNITY): Payer: Self-pay | Admitting: Oncology

## 2014-04-12 ENCOUNTER — Inpatient Hospital Stay (HOSPITAL_COMMUNITY): Payer: Self-pay

## 2014-04-12 ENCOUNTER — Encounter (HOSPITAL_BASED_OUTPATIENT_CLINIC_OR_DEPARTMENT_OTHER): Payer: Medicare Other

## 2014-04-12 DIAGNOSIS — C672 Malignant neoplasm of lateral wall of bladder: Secondary | ICD-10-CM

## 2014-04-12 DIAGNOSIS — C679 Malignant neoplasm of bladder, unspecified: Secondary | ICD-10-CM

## 2014-04-12 DIAGNOSIS — Z5111 Encounter for antineoplastic chemotherapy: Secondary | ICD-10-CM

## 2014-04-12 MED ORDER — DEXAMETHASONE SODIUM PHOSPHATE 10 MG/ML IJ SOLN: 20.0000 mg | Freq: Once | INTRAMUSCULAR | Status: DC

## 2014-04-12 MED ORDER — SODIUM CHLORIDE 0.9 % IV SOLN
192.0000 mg | Freq: Once | INTRAVENOUS | Status: AC
  Administered 2014-04-12: 190 mg via INTRAVENOUS
  Filled 2014-04-12: qty 19

## 2014-04-12 MED ORDER — HEPARIN SOD (PORK) LOCK FLUSH 100 UNIT/ML IV SOLN
500.0000 [IU] | Freq: Once | INTRAVENOUS | Status: DC | PRN
  Filled 2014-04-12: qty 5

## 2014-04-12 MED ORDER — SODIUM CHLORIDE 0.9 % IV SOLN: 16.0000 mg | Freq: Once | INTRAVENOUS | Status: DC

## 2014-04-12 MED ORDER — SODIUM CHLORIDE 0.9 % IV SOLN
640.0000 mg/m2 | Freq: Once | INTRAVENOUS | Status: AC
  Administered 2014-04-12: 1064 mg via INTRAVENOUS
  Filled 2014-04-12: qty 22.72

## 2014-04-12 MED ORDER — ONDANSETRON HCL 40 MG/20ML IJ SOLN
Freq: Once | INTRAMUSCULAR | Status: AC
  Administered 2014-04-12: 8 mg via INTRAVENOUS
  Filled 2014-04-12: qty 8

## 2014-04-12 MED ORDER — SODIUM CHLORIDE 0.9 % IV SOLN
Freq: Once | INTRAVENOUS | Status: AC
  Administered 2014-04-12: 12:00:00 via INTRAVENOUS

## 2014-04-12 MED ORDER — SODIUM CHLORIDE 0.9 % IJ SOLN
10.0000 mL | INTRAMUSCULAR | Status: DC | PRN
  Administered 2014-04-12: 10 mL
  Filled 2014-04-12: qty 10

## 2014-04-12 NOTE — Progress Notes (Signed)
Brian Sims Tolerated chemotherapy well.  Discharged via wheelchair

## 2014-04-12 NOTE — Patient Instructions (Signed)
[facility] Discharge Instructions for Patients Receiving Chemotherapy  Today you received the following chemotherapy agents carbo/gemzar Please call the clinic if you have any questions or concerns  To help prevent nausea and vomiting after your treatment, we encourage you to take your nausea medication  If you develop nausea and vomiting that is not controlled by your nausea medication, call the clinic. If it is after clinic hours your family physician or the after hours number for the clinic or go to the Emergency Department.   BELOW ARE SYMPTOMS THAT SHOULD BE REPORTED IMMEDIATELY:  *FEVER GREATER THAN 101.0 F  *CHILLS WITH OR WITHOUT FEVER  NAUSEA AND VOMITING THAT IS NOT CONTROLLED WITH YOUR NAUSEA MEDICATION  *UNUSUAL SHORTNESS OF BREATH  *UNUSUAL BRUISING OR BLEEDING  TENDERNESS IN MOUTH AND THROAT WITH OR WITHOUT PRESENCE OF ULCERS  *URINARY PROBLEMS  *BOWEL PROBLEMS  UNUSUAL RASH Items with * indicate a potential emergency and should be followed up as soon as possible.  One of the nurses will contact you 24 hours after your treatment. Please let the nurse know about any problems that you may have experienced. Feel free to call the clinic you have any questions or concerns. The clinic phone number is [phone].   I have been informed and understand all the instructions given to me. I know to contact the clinic, my physician, or go to the Emergency Department if any problems should occur. I do not have any questions at this time, but understand that I may call the clinic during office hours or the Patient Navigator at [phone] should I have any questions or need assistance in obtaining follow up care.

## 2014-04-19 ENCOUNTER — Encounter (HOSPITAL_BASED_OUTPATIENT_CLINIC_OR_DEPARTMENT_OTHER): Payer: Medicare Other

## 2014-04-19 ENCOUNTER — Encounter (HOSPITAL_COMMUNITY): Payer: Self-pay

## 2014-04-19 DIAGNOSIS — C679 Malignant neoplasm of bladder, unspecified: Secondary | ICD-10-CM | POA: Diagnosis not present

## 2014-04-19 DIAGNOSIS — Z452 Encounter for adjustment and management of vascular access device: Secondary | ICD-10-CM

## 2014-04-19 LAB — CBC WITH DIFFERENTIAL/PLATELET
BASOS PCT: 0 % (ref 0–1)
Basophils Absolute: 0 10*3/uL (ref 0.0–0.1)
EOS ABS: 0 10*3/uL (ref 0.0–0.7)
EOS PCT: 0 % (ref 0–5)
HCT: 33.7 % — ABNORMAL LOW (ref 39.0–52.0)
HEMOGLOBIN: 11.2 g/dL — AB (ref 13.0–17.0)
Lymphocytes Relative: 44 % (ref 12–46)
Lymphs Abs: 1 10*3/uL (ref 0.7–4.0)
MCH: 33.1 pg (ref 26.0–34.0)
MCHC: 33.2 g/dL (ref 30.0–36.0)
MCV: 99.7 fL (ref 78.0–100.0)
MONO ABS: 0.1 10*3/uL (ref 0.1–1.0)
Monocytes Relative: 4 % (ref 3–12)
NEUTROS PCT: 52 % (ref 43–77)
Neutro Abs: 1.1 10*3/uL — ABNORMAL LOW (ref 1.7–7.7)
PLATELETS: 128 10*3/uL — AB (ref 150–400)
RBC: 3.38 MIL/uL — ABNORMAL LOW (ref 4.22–5.81)
RDW: 16.3 % — ABNORMAL HIGH (ref 11.5–15.5)
WBC: 2.2 10*3/uL — AB (ref 4.0–10.5)

## 2014-04-19 LAB — COMPREHENSIVE METABOLIC PANEL
ALK PHOS: 53 U/L (ref 39–117)
ALT: 15 U/L (ref 0–53)
AST: 22 U/L (ref 0–37)
Albumin: 3.9 g/dL (ref 3.5–5.2)
Anion gap: 7 (ref 5–15)
BUN: 12 mg/dL (ref 6–23)
CALCIUM: 9 mg/dL (ref 8.4–10.5)
CHLORIDE: 105 mmol/L (ref 96–112)
CO2: 26 mmol/L (ref 19–32)
CREATININE: 0.85 mg/dL (ref 0.50–1.35)
GFR calc Af Amer: >90 mL/min (ref >90)
Glucose, Bld: 105 mg/dL — ABNORMAL HIGH (ref 70–99)
POTASSIUM: 3.9 mmol/L (ref 3.5–5.1)
SODIUM: 138 mmol/L (ref 135–145)
Total Bilirubin: 0.5 mg/dL (ref 0.3–1.2)
Total Protein: 6.6 g/dL (ref 6.0–8.3)

## 2014-04-19 MED ORDER — SODIUM CHLORIDE 0.9 % IJ SOLN
10.0000 mL | Freq: Once | INTRAMUSCULAR | Status: AC
  Administered 2014-04-19: 10 mL via INTRAVENOUS

## 2014-04-19 MED ORDER — HEPARIN SOD (PORK) LOCK FLUSH 100 UNIT/ML IV SOLN
500.0000 [IU] | Freq: Once | INTRAVENOUS | Status: AC
  Administered 2014-04-19: 500 [IU] via INTRAVENOUS

## 2014-04-19 MED ORDER — HEPARIN SOD (PORK) LOCK FLUSH 100 UNIT/ML IV SOLN
INTRAVENOUS | Status: AC
  Filled 2014-04-19: qty 5

## 2014-04-19 NOTE — Patient Instructions (Addendum)
[place] at [facility] Discharge Instructions  RECOMMENDATIONS MADE BY THE CONSULTANT AND ANY TEST RESULTS WILL BE SENT TO YOUR REFERRING PHYSICIAN.  Chemotherapy held today due to low white blood cell count. We will schedule for chemotherapy next week.  Thank you for choosing [place] at [facility] to provide your oncology and hematology care.  To afford each patient quality time with our provider, please arrive at least 15 minutes before your scheduled appointment time.    You need to re-schedule your appointment should you arrive 10 or more minutes late.  We strive to give you quality time with our providers, and arriving late affects you and other patients whose appointments are after yours.  Also, if you no show three or more times for appointments you may be dismissed from the clinic at the providers discretion.     Again, thank you for choosing [facility].  Our hope is that these requests will decrease the amount of time that you wait before being seen by our physicians.       _____________________________________________________________  Should you have questions after your visit to [facility], please contact our office at [phone] between the hours of 8:30 a.m. and 4:30 p.m.  Voicemails left after 4:30 p.m. will not be returned until the following business day.  For prescription refill requests, have your pharmacy contact our office.

## 2014-04-19 NOTE — Progress Notes (Signed)
ANC 1.1. Patient given the choice to be treated with chemotherapy today and start neulasta injections tomorrow or delay chemo x 1 week. Patient decided to postpone chemo x 1 week. Return as scheduled.Brian Sims presented for Portacath access and flush. Proper placement of portacath confirmed by CXR. Portacath located right chest wall accessed with  H 20 needle. Good blood return present. Portacath flushed with 14ml NS and 500U/65ml Heparin and needle removed intact. Procedure without incident. Patient tolerated procedure well.

## 2014-04-25 ENCOUNTER — Encounter (HOSPITAL_COMMUNITY): Payer: Medicare Other

## 2014-04-25 ENCOUNTER — Encounter (HOSPITAL_COMMUNITY): Payer: Self-pay | Admitting: Hematology & Oncology

## 2014-04-25 ENCOUNTER — Encounter (HOSPITAL_COMMUNITY): Payer: Self-pay

## 2014-04-25 DIAGNOSIS — C679 Malignant neoplasm of bladder, unspecified: Secondary | ICD-10-CM | POA: Diagnosis not present

## 2014-04-25 LAB — CBC WITH DIFFERENTIAL/PLATELET
Basophils Absolute: 0 10*3/uL (ref 0.0–0.1)
Basophils Relative: 0 % (ref 0–1)
EOS PCT: 1 % (ref 0–5)
Eosinophils Absolute: 0.1 10*3/uL (ref 0.0–0.7)
HEMATOCRIT: 35.8 % — AB (ref 39.0–52.0)
Hemoglobin: 11.8 g/dL — ABNORMAL LOW (ref 13.0–17.0)
LYMPHS PCT: 22 % (ref 12–46)
Lymphs Abs: 1.1 10*3/uL (ref 0.7–4.0)
MCH: 33.4 pg (ref 26.0–34.0)
MCHC: 33 g/dL (ref 30.0–36.0)
MCV: 101.4 fL — AB (ref 78.0–100.0)
MONO ABS: 0.4 10*3/uL (ref 0.1–1.0)
Monocytes Relative: 8 % (ref 3–12)
Neutro Abs: 3.5 10*3/uL (ref 1.7–7.7)
Neutrophils Relative %: 68 % (ref 43–77)
Platelets: 77 10*3/uL — ABNORMAL LOW (ref 150–400)
RBC: 3.53 MIL/uL — ABNORMAL LOW (ref 4.22–5.81)
RDW: 18.4 % — ABNORMAL HIGH (ref 11.5–15.5)
SMEAR REVIEW: DECREASED
WBC: 5.1 10*3/uL (ref 4.0–10.5)

## 2014-04-25 LAB — COMPREHENSIVE METABOLIC PANEL
ALK PHOS: 61 U/L (ref 39–117)
ALT: 12 U/L (ref 0–53)
AST: 20 U/L (ref 0–37)
Albumin: 3.9 g/dL (ref 3.5–5.2)
Anion gap: 4 — ABNORMAL LOW (ref 5–15)
BILIRUBIN TOTAL: 0.4 mg/dL (ref 0.3–1.2)
BUN: 11 mg/dL (ref 6–23)
CHLORIDE: 107 mmol/L (ref 96–112)
CO2: 28 mmol/L (ref 19–32)
CREATININE: 0.83 mg/dL (ref 0.50–1.35)
Calcium: 8.9 mg/dL (ref 8.4–10.5)
GLUCOSE: 111 mg/dL — AB (ref 70–99)
Potassium: 3.7 mmol/L (ref 3.5–5.1)
SODIUM: 139 mmol/L (ref 135–145)
Total Protein: 6.5 g/dL (ref 6.0–8.3)

## 2014-04-25 MED ORDER — HEPARIN SOD (PORK) LOCK FLUSH 100 UNIT/ML IV SOLN
INTRAVENOUS | Status: AC
  Filled 2014-04-25: qty 5

## 2014-04-25 MED ORDER — SODIUM CHLORIDE 0.9 % IJ SOLN
10.0000 mL | INTRAMUSCULAR | Status: DC | PRN
  Administered 2014-04-25: 10 mL via INTRAVENOUS
  Filled 2014-04-25: qty 10

## 2014-04-25 MED ORDER — HEPARIN SOD (PORK) LOCK FLUSH 100 UNIT/ML IV SOLN
500.0000 [IU] | Freq: Once | INTRAVENOUS | Status: AC
  Administered 2014-04-25: 500 [IU] via INTRAVENOUS

## 2014-04-25 NOTE — Patient Instructions (Signed)
[facility] Discharge Instructions for Patients Receiving Chemotherapy  Today you received the following chemotherapy agents  We held your chemotherapy today because your platelets were low.  Return next week to see the doctor and for chemotherapy appt.  Please call the clinic if you have any questions or concerns.  To help prevent nausea and vomiting after your treatment, we encourage you to take your nausea medication    If you develop nausea and vomiting that is not controlled by your nausea medication, call the clinic. If it is after clinic hours your family physician or the after hours number for the clinic or go to the Emergency Department.   BELOW ARE SYMPTOMS THAT SHOULD BE REPORTED IMMEDIATELY:  *FEVER GREATER THAN 101.0 F  *CHILLS WITH OR WITHOUT FEVER  NAUSEA AND VOMITING THAT IS NOT CONTROLLED WITH YOUR NAUSEA MEDICATION  *UNUSUAL SHORTNESS OF BREATH  *UNUSUAL BRUISING OR BLEEDING  TENDERNESS IN MOUTH AND THROAT WITH OR WITHOUT PRESENCE OF ULCERS  *URINARY PROBLEMS  *BOWEL PROBLEMS  UNUSUAL RASH Items with * indicate a potential emergency and should be followed up as soon as possible.  One of the nurses will contact you 24 hours after your treatment. Please let the nurse know about any problems that you may have experienced. Feel free to call the clinic you have any questions or concerns. The clinic phone number is [phone].   I have been informed and understand all the instructions given to me. I know to contact the clinic, my physician, or go to the Emergency Department if any problems should occur. I do not have any questions at this time, but understand that I may call the clinic during office hours or the Patient Navigator at [phone] should I have any questions or need assistance in obtaining follow up care.

## 2014-04-25 NOTE — Assessment & Plan Note (Signed)
Continues to be an issue although realistically compared to where we started he is improved. He is not taking any long acting pain medication as he feels it interferes with his sleep.

## 2014-04-25 NOTE — Assessment & Plan Note (Addendum)
64 year old male with urothelial carcinoma of the bladder with prostate invasion. He is receiving chemotherapy with carboplatin and Gemzar and in regards to chemotherapy side effects he has had few. We will proceed forward with treatment. They are inquiring whether or not he is a candidate for radiation and I recommended referring him to Dr. [name] for additional discussion. In regards to monitoring his disease, imaging studies are not very helpful and we can consider referring him back to urology at some point for a cystoscopy if needed.  His platelets are low today and therefore chemotherapy will be held. We will dose reduce and plan on treating him at his next cycle, counts permitting

## 2014-04-25 NOTE — Progress Notes (Signed)
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 For the most part she is done fairly well with chemotherapy. The vast majority of complaints continue to be issues I believe revolve around depression. He is had an appointment with behavioral health but did not go. He tends to take his medications as he wishes and not according to prescribed directions. Today,  he is inquiring about Remeron. He has taken it in the past and noted that he "felt better" . It also improved his appetite. He is finally taking trazodone as prescribed, 2 tablets at night and admits he is sleeping more. He still complains of pelvic pain although he states it is improved. He and his wife are inquiring about the possibility of radiation therapy.    Squamous cell carcinoma of left lung   10/25/2013 Initial Diagnosis T1N0 SCCA of the left lower lobe    Radiation Therapy     Bladder cancer   01/18/2014 Initial Diagnosis 1. Bladder, biopsy, right lateral wall of bladder - UROTHELIAL CARCINOMA IN SITU, NO STROMAL INVASION IDENTIFIED.  3. Prostate, TUR, deep prostatic tissue - HIGH GRADE UROTHELIAL CARCINOMA WITH ASSOCIATED TUMOR NECROSIS, INVOLVING PROSTATIC DUCTS    02/14/2014 -  Chemotherapy Carboplatin/Gemzar   He reports that his pain is better controlled.  He is taking short-acting pain medication and requests a refill, which I will do.  He has a number of non-oncologic complaints including a cough.  He continues to smoke and this is likely the cause.  He denies any sputum production.  He denies any  fevers or chills.  We will perform a chest xray and I have recommended OTC Delsym.  If this is ineffective, he is to call us back.  Hopefully his pain medication will help with cough as well.   He also complains about the length of time the lab takes to get results back.   He is reminded that we do not have a dedicated lab and the ED always has priority over [place].  Therefore, depending on how busy the ED is, is usually reflected in the time it requires to get our labs back.  He was offered lab appointments the day prior to therapy but "that won't work because it takes me two days to get ready for one day."  He has a number of radiation questions and these are answered to the patient's satisfaction.  A number of them I have deferred to Dr. [name] as they are very specific questions.    He is sleeping better with Trazodone as prescribed.   Past Medical History  Diagnosis Date  . COPD (chronic obstructive pulmonary disease)   . Inflammatory bowel disease   . Constipation   . Lesion of bladder   . Prostate mass   . Dysuria-frequency syndrome     wears depends  . Arthritis   . Shortness of breath   . Depression   . Anxiety   . Kidney stones   . GERD (gastroesophageal reflux disease)   . Cancer 2015    LT lung, bladder and prostate    has IBS (  irritable bowel syndrome); Back pain; Heme positive stool; Squamous cell carcinoma of left lung; Bladder cancer; Tobacco abuse; Hematuria; and Neoplasm related pain on his problem list.     is allergic to lyrica; gabapentin; and morphine and related.  We administered heparin lock flush.  Past Surgical History  Procedure Laterality Date  . Back surgery    . Left shoulder      arthroscopy  . Rt knee arthroscopy    . Colonoscopy N/A 01/11/2013    Procedure: COLONOSCOPY;  Surgeon: [name], MD;  [facility];  Service: Endoscopy;  Laterality: N/A;  340  . Carpal tunnel release Right   . Transurethral resection of bladder tumor  N/A 09/21/2013    Procedure: TRANSURETHRAL RESECTION  PROSTATE  AND BLADDER ;  Surgeon: [name], MD;  [facility];  Service: Urology;  Laterality: N/A;  . Video bronchoscopy with endobronchial navigation Right 10/25/2013    Procedure: VIDEO BRONCHOSCOPY WITH ENDOBRONCHIAL NAVIGATION;  Surgeon: [name], MD;  Location: [place];  Service: Thoracic;  Laterality: Right;  . Transurethral resection of bladder tumor with gyrus (turbt-gyrus) N/A 01/18/2014    Procedure: TRANSURETHRAL RESECTION OF BLADDER TUMOR WITH GYRUS (TURBT-GYRUS);  Surgeon: [name], MD;  Location: [facility];  Service: Urology;  Laterality: N/A;  . Transurethral resection of prostate N/A 01/18/2014    Procedure: TRANSURETHRAL RESECTION OF THE PROSTATE (TURP);  Surgeon: [name], MD;  Location: [facility];  Service: Urology;  Laterality: N/A;    Denies any headaches, dizziness, double vision, fevers, chills, night sweats, nausea, vomiting, diarrhea, constipation, chest pain, heart palpitations, shortness of breath, blood in stool, black tarry stool, urinary pain, urinary burning, urinary frequency, hematuria.   PHYSICAL EXAMINATION  ECOG PERFORMANCE STATUS: 1 - Symptomatic but completely ambulatory  Filed Vitals:   04/05/14 1056  BP: 107/70  Pulse: 74  Temp: 98.8 F (37.1 C)  Resp: 18    GENERAL:alert, no distress, comfortable, cooperative and chronically ill appearing, accompanied by wife and son. SKIN: skin color, texture, turgor are normal, no rashes or significant lesions HEAD: Normocephalic, No masses, lesions, tenderness or abnormalities EYES: normal, PERRLA EARS: External ears normal OROPHARYNX:lips, buccal mucosa, and tongue normal and mucous membranes are moist  NECK: supple, trachea midline LYMPH: no palpable adenopathy in the neck or supraclavicular regions BREAST:not examined LUNGS: decreased breath sounds HEART: regular rate & rhythm, no murmurs and no  gallops ABDOMEN:abdomen soft and normal bowel sounds BACK: Back symmetric, no curvature. EXTREMITIES:less then 2 second capillary refill, no joint deformities, effusion, or inflammation, no skin discoloration, no cyanosis  NEURO: alert & oriented x 3 with fluent speech, no focal motor/sensory deficits   LABORATORY DATA: CBC Results for GEVORG, BRUM (MRN [id number]) as of 04/25/2014 08:24  Ref. Range 04/05/2014 10:49  Sodium Latest Range: 135-145 mmol/L 137  Potassium Latest Range: 3.5-5.1 mmol/L 3.5  Chloride Latest Range: 96-112 mmol/L 106  CO2 Latest Range: 19-32 mmol/L 27  BUN Latest Range: 6-23 mg/dL 10  Creatinine Latest Range: 0.50-1.35 mg/dL 0.88  Calcium Latest Range: 8.4-10.5 mg/dL 8.7  GFR calc non Af Amer Latest Range: >90 mL/min >90  GFR calc Af Amer Latest Range: >90 mL/min >90  Glucose Latest Range: 70-99 mg/dL 117 (H)  Anion gap Latest Range: 5-15  4 (L)  Alkaline Phosphatase Latest Range: 39-117 U/L 64  Albumin Latest Range: 3.5-5.2 g/dL 3.8  AST Latest Range: 0-37 U/L 19  ALT Latest Range: 0-53 U/L 13  Total Protein Latest Range: 6.0-8.3 g/dL 6.4  Total Bilirubin Latest Range: 0.3-1.2 mg/dL 0.2 (L)  WBC Latest Range: 4.0-10.5 K/uL 3.6 (L)  RBC Latest Range: 4.22-5.81 MIL/uL 3.14 (L)  Hemoglobin Latest Range: 13.0-17.0 g/dL 10.1 (L)  HCT Latest Range: 39.0-52.0 % 30.6 (L)  MCV Latest Range: 78.0-100.0 fL 97.5  MCH Latest Range: 26.0-34.0 pg 32.2  MCHC Latest Range: 30.0-36.0 g/dL 33.0  RDW Latest Range: 11.5-15.5 % 17.0 (H)  Platelets Latest Range: 150-400 K/uL 66 (L)  Neutrophils Relative % Latest Range: 43-77 % 55  Lymphocytes Relative Latest Range: 12-46 % 30  Monocytes Relative Latest Range: 3-12 % 14 (H)  Eosinophils Relative Latest Range: 0-5 % 1  Basophils Relative Latest Range: 0-1 % 0  NEUT# Latest Range: 1.7-7.7 K/uL 1.9  Lymphocytes Absolute Latest Range: 0.7-4.0 K/uL 1.1  Monocytes Absolute Latest Range: 0.1-1.0 K/uL 0.5  Eosinophils Absolute  Latest Range: 0.0-0.7 K/uL 0.0  Basophils Absolute Latest Range: 0.0-0.1 K/uL 0.0       ASSESSMENT AND PLAN:   Bladder cancer 64 year old male with urothelial carcinoma of the bladder with prostate invasion. He is receiving chemotherapy with carboplatin and Gemzar and in regards to chemotherapy side effects he has had few. We will proceed forward with treatment. They are inquiring whether or not he is a candidate for radiation and I recommended referring him to Dr. [name] for additional discussion. In regards to monitoring his disease, imaging studies are not very helpful and we can consider referring him back to urology at some point for a cystoscopy if needed.  His platelets are low today and therefore chemotherapy will be held. We will dose reduce and plan on treating him at his next cycle, counts permitting   Neoplasm related pain Continues to be an issue although realistically compared to where we started he is improved. He is not taking any long acting pain medication as he feels it interferes with his sleep.   Squamous cell carcinoma of left lung He received definitive radiation with Dr. [name]. Imaging studies will continue to be ordered by radiation oncology for ongoing surveillance.   Depression I believe this is a significant component of many of his problems. He is finally taking his trazodone as prescribed and is sleeping better. I did prescribe him Remeron. He had an appointment with behavioral health but did not go. I again encouraged him to please keep the appointments we try to schedule for him. We will continue to work with this issue moving forward.  All questions were answered. The patient knows to call the clinic with any problems, questions or concerns. We can certainly see the patient much sooner if necessary.  This note is electronically signed by: Molli Hazard 04/25/2014 8:31 AM

## 2014-04-25 NOTE — Assessment & Plan Note (Signed)
He received definitive radiation with Dr. [name]. Imaging studies will continue to be ordered by radiation oncology for ongoing surveillance.

## 2014-04-25 NOTE — Progress Notes (Signed)
Chemotherapy being held because of low platelets

## 2014-04-30 ENCOUNTER — Telehealth: Payer: Self-pay

## 2014-04-30 NOTE — Telephone Encounter (Signed)
Patient called to inquire about order for chest xray.Informed patient I will ask doctor about order and return his call on 05/01/14.In basket sent to [facility].

## 2014-05-03 ENCOUNTER — Encounter (HOSPITAL_COMMUNITY): Payer: Self-pay | Admitting: Hematology & Oncology

## 2014-05-03 ENCOUNTER — Encounter (HOSPITAL_BASED_OUTPATIENT_CLINIC_OR_DEPARTMENT_OTHER): Payer: Medicare Other

## 2014-05-03 ENCOUNTER — Other Ambulatory Visit (HOSPITAL_COMMUNITY): Payer: Self-pay | Admitting: Hematology & Oncology

## 2014-05-03 ENCOUNTER — Encounter (HOSPITAL_BASED_OUTPATIENT_CLINIC_OR_DEPARTMENT_OTHER): Payer: Medicare Other | Admitting: Hematology & Oncology

## 2014-05-03 VITALS — BP 117/74 | HR 79 | Temp 98.5°F | Resp 18 | Wt 128.3 lb

## 2014-05-03 DIAGNOSIS — C3432 Malignant neoplasm of lower lobe, left bronchus or lung: Secondary | ICD-10-CM

## 2014-05-03 DIAGNOSIS — C672 Malignant neoplasm of lateral wall of bladder: Secondary | ICD-10-CM

## 2014-05-03 DIAGNOSIS — C3492 Malignant neoplasm of unspecified part of left bronchus or lung: Secondary | ICD-10-CM

## 2014-05-03 DIAGNOSIS — Z5111 Encounter for antineoplastic chemotherapy: Secondary | ICD-10-CM | POA: Diagnosis not present

## 2014-05-03 DIAGNOSIS — F329 Major depressive disorder, single episode, unspecified: Secondary | ICD-10-CM | POA: Diagnosis not present

## 2014-05-03 DIAGNOSIS — C679 Malignant neoplasm of bladder, unspecified: Secondary | ICD-10-CM | POA: Diagnosis not present

## 2014-05-03 DIAGNOSIS — Z720 Tobacco use: Secondary | ICD-10-CM

## 2014-05-03 LAB — CBC WITH DIFFERENTIAL/PLATELET
BASOS ABS: 0 10*3/uL (ref 0.0–0.1)
Basophils Relative: 1 % (ref 0–1)
Eosinophils Absolute: 0.1 10*3/uL (ref 0.0–0.7)
Eosinophils Relative: 1 % (ref 0–5)
HCT: 37.7 % — ABNORMAL LOW (ref 39.0–52.0)
Hemoglobin: 12.4 g/dL — ABNORMAL LOW (ref 13.0–17.0)
Lymphocytes Relative: 31 % (ref 12–46)
Lymphs Abs: 1.4 10*3/uL (ref 0.7–4.0)
MCH: 33.8 pg (ref 26.0–34.0)
MCHC: 32.9 g/dL (ref 30.0–36.0)
MCV: 102.7 fL — ABNORMAL HIGH (ref 78.0–100.0)
Monocytes Absolute: 0.7 10*3/uL (ref 0.1–1.0)
Monocytes Relative: 16 % — ABNORMAL HIGH (ref 3–12)
NEUTROS ABS: 2.3 10*3/uL (ref 1.7–7.7)
Neutrophils Relative %: 51 % (ref 43–77)
PLATELETS: 163 10*3/uL (ref 150–400)
RBC: 3.67 MIL/uL — AB (ref 4.22–5.81)
RDW: 17.9 % — AB (ref 11.5–15.5)
WBC: 4.4 10*3/uL (ref 4.0–10.5)

## 2014-05-03 LAB — COMPREHENSIVE METABOLIC PANEL
ALT: 11 U/L (ref 0–53)
AST: 17 U/L (ref 0–37)
Albumin: 3.9 g/dL (ref 3.5–5.2)
Alkaline Phosphatase: 62 U/L (ref 39–117)
Anion gap: 6 (ref 5–15)
BILIRUBIN TOTAL: 0.4 mg/dL (ref 0.3–1.2)
BUN: 10 mg/dL (ref 6–23)
CALCIUM: 8.7 mg/dL (ref 8.4–10.5)
CHLORIDE: 104 mmol/L (ref 96–112)
CO2: 27 mmol/L (ref 19–32)
Creatinine, Ser: 0.86 mg/dL (ref 0.50–1.35)
GFR calc non Af Amer: >90 mL/min (ref >90)
GLUCOSE: 106 mg/dL — AB (ref 70–99)
Potassium: 3.5 mmol/L (ref 3.5–5.1)
SODIUM: 137 mmol/L (ref 135–145)
Total Protein: 6.6 g/dL (ref 6.0–8.3)

## 2014-05-03 MED ORDER — SODIUM CHLORIDE 0.9 % IV SOLN
Freq: Once | INTRAVENOUS | Status: AC
  Administered 2014-05-03: 16 mg via INTRAVENOUS
  Filled 2014-05-03: qty 8

## 2014-05-03 MED ORDER — TAMSULOSIN HCL 0.4 MG PO CAPS: 0.4000 mg | ORAL_CAPSULE | Freq: Every day | ORAL | Status: DC

## 2014-05-03 MED ORDER — SODIUM CHLORIDE 0.9 % IV SOLN: 16.0000 mg | Freq: Once | INTRAVENOUS | Status: DC

## 2014-05-03 MED ORDER — SODIUM CHLORIDE 0.9 % IV SOLN
190.0000 mg | Freq: Once | INTRAVENOUS | Status: AC
  Administered 2014-05-03: 190 mg via INTRAVENOUS
  Filled 2014-05-03: qty 19

## 2014-05-03 MED ORDER — SODIUM CHLORIDE 0.9 % IV SOLN
Freq: Once | INTRAVENOUS | Status: AC
  Administered 2014-05-03: 13:00:00 via INTRAVENOUS

## 2014-05-03 MED ORDER — SODIUM CHLORIDE 0.9 % IJ SOLN
10.0000 mL | INTRAMUSCULAR | Status: DC | PRN
  Administered 2014-05-03: 10 mL
  Filled 2014-05-03: qty 10

## 2014-05-03 MED ORDER — ONDANSETRON HCL 8 MG PO TABS: 8.0000 mg | ORAL_TABLET | Freq: Three times a day (TID) | ORAL | Status: AC | PRN

## 2014-05-03 MED ORDER — DEXAMETHASONE SODIUM PHOSPHATE 10 MG/ML IJ SOLN: 20.0000 mg | Freq: Once | INTRAMUSCULAR | Status: DC

## 2014-05-03 MED ORDER — OXYCODONE-ACETAMINOPHEN 10-325 MG PO TABS: 1.0000 | ORAL_TABLET | ORAL | Status: DC | PRN

## 2014-05-03 MED ORDER — HEPARIN SOD (PORK) LOCK FLUSH 100 UNIT/ML IV SOLN
500.0000 [IU] | Freq: Once | INTRAVENOUS | Status: AC | PRN
  Administered 2014-05-03: 500 [IU]
  Filled 2014-05-03: qty 5

## 2014-05-03 MED ORDER — MIRTAZAPINE 15 MG PO TABS: ORAL_TABLET | ORAL | Status: DC

## 2014-05-03 MED ORDER — SODIUM CHLORIDE 0.9 % IV SOLN
640.0000 mg/m2 | Freq: Once | INTRAVENOUS | Status: AC
  Administered 2014-05-03: 1064 mg via INTRAVENOUS
  Filled 2014-05-03: qty 22.8

## 2014-05-03 NOTE — Patient Instructions (Signed)
[facility] Discharge Instructions for Patients Receiving Chemotherapy  Today you received the following chemotherapy agents:  Gemzar and carboplatin  If you develop nausea and vomiting, or diarrhea that is not controlled by your medication, call the clinic.  The clinic phone number is [phone]. Office hours are Monday-Friday 8:30am-5:00pm.  BELOW ARE SYMPTOMS THAT SHOULD BE REPORTED IMMEDIATELY:  *FEVER GREATER THAN 101.0 F  *CHILLS WITH OR WITHOUT FEVER  NAUSEA AND VOMITING THAT IS NOT CONTROLLED WITH YOUR NAUSEA MEDICATION  *UNUSUAL SHORTNESS OF BREATH  *UNUSUAL BRUISING OR BLEEDING  TENDERNESS IN MOUTH AND THROAT WITH OR WITHOUT PRESENCE OF ULCERS  *URINARY PROBLEMS  *BOWEL PROBLEMS  UNUSUAL RASH Items with * indicate a potential emergency and should be followed up as soon as possible. If you have an emergency after office hours please contact your primary care physician or go to the nearest emergency department.  Please call the clinic during office hours if you have any questions or concerns.   You may also contact the Patient Navigator at [phone] should you have any questions or need assistance in obtaining follow up care. _____________________________________________________________________ Have you asked about our STAR program?    STAR stands for Survivorship Training and Rehabilitation, and this is a nationally recognized cancer care program that focuses on survivorship and rehabilitation.  Cancer and cancer treatments may cause problems, such as, pain, making you feel tired and keeping you from doing the things that you need or want to do. Cancer rehabilitation can help. Our goal is to reduce these troubling effects and help you have the best quality of life possible.  You may receive a survey from a nurse that asks questions about your current state of health.  Based on the survey results, all eligible patients will be referred to the [facility]  program for an evaluation so we can better serve you! A frequently asked questions sheet is available upon request.

## 2014-05-03 NOTE — Assessment & Plan Note (Signed)
He continues to smoke and is currently not interested in stopping. We will continue to discuss this with him at each visit.

## 2014-05-03 NOTE — Progress Notes (Signed)
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 He has met with Dr. [name] and states he is due for simulation on the 29th. He is to begin radiation for his bladder cancer on the 30th. He does not want to proceed with radiation until he is sure that the radiation for his lung cancer has worked. He states he really would like a CT scan of the chest prior to proceeding with radiation of the bladder. His wife states they have called in [place] and talked with one of the nurses have not heard anything back. We have had to delay his last several treatments with Carbo and Gemzar secondary to blood counts. He is only been dose reduced.  Patient remains depressed and is still not open to going to therapy. He says he does not see any way to get over his depression because of his medical bills. He admits though he is doing better overall he now sleeps up to 6 hours a night.  He has developed new left-sided pain he is worried it could be related to his lung cancer. He says at night his left left side to the point he cannot sleep on it. He continues to smoke.    Squamous cell carcinoma of left lung   10/25/2013 Initial Diagnosis T1N0 SCCA of the left lower lobe    Radiation Therapy     Bladder cancer   01/18/2014 Initial Diagnosis 1. Bladder, biopsy, right lateral wall of bladder - UROTHELIAL CARCINOMA IN SITU, NO STROMAL INVASION IDENTIFIED.  3. Prostate, TUR, deep prostatic tissue - HIGH GRADE UROTHELIAL CARCINOMA WITH ASSOCIATED TUMOR NECROSIS, INVOLVING PROSTATIC DUCTS    02/14/2014 -  Chemotherapy Carboplatin/Gemzar     Past Medical History  Diagnosis Date  . COPD (chronic obstructive pulmonary disease)   . Inflammatory bowel disease   . Constipation   . Lesion of bladder   . Prostate mass   . Dysuria-frequency syndrome     wears depends  . Arthritis   . Shortness of breath   . Depression   . Anxiety   . Kidney stones   . GERD (gastroesophageal reflux disease)   . Cancer 2015    LT lung, bladder and prostate    has IBS (irritable bowel syndrome); Back pain; Heme positive stool; Squamous cell carcinoma of left lung; Bladder cancer; Tobacco abuse; Hematuria; and Neoplasm related pain on his problem list.     is allergic to lyrica; gabapentin; and morphine and related.  Brian Sims had no medications administered during this visit.  Past Surgical History  Procedure Laterality Date  . Back surgery    . Left shoulder      arthroscopy  . Rt knee arthroscopy    . Colonoscopy N/A 01/11/2013    Procedure: COLONOSCOPY;  Surgeon: [name], MD;  [facility];  Service: Endoscopy;  Laterality: N/A;  340  . Carpal tunnel release Right   . Transurethral resection of bladder tumor N/A 09/21/2013    Procedure: TRANSURETHRAL RESECTION  PROSTATE  AND BLADDER ;  Surgeon: [name], MD;  Location:  [place];  Service: Urology;  Laterality: N/A;  . Video bronchoscopy with endobronchial navigation Right 10/25/2013    Procedure: VIDEO BRONCHOSCOPY WITH ENDOBRONCHIAL NAVIGATION;  Surgeon: [name], MD;  Location: [place];  Service: Thoracic;  Laterality: Right;  . Transurethral resection of bladder tumor with gyrus (turbt-gyrus) N/A 01/18/2014    Procedure: TRANSURETHRAL RESECTION OF BLADDER TUMOR WITH GYRUS (TURBT-GYRUS);  Surgeon: [name], MD;  [facility];  Service: Urology;  Laterality: N/A;  . Transurethral resection of prostate N/A 01/18/2014    Procedure: TRANSURETHRAL RESECTION OF THE PROSTATE (TURP);   Surgeon: [name], MD;  Location: [facility];  Service: Urology;  Laterality: N/A;    Denies any headaches, dizziness, double vision, fevers, chills, night sweats, nausea, vomiting, diarrhea, constipation, chest pain, heart palpitations, shortness of breath, blood in stool, black tarry stool, urinary pain, urinary burning, urinary frequency, hematuria.   PHYSICAL EXAMINATION  ECOG PERFORMANCE STATUS: 1 - Symptomatic but completely ambulatory  Filed Vitals:   05/03/14 1030  BP: 117/74  Pulse: 79  Temp: 98.5 F (36.9 C)  Resp: 18    GENERAL:alert, no distress, comfortable, cooperative and chronically ill appearing, accompanied by wife and son. SKIN: skin color, texture, turgor are normal, no rashes or significant lesions HEAD: Normocephalic, No masses, lesions, tenderness or abnormalities EYES: normal, PERRLA EARS: External ears normal OROPHARYNX:lips, buccal mucosa, and tongue normal and mucous membranes are moist  NECK: supple, trachea midline LYMPH: no palpable adenopathy in the neck or supraclavicular regions BREAST:not examined LUNGS: decreased breath sounds HEART: regular rate & rhythm, no murmurs and no gallops ABDOMEN:abdomen soft and normal bowel sounds, wearing adult depends BACK: Back symmetric, no curvature. EXTREMITIES:less then 2 second capillary refill, no joint deformities, effusion, or inflammation, no skin discoloration, no cyanosis  NEURO: alert & oriented x 3 with fluent speech, no focal motor/sensory deficits   LABORATORY DATA: CBC Results for Brian Sims, Brian Sims (MRN [id number]) as of 04/25/2014 08:24  Ref. Range 04/05/2014 10:49  Sodium Latest Range: 135-145 mmol/L 137  Potassium Latest Range: 3.5-5.1 mmol/L 3.5  Chloride Latest Range: 96-112 mmol/L 106  CO2 Latest Range: 19-32 mmol/L 27  BUN Latest Range: 6-23 mg/dL 10  Creatinine Latest Range: 0.50-1.35 mg/dL 0.88  Calcium Latest Range: 8.4-10.5 mg/dL 8.7  GFR calc non Af Amer Latest Range: >90 mL/min  >90  GFR calc Af Amer Latest Range: >90 mL/min >90  Glucose Latest Range: 70-99 mg/dL 117 (H)  Anion gap Latest Range: 5-15  4 (L)  Alkaline Phosphatase Latest Range: 39-117 U/L 64  Albumin Latest Range: 3.5-5.2 g/dL 3.8  AST Latest Range: 0-37 U/L 19  ALT Latest Range: 0-53 U/L 13  Total Protein Latest Range: 6.0-8.3 g/dL 6.4  Total Bilirubin Latest Range: 0.3-1.2 mg/dL 0.2 (L)  WBC Latest Range: 4.0-10.5 K/uL 3.6 (L)  RBC Latest Range: 4.22-5.81 MIL/uL 3.14 (L)  Hemoglobin Latest Range: 13.0-17.0 g/dL 10.1 (L)  HCT Latest Range: 39.0-52.0 % 30.6 (L)  MCV Latest Range: 78.0-100.0 fL 97.5  MCH Latest Range: 26.0-34.0 pg 32.2  MCHC Latest Range: 30.0-36.0 g/dL 33.0  RDW Latest Range: 11.5-15.5 % 17.0 (H)  Platelets Latest Range: 150-400 K/uL 66 (L)  Neutrophils Relative % Latest Range: 43-77 % 55  Lymphocytes Relative Latest Range: 12-46 % 30  Monocytes Relative Latest Range: 3-12 % 14 (H)  Eosinophils Relative Latest Range: 0-5 % 1  Basophils Relative Latest Range: 0-1 % 0  NEUT# Latest Range: 1.7-7.7 K/uL 1.9  Lymphocytes Absolute Latest  Range: 0.7-4.0 K/uL 1.1  Monocytes Absolute Latest Range: 0.1-1.0 K/uL 0.5  Eosinophils Absolute Latest Range: 0.0-0.7 K/uL 0.0  Basophils Absolute Latest Range: 0.0-0.1 K/uL 0.0       ASSESSMENT AND PLAN:   Bladder cancer High-grade urothelial carcinoma with invasion into the prostate. He has been on carboplatin and Gemzar. For the most part he is tolerated treatment without difficulty although we have had to dose reduce secondary to blood count abnormalities. He has had several delays in treatment because of persistence with his thrombocytopenia and or neutropenia. He will proceed with treatment today. I have recommended changing his treatment to weekly low dose carboplatin, AUC 2, during radiation.     Tobacco abuse He continues to smoke and is currently not interested in stopping. We will continue to discuss this with him at each  visit.   Depression I believe this is a significant component of many of his problems. He is finally taking his trazodone as prescribed and is sleeping better. He is taking Remeron regularly.He had an appointment with behavioral health but did not go. I again encouraged him to please keep the appointments we try to schedule for him. We will continue to work with this issue moving forward.  Pelvic pain  Continues to be an issue although it is better when he takes his pain medications. He does stop taking pain medications early in the afternoon because they keep him up at night. We tried multiple long-acting narcotics and he was intolerant. He seems frustrated because his pain is not improved. I discussed with him that I am not sure that the cancer is the only cause of his pain. We will continue to address this moving forward as well.  Stage I non-small cell lung cancer  The neck he will not proceed with radiation to the bladder until he has had imaging of the chest I will check to see if Dr. [name] has ordered this. If not we will order a CT of the chest and make sure that she is notified.  All questions were answered. The patient knows to call the clinic with any problems, questions or concerns. We can certainly see the patient much sooner if necessary.  This note is electronically signed by: Molli Hazard 05/03/2014 11:43 AM

## 2014-05-03 NOTE — Assessment & Plan Note (Signed)
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 He has been on carboplatin and Gemzar. For the most part he is tolerated treatment without difficulty although we have had to dose reduce secondary to blood count abnormalities. He has had several delays in treatment because of persistence with his thrombocytopenia and or neutropenia. He will proceed with treatment today. I have recommended changing his treatment to weekly low dose carboplatin, AUC 2, during radiation.

## 2014-05-03 NOTE — Progress Notes (Signed)
1500:  Tolerated tx w/o adverse reaction; VSS; a&ox4; discharged via wheelchair; left in c/o family for transport home.

## 2014-05-04 ENCOUNTER — Encounter: Payer: Self-pay | Admitting: Dietician

## 2014-05-04 NOTE — Progress Notes (Signed)
Received request for consultation by wife and patient  Contacted Pt by Phone  Wt Readings from Last 10 Encounters:  05/03/14 128 lb 4.8 oz (58.196 kg)  04/25/14 128 lb 12.8 oz (58.423 kg)  04/19/14 127 lb 9.6 oz (57.879 kg)  04/12/14 129 lb 3.2 oz (58.605 kg)  04/11/14 128 lb 9.6 oz (58.333 kg)  04/05/14 130 lb 14.4 oz (59.376 kg)  03/22/14 126 lb 6.4 oz (57.335 kg)  03/14/14 126 lb 3.2 oz (57.244 kg)  02/21/14 127 lb (57.607 kg)  02/14/14 127 lb (57.607 kg)   Patient weight has Remained stable for the past 3 months.   Wife reports pts oral intake as fair and he is suffering from symptoms including tender mouth and abdominal/pelvic pain  Pt's wife repotrs pt has a boost in the morning and then has 1 meal late in the day. He can not eat early due to severe pain, presumably from his disease and it takes him a while to get his pain under control. He does not take pain meds at night as he cant sleep on them . They do not eat red meat (upsets pts IBS). Patient does eat chicken, eggs, pb sandwiches as sources of protein.   Wife wanted to makes sure that she was providing patient with the right foods and that she was giving him enough protein. She also requested one case of ensure because pt goes through them so quickly  Pt's wife seems to be overwhelmed with pt's medical issues, but was thankful for the discussion.   Mailed my contact info, coupons, and handouts titled "Increasing protein and calories"   Burtis Junes RD, LDN Nutrition Pager: [phone] 05/04/2014 11:15 AM

## 2014-05-07 ENCOUNTER — Ambulatory Visit (HOSPITAL_COMMUNITY): Payer: Medicare Other

## 2014-05-10 ENCOUNTER — Inpatient Hospital Stay (HOSPITAL_COMMUNITY): Payer: Self-pay

## 2014-05-11 ENCOUNTER — Other Ambulatory Visit (HOSPITAL_COMMUNITY): Payer: Self-pay | Admitting: Oncology

## 2014-05-11 ENCOUNTER — Encounter (HOSPITAL_COMMUNITY): Payer: Medicare Other | Attending: Oncology

## 2014-05-11 ENCOUNTER — Encounter (HOSPITAL_COMMUNITY): Payer: Self-pay

## 2014-05-11 VITALS — BP 123/74 | HR 69 | Temp 98.5°F | Resp 18 | Wt 128.8 lb

## 2014-05-11 DIAGNOSIS — Z5111 Encounter for antineoplastic chemotherapy: Secondary | ICD-10-CM | POA: Diagnosis not present

## 2014-05-11 DIAGNOSIS — R319 Hematuria, unspecified: Secondary | ICD-10-CM | POA: Insufficient documentation

## 2014-05-11 DIAGNOSIS — E876 Hypokalemia: Secondary | ICD-10-CM

## 2014-05-11 DIAGNOSIS — C3492 Malignant neoplasm of unspecified part of left bronchus or lung: Secondary | ICD-10-CM

## 2014-05-11 DIAGNOSIS — C679 Malignant neoplasm of bladder, unspecified: Secondary | ICD-10-CM | POA: Diagnosis not present

## 2014-05-11 DIAGNOSIS — Z720 Tobacco use: Secondary | ICD-10-CM | POA: Diagnosis not present

## 2014-05-11 LAB — COMPREHENSIVE METABOLIC PANEL
ALBUMIN: 3.7 g/dL (ref 3.5–5.2)
ALK PHOS: 55 U/L (ref 39–117)
ALT: 11 U/L (ref 0–53)
AST: 18 U/L (ref 0–37)
Anion gap: 6 (ref 5–15)
BILIRUBIN TOTAL: 0.4 mg/dL (ref 0.3–1.2)
BUN: 9 mg/dL (ref 6–23)
CHLORIDE: 106 mmol/L (ref 96–112)
CO2: 29 mmol/L (ref 19–32)
Calcium: 9 mg/dL (ref 8.4–10.5)
Creatinine, Ser: 0.75 mg/dL (ref 0.50–1.35)
GFR calc Af Amer: >90 mL/min (ref >90)
GFR calc non Af Amer: >90 mL/min (ref >90)
Glucose, Bld: 105 mg/dL — ABNORMAL HIGH (ref 70–99)
Potassium: 3.3 mmol/L — ABNORMAL LOW (ref 3.5–5.1)
Sodium: 141 mmol/L (ref 135–145)
Total Protein: 6.3 g/dL (ref 6.0–8.3)

## 2014-05-11 LAB — CBC WITH DIFFERENTIAL/PLATELET
BASOS ABS: 0 10*3/uL (ref 0.0–0.1)
Basophils Relative: 1 % (ref 0–1)
Eosinophils Absolute: 0.1 10*3/uL (ref 0.0–0.7)
Eosinophils Relative: 2 % (ref 0–5)
HCT: 34.3 % — ABNORMAL LOW (ref 39.0–52.0)
HEMOGLOBIN: 11.5 g/dL — AB (ref 13.0–17.0)
LYMPHS PCT: 34 % (ref 12–46)
Lymphs Abs: 1.1 10*3/uL (ref 0.7–4.0)
MCH: 34.2 pg — ABNORMAL HIGH (ref 26.0–34.0)
MCHC: 33.5 g/dL (ref 30.0–36.0)
MCV: 102.1 fL — ABNORMAL HIGH (ref 78.0–100.0)
MONO ABS: 0.2 10*3/uL (ref 0.1–1.0)
MONOS PCT: 5 % (ref 3–12)
NEUTROS ABS: 2 10*3/uL (ref 1.7–7.7)
Neutrophils Relative %: 59 % (ref 43–77)
Platelets: 86 10*3/uL — ABNORMAL LOW (ref 150–400)
RBC: 3.36 MIL/uL — AB (ref 4.22–5.81)
RDW: 15.9 % — ABNORMAL HIGH (ref 11.5–15.5)
WBC: 3.4 10*3/uL — AB (ref 4.0–10.5)

## 2014-05-11 MED ORDER — SODIUM CHLORIDE 0.9 % IV SOLN
Freq: Once | INTRAVENOUS | Status: AC
  Administered 2014-05-11: 13:00:00 via INTRAVENOUS

## 2014-05-11 MED ORDER — POTASSIUM CHLORIDE CRYS ER 20 MEQ PO TBCR: 20.0000 meq | EXTENDED_RELEASE_TABLET | Freq: Every day | ORAL | Status: DC

## 2014-05-11 MED ORDER — HEPARIN SOD (PORK) LOCK FLUSH 100 UNIT/ML IV SOLN
500.0000 [IU] | Freq: Once | INTRAVENOUS | Status: AC | PRN
Start: 2014-05-11 — End: 2014-05-11
  Administered 2014-05-11: 500 [IU]

## 2014-05-11 MED ORDER — SODIUM CHLORIDE 0.9 % IJ SOLN: 10.0000 mL | INTRAMUSCULAR | Status: DC | PRN

## 2014-05-11 MED ORDER — SODIUM CHLORIDE 0.9 % IV SOLN
210.0000 mg | Freq: Once | INTRAVENOUS | Status: AC
  Administered 2014-05-11: 210 mg via INTRAVENOUS
  Filled 2014-05-11: qty 21

## 2014-05-11 MED ORDER — SODIUM CHLORIDE 0.9 % IV SOLN
Freq: Once | INTRAVENOUS | Status: AC
  Administered 2014-05-11: 13:00:00 via INTRAVENOUS
  Filled 2014-05-11: qty 4

## 2014-05-11 NOTE — Progress Notes (Signed)
Tolerated well

## 2014-05-11 NOTE — Patient Instructions (Signed)
.  [facility] Discharge Instructions for Patients Receiving Chemotherapy  Today you received the following chemotherapy agents carboplatin  If you develop nausea and vomiting, or diarrhea that is not controlled by your medication, call the clinic.  The clinic phone number is [phone]. Office hours are Monday-Friday 8:30am-5:00pm.  BELOW ARE SYMPTOMS THAT SHOULD BE REPORTED IMMEDIATELY:  *FEVER GREATER THAN 101.0 F  *CHILLS WITH OR WITHOUT FEVER  NAUSEA AND VOMITING THAT IS NOT CONTROLLED WITH YOUR NAUSEA MEDICATION  *UNUSUAL SHORTNESS OF BREATH  *UNUSUAL BRUISING OR BLEEDING  TENDERNESS IN MOUTH AND THROAT WITH OR WITHOUT PRESENCE OF ULCERS  *URINARY PROBLEMS  *BOWEL PROBLEMS  UNUSUAL RASH Items with * indicate a potential emergency and should be followed up as soon as possible. If you have an emergency after office hours please contact your primary care physician or go to the nearest emergency department.  Please call the clinic during office hours if you have any questions or concerns.   You may also contact the Patient Navigator at [phone] should you have any questions or need assistance in obtaining follow up care. _____________________________________________________________________ Have you asked about our STAR program?    STAR stands for Survivorship Training and Rehabilitation, and this is a nationally recognized cancer care program that focuses on survivorship and rehabilitation.  Cancer and cancer treatments may cause problems, such as, pain, making you feel tired and keeping you from doing the things that you need or want to do. Cancer rehabilitation can help. Our goal is to reduce these troubling effects and help you have the best quality of life possible.  You may receive a survey from a nurse that asks questions about your current state of health.  Based on the survey results, all eligible patients will be referred to the [facility] program for  an evaluation so we can better serve you! A frequently asked questions sheet is available upon request.

## 2014-05-17 ENCOUNTER — Ambulatory Visit (HOSPITAL_COMMUNITY): Payer: Self-pay | Admitting: Hematology & Oncology

## 2014-05-17 ENCOUNTER — Ambulatory Visit (HOSPITAL_COMMUNITY)
Admission: RE | Admit: 2014-05-17 | Discharge: 2014-05-17 | Disposition: A | Discharge status: Home / Self Care | Payer: Medicare Other | Source: Ambulatory Visit | Attending: Hematology & Oncology | Admitting: Hematology & Oncology

## 2014-05-17 ENCOUNTER — Inpatient Hospital Stay (HOSPITAL_COMMUNITY): Payer: Self-pay

## 2014-05-17 DIAGNOSIS — C3492 Malignant neoplasm of unspecified part of left bronchus or lung: Secondary | ICD-10-CM

## 2014-05-17 DIAGNOSIS — Z08 Encounter for follow-up examination after completed treatment for malignant neoplasm: Secondary | ICD-10-CM | POA: Diagnosis not present

## 2014-05-17 DIAGNOSIS — R918 Other nonspecific abnormal finding of lung field: Secondary | ICD-10-CM | POA: Diagnosis not present

## 2014-05-17 DIAGNOSIS — Z923 Personal history of irradiation: Secondary | ICD-10-CM | POA: Diagnosis not present

## 2014-05-17 MED ORDER — IOHEXOL 300 MG/ML  SOLN
80.0000 mL | Freq: Once | INTRAMUSCULAR | Status: AC | PRN
  Administered 2014-05-17: 80 mL via INTRAVENOUS

## 2014-05-18 ENCOUNTER — Encounter (HOSPITAL_COMMUNITY): Payer: Self-pay

## 2014-05-18 ENCOUNTER — Encounter (HOSPITAL_BASED_OUTPATIENT_CLINIC_OR_DEPARTMENT_OTHER): Payer: Medicare Other

## 2014-05-18 VITALS — BP 126/68 | HR 74 | Temp 98.1°F | Resp 18 | Wt 129.6 lb

## 2014-05-18 DIAGNOSIS — C679 Malignant neoplasm of bladder, unspecified: Secondary | ICD-10-CM | POA: Diagnosis not present

## 2014-05-18 DIAGNOSIS — D696 Thrombocytopenia, unspecified: Secondary | ICD-10-CM | POA: Diagnosis not present

## 2014-05-18 DIAGNOSIS — C3492 Malignant neoplasm of unspecified part of left bronchus or lung: Secondary | ICD-10-CM

## 2014-05-18 LAB — CBC WITH DIFFERENTIAL/PLATELET
BASOS ABS: 0 10*3/uL (ref 0.0–0.1)
BASOS PCT: 0 % (ref 0–1)
EOS ABS: 0.1 10*3/uL (ref 0.0–0.7)
Eosinophils Relative: 2 % (ref 0–5)
HCT: 34.7 % — ABNORMAL LOW (ref 39.0–52.0)
Hemoglobin: 11.4 g/dL — ABNORMAL LOW (ref 13.0–17.0)
LYMPHS ABS: 0.9 10*3/uL (ref 0.7–4.0)
Lymphocytes Relative: 27 % (ref 12–46)
MCH: 33.8 pg (ref 26.0–34.0)
MCHC: 32.9 g/dL (ref 30.0–36.0)
MCV: 103 fL — ABNORMAL HIGH (ref 78.0–100.0)
Monocytes Absolute: 0.4 10*3/uL (ref 0.1–1.0)
Monocytes Relative: 12 % (ref 3–12)
NEUTROS PCT: 59 % (ref 43–77)
Neutro Abs: 2 10*3/uL (ref 1.7–7.7)
PLATELETS: 54 10*3/uL — AB (ref 150–400)
RBC: 3.37 MIL/uL — ABNORMAL LOW (ref 4.22–5.81)
RDW: 16.3 % — AB (ref 11.5–15.5)
SMEAR REVIEW: DECREASED
WBC: 3.3 10*3/uL — ABNORMAL LOW (ref 4.0–10.5)

## 2014-05-18 LAB — COMPREHENSIVE METABOLIC PANEL
ALT: 12 U/L (ref 0–53)
AST: 17 U/L (ref 0–37)
Albumin: 3.8 g/dL (ref 3.5–5.2)
Alkaline Phosphatase: 56 U/L (ref 39–117)
Anion gap: 6 (ref 5–15)
BILIRUBIN TOTAL: 0.4 mg/dL (ref 0.3–1.2)
BUN: 9 mg/dL (ref 6–23)
CHLORIDE: 105 mmol/L (ref 96–112)
CO2: 28 mmol/L (ref 19–32)
CREATININE: 0.75 mg/dL (ref 0.50–1.35)
Calcium: 9.1 mg/dL (ref 8.4–10.5)
GLUCOSE: 102 mg/dL — AB (ref 70–99)
Potassium: 3.7 mmol/L (ref 3.5–5.1)
Sodium: 139 mmol/L (ref 135–145)
Total Protein: 6.2 g/dL (ref 6.0–8.3)

## 2014-05-18 MED ORDER — HEPARIN SOD (PORK) LOCK FLUSH 100 UNIT/ML IV SOLN
500.0000 [IU] | Freq: Once | INTRAVENOUS | Status: AC
  Administered 2014-05-18: 500 [IU] via INTRAVENOUS

## 2014-05-18 MED ORDER — SODIUM CHLORIDE 0.9 % IJ SOLN: 10.0000 mL | Freq: Once | INTRAMUSCULAR | Status: DC

## 2014-05-18 MED ORDER — SODIUM CHLORIDE 0.9 % IV SOLN
Freq: Once | INTRAVENOUS | Status: AC
  Administered 2014-05-18: 13:00:00 via INTRAVENOUS

## 2014-05-18 NOTE — Patient Instructions (Signed)
..  [facility] Discharge Instructions for Patients Receiving Chemotherapy  Today your chemo was held because your platelets were low at 54,000  Return next week   Your chest CT show nothing new and improved appearance of left lower lobe lung mass   If you develop nausea and vomiting, or diarrhea that is not controlled by your medication, call the clinic.  The clinic phone number is [phone]. Office hours are Monday-Friday 8:30am-5:00pm.  BELOW ARE SYMPTOMS THAT SHOULD BE REPORTED IMMEDIATELY:  *FEVER GREATER THAN 101.0 F  *CHILLS WITH OR WITHOUT FEVER  NAUSEA AND VOMITING THAT IS NOT CONTROLLED WITH YOUR NAUSEA MEDICATION  *UNUSUAL SHORTNESS OF BREATH  *UNUSUAL BRUISING OR BLEEDING  TENDERNESS IN MOUTH AND THROAT WITH OR WITHOUT PRESENCE OF ULCERS  *URINARY PROBLEMS  *BOWEL PROBLEMS  UNUSUAL RASH Items with * indicate a potential emergency and should be followed up as soon as possible. If you have an emergency after office hours please contact your primary care physician or go to the nearest emergency department.  Please call the clinic during office hours if you have any questions or concerns.   You may also contact the Patient Navigator at [phone] should you have any questions or need assistance in obtaining follow up care. _____________________________________________________________________ Have you asked about our STAR program?    STAR stands for Survivorship Training and Rehabilitation, and this is a nationally recognized cancer care program that focuses on survivorship and rehabilitation.  Cancer and cancer treatments may cause problems, such as, pain, making you feel tired and keeping you from doing the things that you need or want to do. Cancer rehabilitation can help. Our goal is to reduce these troubling effects and help you have the best quality of life possible.  You may receive a survey from a nurse that asks questions about your  current state of health.  Based on the survey results, all eligible patients will be referred to the [facility] program for an evaluation so we can better serve you! A frequently asked questions sheet is available upon request.

## 2014-05-18 NOTE — Progress Notes (Signed)
Chemo held today due to low platelets

## 2014-05-21 ENCOUNTER — Ambulatory Visit (HOSPITAL_COMMUNITY): Payer: Self-pay | Admitting: Oncology

## 2014-05-22 ENCOUNTER — Encounter (HOSPITAL_COMMUNITY): Payer: Self-pay | Admitting: Oncology

## 2014-05-24 ENCOUNTER — Inpatient Hospital Stay (HOSPITAL_COMMUNITY): Payer: Self-pay

## 2014-05-25 ENCOUNTER — Encounter (HOSPITAL_COMMUNITY): Payer: Medicare Other | Attending: Oncology | Admitting: Oncology

## 2014-05-25 ENCOUNTER — Encounter (HOSPITAL_BASED_OUTPATIENT_CLINIC_OR_DEPARTMENT_OTHER): Payer: Medicare Other

## 2014-05-25 ENCOUNTER — Encounter (HOSPITAL_COMMUNITY): Payer: Self-pay | Admitting: Oncology

## 2014-05-25 VITALS — BP 117/71 | HR 73 | Temp 98.9°F | Resp 18 | Wt 128.6 lb

## 2014-05-25 DIAGNOSIS — C3492 Malignant neoplasm of unspecified part of left bronchus or lung: Secondary | ICD-10-CM

## 2014-05-25 DIAGNOSIS — C679 Malignant neoplasm of bladder, unspecified: Secondary | ICD-10-CM

## 2014-05-25 DIAGNOSIS — Z5111 Encounter for antineoplastic chemotherapy: Secondary | ICD-10-CM | POA: Diagnosis not present

## 2014-05-25 DIAGNOSIS — C7982 Secondary malignant neoplasm of genital organs: Secondary | ICD-10-CM

## 2014-05-25 LAB — CBC WITH DIFFERENTIAL/PLATELET
BASOS PCT: 1 % (ref 0–1)
Basophils Absolute: 0 10*3/uL (ref 0.0–0.1)
EOS ABS: 0.1 10*3/uL (ref 0.0–0.7)
EOS PCT: 2 % (ref 0–5)
HCT: 37.4 % — ABNORMAL LOW (ref 39.0–52.0)
HEMOGLOBIN: 12.7 g/dL — AB (ref 13.0–17.0)
LYMPHS ABS: 1.1 10*3/uL (ref 0.7–4.0)
Lymphocytes Relative: 27 % (ref 12–46)
MCH: 35.2 pg — ABNORMAL HIGH (ref 26.0–34.0)
MCHC: 34 g/dL (ref 30.0–36.0)
MCV: 103.6 fL — AB (ref 78.0–100.0)
MONO ABS: 0.8 10*3/uL (ref 0.1–1.0)
MONOS PCT: 20 % — AB (ref 3–12)
Neutro Abs: 2.1 10*3/uL (ref 1.7–7.7)
Neutrophils Relative %: 52 % (ref 43–77)
Platelets: 110 10*3/uL — ABNORMAL LOW (ref 150–400)
RBC: 3.61 MIL/uL — AB (ref 4.22–5.81)
RDW: 15.9 % — ABNORMAL HIGH (ref 11.5–15.5)
WBC: 4 10*3/uL (ref 4.0–10.5)

## 2014-05-25 LAB — COMPREHENSIVE METABOLIC PANEL
ALK PHOS: 56 U/L (ref 39–117)
ALT: 9 U/L (ref 0–53)
ANION GAP: 7 (ref 5–15)
AST: 17 U/L (ref 0–37)
Albumin: 3.8 g/dL (ref 3.5–5.2)
BUN: 9 mg/dL (ref 6–23)
CO2: 29 mmol/L (ref 19–32)
Calcium: 9.1 mg/dL (ref 8.4–10.5)
Chloride: 105 mmol/L (ref 96–112)
Creatinine, Ser: 0.75 mg/dL (ref 0.50–1.35)
GFR calc Af Amer: >90 mL/min (ref >90)
GFR calc non Af Amer: >90 mL/min (ref >90)
Glucose, Bld: 103 mg/dL — ABNORMAL HIGH (ref 70–99)
Potassium: 3.6 mmol/L (ref 3.5–5.1)
Sodium: 141 mmol/L (ref 135–145)
Total Bilirubin: 0.4 mg/dL (ref 0.3–1.2)
Total Protein: 6.7 g/dL (ref 6.0–8.3)

## 2014-05-25 MED ORDER — SODIUM CHLORIDE 0.9 % IV SOLN
Freq: Once | INTRAVENOUS | Status: AC
  Administered 2014-05-25: 14:00:00 via INTRAVENOUS
  Filled 2014-05-25: qty 4

## 2014-05-25 MED ORDER — HEPARIN SOD (PORK) LOCK FLUSH 100 UNIT/ML IV SOLN
500.0000 [IU] | Freq: Once | INTRAVENOUS | Status: AC | PRN
  Administered 2014-05-25: 500 [IU]

## 2014-05-25 MED ORDER — SODIUM CHLORIDE 0.9 % IJ SOLN: 10.0000 mL | INTRAMUSCULAR | Status: DC | PRN

## 2014-05-25 MED ORDER — SODIUM CHLORIDE 0.9 % IV SOLN
210.0000 mg | Freq: Once | INTRAVENOUS | Status: AC
  Administered 2014-05-25: 210 mg via INTRAVENOUS
  Filled 2014-05-25: qty 21

## 2014-05-25 MED ORDER — SODIUM CHLORIDE 0.9 % IV SOLN
Freq: Once | INTRAVENOUS | Status: AC
  Administered 2014-05-25: 14:00:00 via INTRAVENOUS

## 2014-05-25 NOTE — Patient Instructions (Signed)
[facility] Discharge Instructions for Patients Receiving Chemotherapy  Today you received the following chemotherapy agents:  carboplatin  If you develop nausea and vomiting, or diarrhea that is not controlled by your medication, call the clinic.  The clinic phone number is [phone]. Office hours are Monday-Friday 8:30am-5:00pm.  BELOW ARE SYMPTOMS THAT SHOULD BE REPORTED IMMEDIATELY:  *FEVER GREATER THAN 101.0 F  *CHILLS WITH OR WITHOUT FEVER  NAUSEA AND VOMITING THAT IS NOT CONTROLLED WITH YOUR NAUSEA MEDICATION  *UNUSUAL SHORTNESS OF BREATH  *UNUSUAL BRUISING OR BLEEDING  TENDERNESS IN MOUTH AND THROAT WITH OR WITHOUT PRESENCE OF ULCERS  *URINARY PROBLEMS  *BOWEL PROBLEMS  UNUSUAL RASH Items with * indicate a potential emergency and should be followed up as soon as possible. If you have an emergency after office hours please contact your primary care physician or go to the nearest emergency department.  Please call the clinic during office hours if you have any questions or concerns.   You may also contact the Patient Navigator at [phone] should you have any questions or need assistance in obtaining follow up care. _____________________________________________________________________ Have you asked about our STAR program?    STAR stands for Survivorship Training and Rehabilitation, and this is a nationally recognized cancer care program that focuses on survivorship and rehabilitation.  Cancer and cancer treatments may cause problems, such as, pain, making you feel tired and keeping you from doing the things that you need or want to do. Cancer rehabilitation can help. Our goal is to reduce these troubling effects and help you have the best quality of life possible.  You may receive a survey from a nurse that asks questions about your current state of health.  Based on the survey results, all eligible patients will be referred to the [facility] program for  an evaluation so we can better serve you! A frequently asked questions sheet is available upon request.

## 2014-05-25 NOTE — Assessment & Plan Note (Signed)
He received definitive radiation with Dr. [name].  CT chest imaging last week on 05/17/2014 demonstrated persistence of lesion, but decreased in size.

## 2014-05-25 NOTE — Assessment & Plan Note (Addendum)
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 He has been on carboplatin and Gemzar from Jan 2016- March 2016. For the most part he tolerated treatment without difficulty although we have had to dose reduce secondary to blood count abnormalities. He has had several delays in treatment because of persistence with his thrombocytopenia and or neutropenia. He is now on radiation therapy and therefore his treatment has been changed to Carboplatin single-agent at AUC of 2 with XRT.   Pre-chemotherapy labs as ordered.  His labs meet treatment parameters today and therefore we will move forward with planned treatment.    Will move chemotherapy treatment next week to Thursday as our clinic is closed on Friday.  Then we will return back to Friday chemotherapy treatments at the patient's request.  Return in 2 weeks for follow-up.  He may take BOTH Zofran and Compazine as prescribed for nausea control, even together, as needed.  Increase Prilosec to 40 mg daily for non-productive cough.  If ineffective, he may take previously prescribed cough syrup.  He is afebrile and not ill appearing.

## 2014-05-25 NOTE — Progress Notes (Signed)
Brian Bogus, MD Brian Sims Brian Sims [id number]  Malignant neoplasm of urinary bladder, unspecified site  Squamous cell carcinoma of left lung  CURRENT THERAPY: Carboplatin weekly at AUC of 2 with XRT  INTERVAL HISTORY: Brian Sims 64 y.o. 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    Squamous cell carcinoma of left lung   10/25/2013 Initial Diagnosis T1N0 SCCA of the left lower lobe    Radiation Therapy    05/17/2014 Imaging CT chest- Persistent but improved appearance of left lower lobe lung mass. Small pulmonary nodules in the right lung are unchanged. No new or progressive disease identified.    Bladder cancer   01/18/2014 Initial Diagnosis 1. Bladder, biopsy, right lateral wall of bladder - UROTHELIAL CARCINOMA IN SITU, NO STROMAL INVASION IDENTIFIED.  3. Prostate, TUR, deep prostatic tissue - HIGH GRADE UROTHELIAL CARCINOMA WITH ASSOCIATED TUMOR NECROSIS, INVOLVING PROSTATIC DUCTS    02/14/2014 - 05/03/2014 Chemotherapy Carboplatin/Gemzar   05/09/2014 -  Radiation Therapy    05/11/2014 Treatment Plan Change Change chemotherapy to weekly Carboplatin (AUC 2)   05/11/2014 -  Chemotherapy Weekly Carboplatin with XRT at  AUC 2.   I personally reviewed and went over laboratory results with the patient.  The results are noted within this dictation.  I personally reviewed and went over radiographic studies with the patient.  The results are noted within this dictation.  CT last was stable and demonstrated a positive response to radiation therapy for lung malignancy.  Brian Sims notes some nausea following XRT and Brian Sims is informed that Brian Sims can take BOTH zofran and compazine as prescribed as needed, even together at the same time since they are each a different class of anti-emetic.  His wife recorded my statement in her notebook.  Brian Sims notes a recurrence of his non-productive cough.  I have recommended an increase in Prilosec  to 40 mg daily. A new Rx will be escribed. If this is ineffective for his cough, Brian Sims has some cough syrup at home and Brian Sims can take this in addition to 40 mg of Prilosec daily.  Oncologically, Brian Sims otherwise denies any new complaints and ROS questioning is negative.  Past Medical History  Diagnosis Date  . COPD (chronic obstructive pulmonary disease)   . Inflammatory bowel disease   . Constipation   . Lesion of bladder   . Prostate mass   . Dysuria-frequency syndrome     wears depends  . Arthritis   . Shortness of breath   . Depression   . Anxiety   . Kidney stones   . GERD (gastroesophageal reflux disease)   . Cancer 2015    LT lung, bladder and prostate    has IBS (irritable bowel syndrome); Back pain; Heme positive stool; Squamous cell carcinoma of left lung; Bladder cancer; Tobacco abuse; Hematuria; and Neoplasm related pain on his problem list.     is allergic to lyrica; gabapentin; and morphine and related.  [name] does not currently have medications on file.  Past Surgical History  Procedure Laterality Date  . Back surgery    . Left shoulder      arthroscopy  . Rt knee arthroscopy    . Colonoscopy N/A 01/11/2013    Procedure: COLONOSCOPY;  Surgeon: Brian Houston, MD;  [facility];  Service: Endoscopy;  Laterality: N/A;  340  . Carpal tunnel release Right   . Transurethral resection of bladder tumor  N/A 09/21/2013    Procedure: TRANSURETHRAL RESECTION  PROSTATE  AND BLADDER ;  Surgeon: Brian So, MD;  Location: [facility];  Service: Urology;  Laterality: N/A;  . Video bronchoscopy with endobronchial navigation Right 10/25/2013    Procedure: VIDEO BRONCHOSCOPY WITH ENDOBRONCHIAL NAVIGATION;  Surgeon: Brian Isaac, MD;  Location: [place];  Service: Thoracic;  Laterality: Right;  . Transurethral resection of bladder tumor with gyrus (turbt-gyrus) N/A 01/18/2014    Procedure: TRANSURETHRAL RESECTION OF BLADDER TUMOR WITH GYRUS  (TURBT-GYRUS);  Surgeon: Brian So, MD;  Location: [facility];  Service: Urology;  Laterality: N/A;  . Transurethral resection of prostate N/A 01/18/2014    Procedure: TRANSURETHRAL RESECTION OF THE PROSTATE (TURP);  Surgeon: Brian So, MD;  Location: [facility];  Service: Urology;  Laterality: N/A;    Denies any headaches, dizziness, double vision, fevers, chills, night sweats, nausea, vomiting, diarrhea, constipation, chest pain, heart palpitations, shortness of breath, blood in stool, black tarry stool, urinary pain, urinary burning, urinary frequency, hematuria.   PHYSICAL EXAMINATION  ECOG PERFORMANCE STATUS: 2 - Symptomatic, <50% confined to bed  Filed Vitals:   05/25/14 1121  BP: 117/71  Pulse: 73  Temp: 98.9 F (37.2 C)  Resp: 18    GENERAL:alert, no distress, well nourished, well developed, comfortable, cooperative, smiling and chronically ill appearing, accompanied by his wife and son. SKIN: skin color, texture, turgor are normal, no rashes or significant lesions HEAD: Normocephalic, No masses, lesions, tenderness or abnormalities EYES: normal, PERRLA, EOMI, Conjunctiva are pink and non-injected EARS: External ears normal OROPHARYNX:lips, buccal mucosa, and tongue normal and mucous membranes are moist  NECK: supple, no adenopathy, thyroid normal size, non-tender, without nodularity, no stridor, non-tender, trachea midline LYMPH:  no palpable lymphadenopathy BREAST:not examined LUNGS: clear to auscultation , decreased breath sounds HEART: regular rate & rhythm ABDOMEN:abdomen soft, non-tender and normal bowel sounds BACK: Back symmetric, no curvature. EXTREMITIES:less then 2 second capillary refill, no joint deformities, effusion, or inflammation, no skin discoloration, no cyanosis  NEURO: alert & oriented x 3 with fluent speech, no focal motor/sensory deficits   LABORATORY DATA: CBC    Component Value Date/Time   WBC 4.0 05/25/2014 1130   RBC 3.61* 05/25/2014 1130    HGB 12.7* 05/25/2014 1130   HCT 37.4* 05/25/2014 1130   PLT 110* 05/25/2014 1130   MCV 103.6* 05/25/2014 1130   MCH 35.2* 05/25/2014 1130   MCHC 34.0 05/25/2014 1130   RDW 15.9* 05/25/2014 1130   LYMPHSABS 1.1 05/25/2014 1130   MONOABS 0.8 05/25/2014 1130   EOSABS 0.1 05/25/2014 1130   BASOSABS 0.0 05/25/2014 1130      Chemistry      Component Value Date/Time   NA 139 05/18/2014 1240   K 3.7 05/18/2014 1240   CL 105 05/18/2014 1240   CO2 28 05/18/2014 1240   BUN 9 05/18/2014 1240   CREATININE 0.75 05/18/2014 1240      Component Value Date/Time   CALCIUM 9.1 05/18/2014 1240   ALKPHOS 56 05/18/2014 1240   AST 17 05/18/2014 1240   ALT 12 05/18/2014 1240   BILITOT 0.4 05/18/2014 1240        RADIOGRAPHIC STUDIES:  Ct Chest W Contrast  05/17/2014   CLINICAL DATA:  Followup lung cancer  EXAM: CT CHEST WITH CONTRAST  TECHNIQUE: Multidetector CT imaging of the chest was performed during intravenous contrast administration.  CONTRAST:  42mL OMNIPAQUE IOHEXOL 300 MG/ML  SOLN  COMPARISON:  10/05/2013  FINDINGS: Mediastinum:  The heart size appears within normal limits. There is no pericardial effusion identified. The trachea is patent and appears midline. Mild circumferential wall thickening involving the mid and distal esophagus identified. No enlarged mediastinal or hilar lymph nodes. There is no supraclavicular or axillary adenopathy. Atherosclerotic disease involves the thoracic aorta as well as the LAD, left circumflex and RCA Coronary arteries.  Lungs/Pleura: No pleural effusion identified. Moderate changes of centrilobular and paraseptal emphysema noted. Tumor within the left lower lobe measures 1.5 x 1.9 cm, image 38/ series 2. Previously 2.5 x 2.7 cm. There is surrounding scarring and fibrosis consistent with changes of external beam radiation. Small right lower lobe nodule is stable measuring 3 mm, image 34/series 3. Stable 3 mm nodule in the right lower lobe, image 55/series 3.  Unchanged 3 mm right upper lobe nodule, image 23/series 3.  Upper Abdomen: 7 mm low attenuation structure within the left hepatic lobe is unchanged, image 57/series 2. Unremarkable appearance of the adrenal glands.  Musculoskeletal: Degenerative disc disease is noted within the lumbar spine. There are no aggressive lytic or sclerotic bone lesions identified.  IMPRESSION: 1. Persistent but improved appearance of left lower lobe lung mass. 2. Small pulmonary nodules in the right lung are unchanged. 3. No new or progressive disease identified.   Electronically Signed   By: Kerby Moors M.D.   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 Brian Sims has been on carboplatin and Gemzar from Jan 2016- March 2016. For the most part Brian Sims tolerated treatment without difficulty although we have had to dose reduce secondary to blood count abnormalities. Brian Sims has had several delays in treatment because of persistence with his thrombocytopenia and or neutropenia. Brian Sims is now on radiation therapy and therefore his treatment has been changed to Carboplatin single-agent at AUC of 2 with XRT.   Pre-chemotherapy labs as ordered.  His labs meet treatment parameters today and therefore we will move forward with planned treatment.    Will move chemotherapy treatment next week to Thursday as our clinic is closed on Friday.  Then we will return back to Friday chemotherapy treatments at the patient's request.  Return in 2 weeks for follow-up.  Brian Sims may take BOTH Zofran and Compazine as prescribed for nausea control, even together, as needed.   Squamous cell carcinoma of left lung Brian Sims received definitive radiation with Dr. [name].  CT chest imaging last week on 05/17/2014 demonstrated persistence of lesion, but decreased in size.    THERAPY PLAN:  Continue as planned.   All questions were answered. The patient knows to call the clinic with any  problems, questions or concerns. We can certainly see the patient much sooner if necessary.  Patient and plan discussed with Dr. Ancil Linsey and she is in agreement with the aforementioned.   This note is electronically signed by: Robynn Pane 05/25/2014 12:07 PM

## 2014-05-25 NOTE — Patient Instructions (Addendum)
[place] at [facility] Discharge Instructions  RECOMMENDATIONS MADE BY THE CONSULTANT AND ANY TEST RESULTS WILL BE SENT TO YOUR REFERRING PHYSICIAN.  Exam and discussion by Robynn Pane, PA-C You can take the zofran and compazine at the same time if needed.  Compazine will cause drowsiness. Increase prilosec to twice daily. If cough continues with increasing the omeprazole you can take a cough syrup.  Report fevers, uncontrolled nausea or vomiting or other concerns.  Follow-up next Thursday with chemotherapy and with chemotherapy and office visit in 2 weeks.  Thank you for choosing [place] at [facility] to provide your oncology and hematology care.  To afford each patient quality time with our provider, please arrive at least 15 minutes before your scheduled appointment time.    You need to re-schedule your appointment should you arrive 10 or more minutes late.  We strive to give you quality time with our providers, and arriving late affects you and other patients whose appointments are after yours.  Also, if you no show three or more times for appointments you may be dismissed from the clinic at the providers discretion.     Again, thank you for choosing [facility]  Our hope is that these requests will decrease the amount of time that you wait before being seen by our physicians.       _____________________________________________________________  Should you have questions after your visit to [facility], please contact our office at [phone] between the hours of 8:30 a.m. and 4:30 p.m.  Voicemails left after 4:30 p.m. will not be returned until the following business day.  For prescription refill requests, have your pharmacy contact our office.

## 2014-05-25 NOTE — Progress Notes (Signed)
Tolerated tx w/o adverse reaction.  A&ox4; in no distress. Discharged via wheelchair, left in c/o family for transport home.

## 2014-05-31 ENCOUNTER — Encounter (HOSPITAL_COMMUNITY): Payer: Self-pay

## 2014-05-31 ENCOUNTER — Encounter (HOSPITAL_BASED_OUTPATIENT_CLINIC_OR_DEPARTMENT_OTHER): Payer: Medicare Other

## 2014-05-31 VITALS — BP 132/69 | HR 80 | Temp 98.2°F | Resp 20 | Wt 127.8 lb

## 2014-05-31 DIAGNOSIS — C7982 Secondary malignant neoplasm of genital organs: Secondary | ICD-10-CM

## 2014-05-31 DIAGNOSIS — C679 Malignant neoplasm of bladder, unspecified: Secondary | ICD-10-CM

## 2014-05-31 DIAGNOSIS — Z5111 Encounter for antineoplastic chemotherapy: Secondary | ICD-10-CM | POA: Diagnosis not present

## 2014-05-31 DIAGNOSIS — C3492 Malignant neoplasm of unspecified part of left bronchus or lung: Secondary | ICD-10-CM

## 2014-05-31 LAB — CBC WITH DIFFERENTIAL/PLATELET
Basophils Absolute: 0 10*3/uL (ref 0.0–0.1)
Basophils Relative: 0 % (ref 0–1)
EOS ABS: 0.1 10*3/uL (ref 0.0–0.7)
Eosinophils Relative: 1 % (ref 0–5)
HEMATOCRIT: 35.9 % — AB (ref 39.0–52.0)
Hemoglobin: 11.8 g/dL — ABNORMAL LOW (ref 13.0–17.0)
LYMPHS PCT: 18 % (ref 12–46)
Lymphs Abs: 0.9 10*3/uL (ref 0.7–4.0)
MCH: 34.3 pg — ABNORMAL HIGH (ref 26.0–34.0)
MCHC: 32.9 g/dL (ref 30.0–36.0)
MCV: 104.4 fL — AB (ref 78.0–100.0)
MONO ABS: 0.8 10*3/uL (ref 0.1–1.0)
Monocytes Relative: 15 % — ABNORMAL HIGH (ref 3–12)
Neutro Abs: 3.5 10*3/uL (ref 1.7–7.7)
Neutrophils Relative %: 66 % (ref 43–77)
Platelets: 90 10*3/uL — ABNORMAL LOW (ref 150–400)
RBC: 3.44 MIL/uL — ABNORMAL LOW (ref 4.22–5.81)
RDW: 15.6 % — ABNORMAL HIGH (ref 11.5–15.5)
WBC: 5.3 10*3/uL (ref 4.0–10.5)

## 2014-05-31 LAB — URINALYSIS, ROUTINE W REFLEX MICROSCOPIC
BILIRUBIN URINE: NEGATIVE
Glucose, UA: NEGATIVE mg/dL
HGB URINE DIPSTICK: NEGATIVE
Ketones, ur: NEGATIVE mg/dL
Leukocytes, UA: NEGATIVE
Nitrite: NEGATIVE
PROTEIN: NEGATIVE mg/dL
Urobilinogen, UA: 0.2 mg/dL (ref 0.0–1.0)
pH: 5.5 (ref 5.0–8.0)

## 2014-05-31 LAB — COMPREHENSIVE METABOLIC PANEL
ALT: 9 U/L (ref 0–53)
AST: 19 U/L (ref 0–37)
Albumin: 3.5 g/dL (ref 3.5–5.2)
Alkaline Phosphatase: 61 U/L (ref 39–117)
Anion gap: 7 (ref 5–15)
BILIRUBIN TOTAL: 0.5 mg/dL (ref 0.3–1.2)
BUN: 9 mg/dL (ref 6–23)
CHLORIDE: 103 mmol/L (ref 96–112)
CO2: 28 mmol/L (ref 19–32)
Calcium: 8.7 mg/dL (ref 8.4–10.5)
Creatinine, Ser: 0.68 mg/dL (ref 0.50–1.35)
GFR calc Af Amer: >90 mL/min (ref >90)
GFR calc non Af Amer: >90 mL/min (ref >90)
Glucose, Bld: 120 mg/dL — ABNORMAL HIGH (ref 70–99)
Potassium: 3.4 mmol/L — ABNORMAL LOW (ref 3.5–5.1)
Sodium: 138 mmol/L (ref 135–145)
Total Protein: 6.4 g/dL (ref 6.0–8.3)

## 2014-05-31 MED ORDER — HEPARIN SOD (PORK) LOCK FLUSH 100 UNIT/ML IV SOLN
500.0000 [IU] | Freq: Once | INTRAVENOUS | Status: AC | PRN
  Administered 2014-05-31: 500 [IU]
  Filled 2014-05-31: qty 5

## 2014-05-31 MED ORDER — SODIUM CHLORIDE 0.9 % IV SOLN
210.0000 mg | Freq: Once | INTRAVENOUS | Status: AC
  Administered 2014-05-31: 210 mg via INTRAVENOUS
  Filled 2014-05-31: qty 21

## 2014-05-31 MED ORDER — SODIUM CHLORIDE 0.9 % IJ SOLN: 10.0000 mL | INTRAMUSCULAR | Status: DC | PRN

## 2014-05-31 MED ORDER — SODIUM CHLORIDE 0.9 % IV SOLN
Freq: Once | INTRAVENOUS | Status: AC
  Administered 2014-05-31: 250 mL via INTRAVENOUS

## 2014-05-31 MED ORDER — SODIUM CHLORIDE 0.9 % IV SOLN
Freq: Once | INTRAVENOUS | Status: AC
  Administered 2014-05-31: 15:00:00 via INTRAVENOUS
  Filled 2014-05-31: qty 4

## 2014-05-31 NOTE — Patient Instructions (Signed)
[place] at [facility]  Discharge Instructions:  You had carboplatin chemotherapy today.  Please follow up as scheduled. Call the clinic if you have any questions or concerns _______________________________________________________________  Thank you for choosing [place] at [facility] to provide your oncology and hematology care.  To afford each patient quality time with our providers, please arrive at least 15 minutes before your scheduled appointment.  You need to re-schedule your appointment if you arrive 10 or more minutes late.  We strive to give you quality time with our providers, and arriving late affects you and other patients whose appointments are after yours.  Also, if you no show three or more times for appointments you may be dismissed from the clinic.  Again, thank you for choosing [place] at [place] hope is that these requests will allow you access to exceptional care and in a timely manner. _______________________________________________________________  If you have questions after your visit, please contact our office at [phone] between the hours of 8:30 a.m. and 5:00 p.m. Voicemails left after 4:30 p.m. will not be returned until the following business day. _______________________________________________________________  For prescription refill requests, have your pharmacy contact our office. _______________________________________________________________  Recommendations made by the consultant and any test results will be sent to your referring physician. _______________________________________________________________

## 2014-06-06 ENCOUNTER — Emergency Department (HOSPITAL_COMMUNITY): Payer: Medicare Other

## 2014-06-06 ENCOUNTER — Emergency Department (HOSPITAL_COMMUNITY)
Admission: EM | Admit: 2014-06-06 | Discharge: 2014-06-06 | Disposition: A | Discharge status: Home / Self Care | Payer: Medicare Other | Attending: Emergency Medicine | Admitting: Emergency Medicine

## 2014-06-06 ENCOUNTER — Telehealth (HOSPITAL_COMMUNITY): Payer: Self-pay | Admitting: *Deleted

## 2014-06-06 ENCOUNTER — Encounter (HOSPITAL_COMMUNITY): Payer: Self-pay | Admitting: Emergency Medicine

## 2014-06-06 DIAGNOSIS — F329 Major depressive disorder, single episode, unspecified: Secondary | ICD-10-CM | POA: Insufficient documentation

## 2014-06-06 DIAGNOSIS — R0602 Shortness of breath: Secondary | ICD-10-CM | POA: Insufficient documentation

## 2014-06-06 DIAGNOSIS — K5900 Constipation, unspecified: Secondary | ICD-10-CM | POA: Insufficient documentation

## 2014-06-06 DIAGNOSIS — J449 Chronic obstructive pulmonary disease, unspecified: Secondary | ICD-10-CM | POA: Insufficient documentation

## 2014-06-06 DIAGNOSIS — F419 Anxiety disorder, unspecified: Secondary | ICD-10-CM | POA: Diagnosis not present

## 2014-06-06 DIAGNOSIS — M1990 Unspecified osteoarthritis, unspecified site: Secondary | ICD-10-CM | POA: Diagnosis not present

## 2014-06-06 DIAGNOSIS — Z8546 Personal history of malignant neoplasm of prostate: Secondary | ICD-10-CM | POA: Insufficient documentation

## 2014-06-06 DIAGNOSIS — Z8551 Personal history of malignant neoplasm of bladder: Secondary | ICD-10-CM | POA: Diagnosis not present

## 2014-06-06 DIAGNOSIS — R0781 Pleurodynia: Secondary | ICD-10-CM

## 2014-06-06 DIAGNOSIS — Z87442 Personal history of urinary calculi: Secondary | ICD-10-CM | POA: Insufficient documentation

## 2014-06-06 DIAGNOSIS — R61 Generalized hyperhidrosis: Secondary | ICD-10-CM | POA: Diagnosis not present

## 2014-06-06 DIAGNOSIS — N390 Urinary tract infection, site not specified: Secondary | ICD-10-CM | POA: Diagnosis not present

## 2014-06-06 DIAGNOSIS — Z79899 Other long term (current) drug therapy: Secondary | ICD-10-CM | POA: Insufficient documentation

## 2014-06-06 DIAGNOSIS — K219 Gastro-esophageal reflux disease without esophagitis: Secondary | ICD-10-CM | POA: Insufficient documentation

## 2014-06-06 DIAGNOSIS — Z720 Tobacco use: Secondary | ICD-10-CM | POA: Diagnosis not present

## 2014-06-06 DIAGNOSIS — C3491 Malignant neoplasm of unspecified part of right bronchus or lung: Secondary | ICD-10-CM | POA: Diagnosis not present

## 2014-06-06 DIAGNOSIS — R0789 Other chest pain: Secondary | ICD-10-CM | POA: Insufficient documentation

## 2014-06-06 DIAGNOSIS — R079 Chest pain, unspecified: Secondary | ICD-10-CM | POA: Diagnosis present

## 2014-06-06 LAB — BASIC METABOLIC PANEL
Anion gap: 8 (ref 5–15)
BUN: 12 mg/dL (ref 6–23)
CALCIUM: 9.4 mg/dL (ref 8.4–10.5)
CO2: 30 mmol/L (ref 19–32)
CREATININE: 0.77 mg/dL (ref 0.50–1.35)
Chloride: 103 mmol/L (ref 96–112)
Glucose, Bld: 115 mg/dL — ABNORMAL HIGH (ref 70–99)
Potassium: 3.6 mmol/L (ref 3.5–5.1)
Sodium: 141 mmol/L (ref 135–145)

## 2014-06-06 LAB — URINALYSIS, ROUTINE W REFLEX MICROSCOPIC
Bilirubin Urine: NEGATIVE
Glucose, UA: NEGATIVE mg/dL
Hgb urine dipstick: NEGATIVE
Ketones, ur: NEGATIVE mg/dL
Leukocytes, UA: NEGATIVE
NITRITE: NEGATIVE
PH: 5.5 (ref 5.0–8.0)
UROBILINOGEN UA: 0.2 mg/dL (ref 0.0–1.0)

## 2014-06-06 LAB — CBC
HCT: 40.2 % (ref 39.0–52.0)
HEMOGLOBIN: 13.3 g/dL (ref 13.0–17.0)
MCH: 34.5 pg — AB (ref 26.0–34.0)
MCHC: 33.1 g/dL (ref 30.0–36.0)
MCV: 104.4 fL — ABNORMAL HIGH (ref 78.0–100.0)
PLATELETS: 55 10*3/uL — AB (ref 150–400)
RBC: 3.85 MIL/uL — AB (ref 4.22–5.81)
RDW: 15.5 % (ref 11.5–15.5)
WBC: 6.6 10*3/uL (ref 4.0–10.5)

## 2014-06-06 LAB — URINE MICROSCOPIC-ADD ON

## 2014-06-06 LAB — TROPONIN I: Troponin I: <0.03 ng/mL (ref <0.031)

## 2014-06-06 MED ORDER — IOHEXOL 350 MG/ML SOLN
100.0000 mL | Freq: Once | INTRAVENOUS | Status: AC | PRN
  Administered 2014-06-06: 100 mL via INTRAVENOUS

## 2014-06-06 MED ORDER — LORAZEPAM 1 MG PO TABS
1.0000 mg | ORAL_TABLET | Freq: Once | ORAL | Status: AC
  Administered 2014-06-06: 1 mg via ORAL
  Filled 2014-06-06: qty 1

## 2014-06-06 MED ORDER — OXYCODONE-ACETAMINOPHEN 5-325 MG PO TABS
2.0000 | ORAL_TABLET | Freq: Once | ORAL | Status: AC
  Administered 2014-06-06: 2 via ORAL
  Filled 2014-06-06: qty 2

## 2014-06-06 MED ORDER — CEPHALEXIN 500 MG PO CAPS: 500.0000 mg | ORAL_CAPSULE | Freq: Four times a day (QID) | ORAL | Status: DC

## 2014-06-06 MED ORDER — CEFTRIAXONE SODIUM 1 G IJ SOLR
1.0000 g | Freq: Once | INTRAMUSCULAR | Status: AC
  Administered 2014-06-06: 1 g via INTRAVENOUS
  Filled 2014-06-06: qty 10

## 2014-06-06 NOTE — ED Notes (Signed)
MD Wickline at bedside. 

## 2014-06-06 NOTE — ED Notes (Signed)
X-ray at bedside

## 2014-06-06 NOTE — ED Provider Notes (Signed)
CSN: [id number]     Arrival date & time 06/06/14  1044 History  This chart was scribed for Ripley Fraise, MD by Mercy Moore, ED scribe.  This patient was seen in room APA10/APA10 and the patient's care was started at 11:25 AM.   Chief Complaint  Patient presents with  . Chest Pain   Patient is a 64 y.o. male presenting with chest pain. The history is provided by the patient and the spouse. No language interpreter was used.  Chest Pain Context: not breathing and no trauma   Associated symptoms: diaphoresis, nausea and shortness of breath   Associated symptoms: no cough   Risk factors: smoking   Risk factors: no prior DVT/PE and no surgery    HPI Comments: ANAN DAPOLITO is a 64 y.o. male with PMHx of lung, bladder and prostate cancer who presents to the Emergency Department complaining of sudden chest pain yesterday while lying down. Patient reports development of chest pain after returning home from radiation treatment. Last chemo treatment five days ago. Patient reports sharp, burning chest pain exacerbated with deep breathing. Patient states reports improvement of his chest pain, but states that it has persisted this morning. Patient denies history of MI, PE/DVTs.   Past Medical History  Diagnosis Date  . COPD (chronic obstructive pulmonary disease)   . Inflammatory bowel disease   . Constipation   . Lesion of bladder   . Prostate mass   . Dysuria-frequency syndrome     wears depends  . Arthritis   . Shortness of breath   . Depression   . Anxiety   . Kidney stones   . GERD (gastroesophageal reflux disease)   . Cancer 2015    LT lung, bladder and prostate   Past Surgical History  Procedure Laterality Date  . Back surgery    . Left shoulder      arthroscopy  . Rt knee arthroscopy    . Colonoscopy N/A 01/11/2013    Procedure: COLONOSCOPY;  Surgeon: [name], MD;  [facility];  Service: Endoscopy;  Laterality: N/A;  340  . Carpal tunnel release Right    . Transurethral resection of bladder tumor N/A 09/21/2013    Procedure: TRANSURETHRAL RESECTION  PROSTATE  AND BLADDER ;  Surgeon: [name], MD;  Location: [facility];  Service: Urology;  Laterality: N/A;  . Video bronchoscopy with endobronchial navigation Right 10/25/2013    Procedure: VIDEO BRONCHOSCOPY WITH ENDOBRONCHIAL NAVIGATION;  Surgeon: [name], MD;  Location: [place];  Service: Thoracic;  Laterality: Right;  . Transurethral resection of bladder tumor with gyrus (turbt-gyrus) N/A 01/18/2014    Procedure: TRANSURETHRAL RESECTION OF BLADDER TUMOR WITH GYRUS (TURBT-GYRUS);  Surgeon: [name], MD;  [facility];  Service: Urology;  Laterality: N/A;  . Transurethral resection of prostate N/A 01/18/2014    Procedure: TRANSURETHRAL RESECTION OF THE PROSTATE (TURP);  Surgeon: [name], MD;  Location: [facility];  Service: Urology;  Laterality: N/A;   Family History  Problem Relation Age of Onset  . Diabetes Sister   . Cancer Brother    History  Substance Use Topics  . Smoking status: Current Every Day Smoker -- 1.00 packs/day for 40 years    Types: Cigarettes  . Smokeless tobacco: Never Used     Comment: 1 pack a day since age 66  . Alcohol Use: No    Review of Systems  Constitutional: Positive for diaphoresis.  Respiratory: Positive for shortness  of breath. Negative for cough.   Cardiovascular: Positive for chest pain.  Gastrointestinal: Positive for nausea.  Genitourinary:       Cloudy urine   Neurological: Negative for syncope.  All other systems reviewed and are negative.     Allergies  Lyrica; Gabapentin; and Morphine and related  Home Medications   Prior to Admission medications   Medication Sig Start Date End Date Taking? Authorizing Provider  ALPRAZolam Duanne Moron) 1 MG tablet Take 0.5 mg by mouth every 2 (two) hours.     Historical Provider, MD  Dextromethorphan Polistirex (DELSYM PO) Take 15 mLs by mouth as needed.    Historical  Provider, MD  docusate sodium (COLACE) 100 MG capsule Take 200 mg by mouth at bedtime.    Historical Provider, MD  hydrocortisone (PROCTOZONE-HC) 2.5 % rectal cream Place 1 application rectally 3 (three) times daily as needed for hemorrhoids (swelling).    Historical Provider, MD  lidocaine-prilocaine (EMLA) cream Apply 1 application topically as needed. 03/07/14   Baird Cancer, PA-C  magnesium hydroxide (MILK OF MAGNESIA) 400 MG/5ML suspension Take 30 mLs by mouth daily as needed for mild constipation.    Historical Provider, MD  Meth-Hyo-M Bl-Na Phos-Ph Sal (URIBEL) 118 MG CAPS Take 118 mg by mouth 2 (two) times daily.     Historical Provider, MD  mirtazapine (REMERON) 15 MG tablet Add to 45 mg dose for total of 60 mg daily 05/03/14   Patrici Ranks, MD  mirtazapine (REMERON) 45 MG tablet Take 45 mg by mouth at bedtime.  02/03/14   Historical Provider, MD  morphine (MS CONTIN) 15 MG 12 hr tablet Take 1 tablet (15 mg total) by mouth every 12 (twelve) hours. Patient not taking: Reported on 05/25/2014 02/12/14   Patrici Ranks, MD  Multiple Vitamins-Minerals (MULTIVITAMIN WITH MINERALS) tablet Take 1 tablet by mouth every morning. [employer] Multi Vitamin    Historical Provider, MD  omeprazole (PRILOSEC) 20 MG capsule Take 20 mg by mouth 2 (two) times daily before a meal.    Historical Provider, MD  ondansetron (ZOFRAN) 8 MG tablet Take 1 tablet (8 mg total) by mouth every 8 (eight) hours as needed for nausea or vomiting. 05/03/14   Patrici Ranks, MD  oxyCODONE-acetaminophen (PERCOCET) 10-325 MG per tablet Take 1-2 tablets by mouth every 4 (four) hours as needed for pain. 05/03/14   Patrici Ranks, MD  potassium chloride SA (K-DUR,KLOR-CON) 20 MEQ tablet Take 1 tablet (20 mEq total) by mouth daily. 05/11/14   Baird Cancer, PA-C  PRESCRIPTION MEDICATION On Wednesdays for 21 days Carbo and Gemzar. Dr. Whitney Muse. [facility]    Historical Provider, MD  prochlorperazine (COMPAZINE)  10 MG tablet Take 1 tablet (10 mg total) by mouth every 6 (six) hours as needed for nausea or vomiting. 02/11/14   Patrici Ranks, MD  Propylene Glycol (SYSTANE BALANCE OP) Place 1 drop into both eyes daily.    Historical Provider, MD  senna (SENOKOT) 8.6 MG TABS tablet Take 1 tablet by mouth 3 (three) times daily as needed for mild constipation.    Historical Provider, MD  tamsulosin (FLOMAX) 0.4 MG CAPS capsule Take 1 capsule (0.4 mg total) by mouth daily. 05/03/14   Patrici Ranks, MD  tiotropium (SPIRIVA) 18 MCG inhalation capsule Place 18 mcg into inhaler and inhale every evening.     Historical Provider, MD  traZODone (DESYREL) 150 MG tablet Take 150 mg by mouth at bedtime.  Historical Provider, MD  vitamin C (ASCORBIC ACID) 500 MG tablet Take 500 mg by mouth daily.    Historical Provider, MD   Triage Vitals: BP 109/83 mmHg  Pulse 85  Temp(Src) 98.1 F (36.7 C) (Oral)  Resp 19  Ht '5\' 9"'$  (1.753 m)  Wt 128 lb (58.06 kg)  BMI 18.89 kg/m2  SpO2 93% Physical Exam  Nursing note and vitals reviewed. CONSTITUTIONAL: frail  HEAD: Normocephalic/atraumatic EYES: EOMI/PERRL ENMT: Mucous membranes moist NECK: supple no meningeal signs SPINE/BACK:entire spine nontender CV: S1/S2 noted, no murmurs/rubs/gallops noted LUNGS: decreased breath sounds bilaterally ABDOMEN: soft, nontender, no rebound or guarding, bowel sounds noted throughout abdomen GU:no cva tenderness NEURO: Pt is awake/alert/appropriate, moves all extremitiesx4.  No facial droop.   EXTREMITIES: pulses normal/equal, full ROM; no calf tenderness or edema SKIN: warm, color normal PSYCH: no abnormalities of mood noted, alert and oriented to situation   ED Course  Procedures   Pt with h/o PE, he has pleuritic CP, and mild hypoxia I proceed directly to CT chest CT chest negative for acute disease He has no pneumonia Low suspicion for ACS (CP was pleuritic) I offered admission to patient for CP evaluation and also to  treat his UTI He prefers to go home He has oncology f/u in 2 days He will take oral keflex Oncology is aware he is in the ED Discussed strict return precautions   Medications  oxyCODONE-acetaminophen (PERCOCET/ROXICET) 5-325 MG per tablet 2 tablet (2 tablets Oral Given 06/06/14 1342)  iohexol (OMNIPAQUE) 350 MG/ML injection 100 mL (100 mLs Intravenous Contrast Given 06/06/14 1323)  LORazepam (ATIVAN) tablet 1 mg (1 mg Oral Given 06/06/14 1450)  cefTRIAXone (ROCEPHIN) 1 g in dextrose 5 % 50 mL IVPB (0 g Intravenous Stopped 06/06/14 1526)     COORDINATION OF CARE: 12:00 PM- Discussed treatment plan with patient at bedside and patient agreed to plan.   Labs Review Labs Reviewed  CBC - Abnormal; Notable for the following:    RBC 3.85 (*)    MCV 104.4 (*)    MCH 34.5 (*)    Platelets 55 (*)    All other components within normal limits  BASIC METABOLIC PANEL - Abnormal; Notable for the following:    Glucose, Bld 115 (*)    All other components within normal limits  URINALYSIS, ROUTINE W REFLEX MICROSCOPIC - Abnormal; Notable for the following:    Specific Gravity, Urine >1.030 (*)    Protein, ur TRACE (*)    All other components within normal limits  URINE MICROSCOPIC-ADD ON - Abnormal; Notable for the following:    Squamous Epithelial / LPF FEW (*)    Bacteria, UA MANY (*)    All other components within normal limits  URINE CULTURE  TROPONIN I    Imaging Review Dg Chest Port 1 View  06/06/2014   CLINICAL DATA:  Chest pain and cough for 1 day  EXAM: PORTABLE CHEST - 1 VIEW  COMPARISON:  Chest radiograph March 22, 2014 and chest CT May 17, 2014  FINDINGS: The parenchymal lung mass in the left mid lung is appreciable on this radiographic examination. This mass measures approximately 2.2 x 2.2 cm. There is underlying emphysematous change. There is no new opacity. No edema or consolidation. Heart size is normal. Pulmonary vascularity reflects the underlying emphysema. No adenopathy.  Port-A-Cath tip is in the superior vena cava. No pneumothorax.  IMPRESSION: Left mid lung mass, similar to recent CT. Underlying emphysema. No edema or consolidation. No new opacity. No  pneumothorax.   Electronically Signed   By: Lowella Grip III M.D.   On: 06/06/2014 11:25     EKG Interpretation   Date/Time:  Wednesday June 06 2014 11:08:14 EDT Ventricular Rate:  84 PR Interval:  179 QRS Duration: 73 QT Interval:  375 QTC Calculation: 443 R Axis:   67 Text Interpretation:  Sinus rhythm Minimal ST elevation, inferior leads No  significant change since last tracing Confirmed by Christy Gentles  MD, Quintus Premo  [phone]) on 06/06/2014 11:10:28 AM      MDM   Final diagnoses:  Pleuritic chest pain  UTI (lower urinary tract infection)    Nursing notes including past medical history and social history reviewed and considered in documentation xrays/imaging reviewed by myself and considered during evaluation Labs/vital reviewed myself and considered during evaluation   I personally performed the services described in this documentation, which was scribed in my presence. The recorded information has been reviewed and is accurate.        Ripley Fraise, MD 06/06/14 [phone]

## 2014-06-06 NOTE — Telephone Encounter (Signed)
Wife called saying that Brian Sims was having chest pain and sweating and urinating with some burning pt instructed to go to the ER

## 2014-06-06 NOTE — ED Notes (Signed)
Pt reports chest pain since yesterday after radiation. Pt denies any change in pain with a deep breath or movement. Pt reports nausea and sweating last night and this morning. nad noted at this time.

## 2014-06-06 NOTE — ED Notes (Signed)
Pt c/o of SOB. O2 sats 88-90%. Placed pt on 2L nasal cannula. Sats 97% at this time.

## 2014-06-07 LAB — URINE CULTURE
Colony Count: NO GROWTH
Culture: NO GROWTH

## 2014-06-08 ENCOUNTER — Inpatient Hospital Stay (HOSPITAL_COMMUNITY): Payer: Self-pay

## 2014-06-08 ENCOUNTER — Encounter (HOSPITAL_BASED_OUTPATIENT_CLINIC_OR_DEPARTMENT_OTHER): Payer: Medicare Other | Admitting: Hematology & Oncology

## 2014-06-08 ENCOUNTER — Ambulatory Visit (HOSPITAL_COMMUNITY): Payer: Self-pay | Admitting: Hematology & Oncology

## 2014-06-08 ENCOUNTER — Encounter (HOSPITAL_COMMUNITY): Payer: Self-pay | Admitting: Hematology & Oncology

## 2014-06-08 ENCOUNTER — Encounter (HOSPITAL_COMMUNITY): Payer: Medicare Other

## 2014-06-08 VITALS — BP 113/76 | HR 65 | Temp 98.0°F | Resp 16 | Wt 126.8 lb

## 2014-06-08 DIAGNOSIS — C672 Malignant neoplasm of lateral wall of bladder: Secondary | ICD-10-CM

## 2014-06-08 DIAGNOSIS — Z452 Encounter for adjustment and management of vascular access device: Secondary | ICD-10-CM

## 2014-06-08 DIAGNOSIS — N390 Urinary tract infection, site not specified: Secondary | ICD-10-CM

## 2014-06-08 DIAGNOSIS — C3492 Malignant neoplasm of unspecified part of left bronchus or lung: Secondary | ICD-10-CM

## 2014-06-08 DIAGNOSIS — F329 Major depressive disorder, single episode, unspecified: Secondary | ICD-10-CM | POA: Diagnosis not present

## 2014-06-08 DIAGNOSIS — C3432 Malignant neoplasm of lower lobe, left bronchus or lung: Secondary | ICD-10-CM | POA: Diagnosis not present

## 2014-06-08 DIAGNOSIS — C679 Malignant neoplasm of bladder, unspecified: Secondary | ICD-10-CM | POA: Diagnosis not present

## 2014-06-08 LAB — CBC WITH DIFFERENTIAL/PLATELET
Basophils Absolute: 0 10*3/uL (ref 0.0–0.1)
Basophils Relative: 1 % (ref 0–1)
EOS PCT: 2 % (ref 0–5)
Eosinophils Absolute: 0.1 10*3/uL (ref 0.0–0.7)
HCT: 35.6 % — ABNORMAL LOW (ref 39.0–52.0)
HEMOGLOBIN: 11.9 g/dL — AB (ref 13.0–17.0)
LYMPHS ABS: 0.9 10*3/uL (ref 0.7–4.0)
LYMPHS PCT: 22 % (ref 12–46)
MCH: 34.8 pg — ABNORMAL HIGH (ref 26.0–34.0)
MCHC: 33.4 g/dL (ref 30.0–36.0)
MCV: 104.1 fL — AB (ref 78.0–100.0)
Monocytes Absolute: 0.6 10*3/uL (ref 0.1–1.0)
Monocytes Relative: 14 % — ABNORMAL HIGH (ref 3–12)
Neutro Abs: 2.4 10*3/uL (ref 1.7–7.7)
Neutrophils Relative %: 61 % (ref 43–77)
PLATELETS: 49 10*3/uL — AB (ref 150–400)
RBC: 3.42 MIL/uL — ABNORMAL LOW (ref 4.22–5.81)
RDW: 15.1 % (ref 11.5–15.5)
WBC: 4 10*3/uL (ref 4.0–10.5)

## 2014-06-08 MED ORDER — OXYCODONE-ACETAMINOPHEN 10-325 MG PO TABS: 1.0000 | ORAL_TABLET | ORAL | Status: DC | PRN

## 2014-06-08 MED ORDER — FLUCONAZOLE 150 MG PO TABS: ORAL_TABLET | ORAL | Status: DC

## 2014-06-08 MED ORDER — HEPARIN SOD (PORK) LOCK FLUSH 100 UNIT/ML IV SOLN
500.0000 [IU] | Freq: Once | INTRAVENOUS | Status: AC
  Administered 2014-06-08: 500 [IU] via INTRAVENOUS

## 2014-06-08 MED ORDER — SODIUM CHLORIDE 0.9 % IJ SOLN
10.0000 mL | INTRAMUSCULAR | Status: DC | PRN
  Administered 2014-06-08: 10 mL via INTRAVENOUS
  Filled 2014-06-08: qty 10

## 2014-06-08 NOTE — Patient Instructions (Signed)
[place] at [facility] Discharge Instructions  RECOMMENDATIONS MADE BY THE CONSULTANT AND ANY TEST RESULTS WILL BE SENT TO YOUR REFERRING PHYSICIAN.  Exam and discussion by Dr. Whitney Muse Your platelet count is too low for treatment today so we will defer it until next week. Percocet for pain - take as directed.  Follow-up in 1 week  Thank you for choosing [place] at [facility] to provide your oncology and hematology care.  To afford each patient quality time with our provider, please arrive at least 15 minutes before your scheduled appointment time.    You need to re-schedule your appointment should you arrive 10 or more minutes late.  We strive to give you quality time with our providers, and arriving late affects you and other patients whose appointments are after yours.  Also, if you no show three or more times for appointments you may be dismissed from the clinic at the providers discretion.     Again, thank you for choosing [facility].  Our hope is that these requests will decrease the amount of time that you wait before being seen by our physicians.       _____________________________________________________________  Should you have questions after your visit to [facility], please contact our office at [phone] between the hours of 8:30 a.m. and 4:30 p.m.  Voicemails left after 4:30 p.m. will not be returned until the following business day.  For prescription refill requests, have your pharmacy contact our office.

## 2014-06-08 NOTE — Progress Notes (Signed)
Brian Sims Chemotherapy being held due to platelet count

## 2014-06-08 NOTE — Progress Notes (Addendum)
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 He has done suprisingly well with XRT. Today he actually has few complaints. He has some urinary frequency. He denies dysuria or hematuria.  He was sent to the ED on 4/27 for chest pain. After evaluation it was felt to be pleuritic chest pain. He was also noted to have a UTI. They are requesting diflucan as he tends to get "thrush" after he takes antibiotics.     Squamous cell carcinoma of left lung   10/25/2013 Initial Diagnosis T1N0 SCCA of the left lower lobe    Radiation Therapy    05/17/2014 Imaging CT chest- Persistent but improved appearance of left lower lobe lung mass. Small pulmonary nodules in the right lung are unchanged. No new or progressive disease identified.    Bladder cancer   01/18/2014 Initial Diagnosis 1. Bladder, biopsy, right lateral wall of bladder - UROTHELIAL CARCINOMA IN SITU, NO STROMAL INVASION IDENTIFIED.  3. Prostate, TUR, deep prostatic tissue - HIGH GRADE UROTHELIAL CARCINOMA WITH ASSOCIATED TUMOR NECROSIS, INVOLVING PROSTATIC DUCTS    02/14/2014 - 05/03/2014 Chemotherapy Carboplatin/Gemzar   05/09/2014 -  Radiation Therapy    05/11/2014 Treatment Plan Change Change chemotherapy to weekly Carboplatin (AUC 2)   05/11/2014 -  Chemotherapy Weekly Carboplatin with XRT at  AUC 2.     Past Medical History  Diagnosis Date  . COPD (chronic obstructive pulmonary disease)   . Inflammatory bowel disease   . Constipation   . Lesion of  bladder   . Prostate mass   . Dysuria-frequency syndrome     wears depends  . Arthritis   . Shortness of breath   . Depression   . Anxiety   . Kidney stones   . GERD (gastroesophageal reflux disease)   . Cancer 2015    LT lung, bladder and prostate    has IBS (irritable bowel syndrome); Back pain; Heme positive stool; Squamous cell carcinoma of left lung; Bladder cancer; Tobacco abuse; Hematuria; and Neoplasm related pain on his problem list.     is allergic to lyrica; gabapentin; and morphine and related.  Brian Sims had no medications administered during this visit.  Past Surgical History  Procedure Laterality Date  . Back surgery    . Left shoulder      arthroscopy  . Rt knee arthroscopy    . Colonoscopy N/A 01/11/2013    Procedure: COLONOSCOPY;  Surgeon: [name], MD;  [facility];  Service: Endoscopy;  Laterality: N/A;  340  . Carpal tunnel release Right   . Transurethral resection of bladder tumor N/A 09/21/2013    Procedure: TRANSURETHRAL RESECTION  PROSTATE  AND BLADDER ;  Surgeon: [name], MD;  Location: [facility];  Service: Urology;  Laterality: N/A;  . Video bronchoscopy with endobronchial navigation Right 10/25/2013    Procedure: VIDEO BRONCHOSCOPY WITH ENDOBRONCHIAL NAVIGATION;  Surgeon: [name], MD;  Location: [place];  Service: Thoracic;  Laterality: Right;  . Transurethral resection of bladder tumor with gyrus (turbt-gyrus) N/A  01/18/2014    Procedure: TRANSURETHRAL RESECTION OF BLADDER TUMOR WITH GYRUS (TURBT-GYRUS);  Surgeon: [name], MD;  [facility];  Service: Urology;  Laterality: N/A;  . Transurethral resection of prostate N/A 01/18/2014    Procedure: TRANSURETHRAL RESECTION OF THE PROSTATE (TURP);  Surgeon: [name], MD;  [facility];  Service: Urology;  Laterality: N/A;    Denies any headaches, dizziness, double vision, fevers, chills, night sweats, nausea, vomiting, diarrhea, constipation,  chest pain, heart palpitations, shortness of breath, blood in stool, black tarry stool, urinary pain, urinary burning, urinary frequency, hematuria.   PHYSICAL EXAMINATION  ECOG PERFORMANCE STATUS: 1 - Symptomatic but completely ambulatory  There were no vitals filed for this visit.  GENERAL:alert, no distress, comfortable, cooperative and chronically ill appearing, accompanied by wife and son. SKIN: skin color, texture, turgor are normal, no rashes or significant lesions HEAD: Normocephalic, No masses, lesions, tenderness or abnormalities EYES: normal, PERRLA EARS: External ears normal OROPHARYNX:lips, buccal mucosa, and tongue normal and mucous membranes are moist  NECK: supple, trachea midline LYMPH: no palpable adenopathy in the neck or supraclavicular regions BREAST:not examined LUNGS: decreased breath sounds HEART: regular rate & rhythm, no murmurs and no gallops ABDOMEN:abdomen soft and normal bowel sounds, wearing adult depends BACK: Back symmetric, no curvature. EXTREMITIES:less then 2 second capillary refill, no joint deformities, effusion, or inflammation, no skin discoloration, no cyanosis  NEURO: alert & oriented x 3 with fluent speech, no focal motor/sensory deficits   RADIOLOGY: CLINICAL DATA: Followup lung cancer  EXAM: CT CHEST WITH CONTRAST  TECHNIQUE: Multidetector CT imaging of the chest was performed during intravenous contrast administration.  CONTRAST: 23m OMNIPAQUE IOHEXOL 300 MG/ML SOLN  COMPARISON: 10/05/2013 IMPRESSION: 1. Persistent but improved appearance of left lower lobe lung mass. 2. Small pulmonary nodules in the right lung are unchanged. 3. No new or progressive disease identified.   Electronically Signed  By: TKerby MoorsM.D.  On: 05/17/2014 16:20  LABORATORY DATA: CBC  Results for Brian Sims(MRN [id number]   Ref. Range 06/08/2014 12:30  WBC Latest Ref Range: 4.0-10.5 K/uL 4.0  RBC Latest Ref Range:  4.22-5.81 MIL/uL 3.42 (L)  Hemoglobin Latest Ref Range: 13.0-17.0 g/dL 11.9 (L)  HCT Latest Ref Range: 39.0-52.0 % 35.6 (L)  MCV Latest Ref Range: 78.0-100.0 fL 104.1 (H)  MCH Latest Ref Range: 26.0-34.0 pg 34.8 (H)  MCHC Latest Ref Range: 30.0-36.0 g/dL 33.4  RDW Latest Ref Range: 11.5-15.5 % 15.1  Platelets Latest Ref Range: 150-400 K/uL 49 (L)  Neutrophils Latest Ref Range: 43-77 % 61  Lymphocytes Latest Ref Range: 12-46 % 22  Monocytes Relative Latest Ref Range: 3-12 % 14 (H)  Eosinophil Latest Ref Range: 0-5 % 2  Basophil Latest Ref Range: 0-1 % 1  NEUT# Latest Ref Range: 1.7-7.7 K/uL 2.4  Lymphocyte # Latest Ref Range: 0.7-4.0 K/uL 0.9  Monocyte # Latest Ref Range: 0.1-1.0 K/uL 0.6  Eosinophils Absolute Latest Ref Range: 0.0-0.7 K/uL 0.1  Basophils Absolute Latest Ref Range: 0.0-0.1 K/uL 0.0     ASSESSMENT AND PLAN:  Depression I believe this is a significant component of many of his problems. He is finally taking his trazodone as prescribed and is sleeping better. He is taking Remeron regularly.He had an appointment with behavioral health but did not go. I again encouraged him to please keep the appointments we try to schedule for him. We will continue to work with this issue moving forward.  Pelvic pain Stable with no new compalints.  Stage  I non-small cell lung cancer  CT of the chest performed on 05/17/2014 showed improvement in the pulmonary nodule.  I again advised the patient and his wife that the chemotherapy drugs we have used also will benefit his lung cancer. I advised him that the findings on CT imaging were very positive.  Bladder cancer  He has done better with therapy and I anticipated. I reassured the patient and his wife that having problems with blood counts during radiation to the pelvis is not uncommon. He will get chemotherapy weekly when his blood counts permit. In regards to additional follow-up he will need a referral back to urology for a cystoscopy as  his disease is not seen on imaging.  UTI  To complete his antibiotics. I have given him some Diflucan to take should he develop oral thrush per their request.  All questions were answered. The patient knows to call the clinic with any problems, questions or concerns. We can certainly see the patient much sooner if necessary.  This note is electronically signed  I have reviewed the above documentation for accuracy and completeness and I agree with the above.  This document serves as a record of services personally performed by Ancil Linsey, MD. It was created on her behalf by Pearlie Oyster, a trained medical scribe. The creation of this record is based on the scribe's personal observations and the provider's statements to them. This document has been checked and approved by the attending provider.    Molli Hazard, MD

## 2014-06-08 NOTE — Progress Notes (Signed)
Brian Sims presented for [employer]. Labs per MD order drawn via Portacath located in the right chest wall accessed with  H 20 needle per Shellia Carwin, RN Good blood return present. Procedure without incident.  Portacath flushed with 42m NS and 500U/573mHeparin per protocol and needle removed intact per SuMickie KayRN Patient tolerated procedure well.

## 2014-06-15 ENCOUNTER — Encounter (HOSPITAL_COMMUNITY): Payer: Medicare Other | Attending: Oncology | Admitting: Oncology

## 2014-06-15 ENCOUNTER — Encounter (HOSPITAL_COMMUNITY): Payer: Self-pay | Admitting: Oncology

## 2014-06-15 ENCOUNTER — Encounter (HOSPITAL_COMMUNITY): Payer: Self-pay | Admitting: Hematology & Oncology

## 2014-06-15 ENCOUNTER — Encounter (HOSPITAL_COMMUNITY): Payer: Medicare Other

## 2014-06-15 VITALS — BP 108/67 | HR 62 | Temp 98.2°F | Resp 16 | Wt 127.9 lb

## 2014-06-15 DIAGNOSIS — Z720 Tobacco use: Secondary | ICD-10-CM | POA: Diagnosis not present

## 2014-06-15 DIAGNOSIS — C679 Malignant neoplasm of bladder, unspecified: Secondary | ICD-10-CM | POA: Insufficient documentation

## 2014-06-15 DIAGNOSIS — Z95828 Presence of other vascular implants and grafts: Secondary | ICD-10-CM

## 2014-06-15 DIAGNOSIS — R319 Hematuria, unspecified: Secondary | ICD-10-CM | POA: Insufficient documentation

## 2014-06-15 DIAGNOSIS — C3492 Malignant neoplasm of unspecified part of left bronchus or lung: Secondary | ICD-10-CM

## 2014-06-15 LAB — CBC WITH DIFFERENTIAL/PLATELET
BASOS ABS: 0 10*3/uL (ref 0.0–0.1)
Basophils Relative: 0 % (ref 0–1)
EOS PCT: 1 % (ref 0–5)
Eosinophils Absolute: 0 10*3/uL (ref 0.0–0.7)
HCT: 36.1 % — ABNORMAL LOW (ref 39.0–52.0)
Hemoglobin: 12 g/dL — ABNORMAL LOW (ref 13.0–17.0)
LYMPHS PCT: 27 % (ref 12–46)
Lymphs Abs: 0.9 10*3/uL (ref 0.7–4.0)
MCH: 34.8 pg — ABNORMAL HIGH (ref 26.0–34.0)
MCHC: 33.2 g/dL (ref 30.0–36.0)
MCV: 104.6 fL — AB (ref 78.0–100.0)
Monocytes Absolute: 0.5 10*3/uL (ref 0.1–1.0)
Monocytes Relative: 15 % — ABNORMAL HIGH (ref 3–12)
NEUTROS ABS: 1.9 10*3/uL (ref 1.7–7.7)
NEUTROS PCT: 57 % (ref 43–77)
PLATELETS: 36 10*3/uL — AB (ref 150–400)
RBC: 3.45 MIL/uL — AB (ref 4.22–5.81)
RDW: 15.4 % (ref 11.5–15.5)
WBC: 3.2 10*3/uL — ABNORMAL LOW (ref 4.0–10.5)

## 2014-06-15 LAB — COMPREHENSIVE METABOLIC PANEL
ALT: 9 U/L — AB (ref 17–63)
AST: 17 U/L (ref 15–41)
Albumin: 3.8 g/dL (ref 3.5–5.0)
Alkaline Phosphatase: 50 U/L (ref 38–126)
Anion gap: 6 (ref 5–15)
BILIRUBIN TOTAL: 0.4 mg/dL (ref 0.3–1.2)
BUN: 12 mg/dL (ref 6–20)
CO2: 31 mmol/L (ref 22–32)
Calcium: 9.3 mg/dL (ref 8.9–10.3)
Chloride: 104 mmol/L (ref 101–111)
Creatinine, Ser: 0.77 mg/dL (ref 0.61–1.24)
GFR calc Af Amer: >60 mL/min (ref >60)
GLUCOSE: 99 mg/dL (ref 70–99)
Potassium: 3.8 mmol/L (ref 3.5–5.1)
SODIUM: 141 mmol/L (ref 135–145)
Total Protein: 6.6 g/dL (ref 6.5–8.1)

## 2014-06-15 MED ORDER — HEPARIN SOD (PORK) LOCK FLUSH 100 UNIT/ML IV SOLN
500.0000 [IU] | Freq: Once | INTRAVENOUS | Status: AC
  Administered 2014-06-15: 500 [IU] via INTRAVENOUS

## 2014-06-15 MED ORDER — SODIUM CHLORIDE 0.9 % IJ SOLN
10.0000 mL | Freq: Once | INTRAMUSCULAR | Status: AC
  Administered 2014-06-15: 10 mL via INTRAVENOUS

## 2014-06-15 MED ORDER — HEPARIN SOD (PORK) LOCK FLUSH 100 UNIT/ML IV SOLN
INTRAVENOUS | Status: AC
  Filled 2014-06-15: qty 5

## 2014-06-15 NOTE — Patient Instructions (Addendum)
[place] at [facility] Discharge Instructions  RECOMMENDATIONS MADE BY THE CONSULTANT AND ANY TEST RESULTS WILL BE SENT TO YOUR REFERRING PHYSICIAN.  Exam and discussion by Robynn Pane, PA-C. Report fevers, uncontrolled nausea, vomiting, or other concerns. Treatment deferred due to low platelet count.  Follow-up in 1 week.  Thank you for choosing [place] at [facility] to provide your oncology and hematology care.  To afford each patient quality time with our provider, please arrive at least 15 minutes before your scheduled appointment time.    You need to re-schedule your appointment should you arrive 10 or more minutes late.  We strive to give you quality time with our providers, and arriving late affects you and other patients whose appointments are after yours.  Also, if you no show three or more times for appointments you may be dismissed from the clinic at the providers discretion.     Again, thank you for choosing [facility].  Our hope is that these requests will decrease the amount of time that you wait before being seen by our physicians.       _____________________________________________________________  Should you have questions after your visit to [facility], please contact our office at [phone] between the hours of 8:30 a.m. and 4:30 p.m.  Voicemails left after 4:30 p.m. will not be returned until the following business day.  For prescription refill requests, have your pharmacy contact our office.

## 2014-06-15 NOTE — Progress Notes (Signed)
Brian Bogus, MD [address] [place] [address]  Malignant neoplasm of urinary bladder, unspecified site  CURRENT THERAPY: Carboplatin/Gemzar C1D1 started on 02/14/2014 neo-adjuvantly with a change in treatment to concomitant chemoradiation with weekly Carboplatin.  INTERVAL HISTORY: Brian Sims 64 y.o. 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     Squamous cell carcinoma of left lung   10/25/2013 Initial Diagnosis T1N0 SCCA of the left lower lobe    Radiation Therapy    05/17/2014 Imaging CT chest- Persistent but improved appearance of left lower lobe lung mass. Small pulmonary nodules in the right lung are unchanged. No new or progressive disease identified.    Bladder cancer   01/18/2014 Initial Diagnosis 1. Bladder, biopsy, right lateral wall of bladder - UROTHELIAL CARCINOMA IN SITU, NO STROMAL INVASION IDENTIFIED.  3. Prostate, TUR, deep prostatic tissue - HIGH GRADE UROTHELIAL CARCINOMA WITH ASSOCIATED TUMOR NECROSIS, INVOLVING PROSTATIC DUCTS    02/14/2014 - 05/03/2014 Chemotherapy Carboplatin/Gemzar   05/09/2014 -  Radiation Therapy    05/11/2014 Treatment Plan Change Change chemotherapy to weekly Carboplatin (AUC 2)   05/11/2014 -  Chemotherapy Weekly Carboplatin with XRT at  AUC 2.   I personally reviewed and went over laboratory results with the patient.  The results are noted within this dictation.  I reviewed the future plan with the patient and family.  They are pleased to know that Brian Sims will get a break from chemotherapy following the completion of radiation.  I foreshadowed his future treatment plan over the next 6 weeks or so.   I spoke with Dr. [name] today and she reports 8-10 more treatments of radiation left.  She is providing him a boost in treatment.    Oncologically, he is doing well.  He denies any complaints today that are new.  Past Medical History  Diagnosis Date  . COPD  (chronic obstructive pulmonary disease)   . Inflammatory bowel disease   . Constipation   . Lesion of bladder   . Prostate mass   . Dysuria-frequency syndrome     wears depends  . Arthritis   . Shortness of breath   . Depression   . Anxiety   . Kidney stones   . GERD (gastroesophageal reflux disease)   . Cancer 2015    LT lung, bladder and prostate    has IBS (irritable bowel syndrome); Back pain; Heme positive stool; Squamous cell carcinoma of left lung; Bladder cancer; Tobacco abuse; Hematuria; and Neoplasm related pain on his problem list.     is allergic to lyrica; gabapentin; and morphine and related.  Brian Sims does not currently have medications on file.  Past Surgical History  Procedure Laterality Date  . Back surgery    . Left shoulder      arthroscopy  . Rt knee arthroscopy    . Colonoscopy N/A 01/11/2013    Procedure: COLONOSCOPY;  Surgeon: [name], MD;  Location: [facility];  Service: Endoscopy;  Laterality: N/A;  340  . Carpal tunnel release Right   . Transurethral resection of bladder tumor N/A 09/21/2013    Procedure: TRANSURETHRAL RESECTION  PROSTATE  AND BLADDER ;  Surgeon: [name], MD;  Location: [facility];  Service: Urology;  Laterality: N/A;  . Video bronchoscopy with endobronchial navigation Right 10/25/2013    Procedure: VIDEO BRONCHOSCOPY WITH ENDOBRONCHIAL NAVIGATION;  Surgeon: [name], MD;  Location: [facility];  Service: Thoracic;  Laterality: Right;  . Transurethral resection of bladder tumor with gyrus (turbt-gyrus) N/A 01/18/2014    Procedure: TRANSURETHRAL RESECTION OF BLADDER TUMOR WITH GYRUS (TURBT-GYRUS);  Surgeon: [name], MD;  Location: [facility];  Service: Urology;  Laterality: N/A;  . Transurethral resection of prostate N/A 01/18/2014    Procedure: TRANSURETHRAL RESECTION OF THE PROSTATE (TURP);  Surgeon: [name], MD;  Location: [facility];  Service: Urology;  Laterality: N/A;    Denies any  headaches, dizziness, double vision, fevers, chills, night sweats, nausea, vomiting, diarrhea, constipation, chest pain, heart palpitations, shortness of breath, blood in stool, black tarry stool, urinary pain, urinary burning, urinary frequency, hematuria.   PHYSICAL EXAMINATION  ECOG PERFORMANCE STATUS: 1 - Symptomatic but completely ambulatory  Filed Vitals:   06/15/14 1207  BP: 108/67  Pulse: 62  Temp: 98.2 F (36.8 C)  Resp: 16    GENERAL:alert, no distress, well nourished, well developed, comfortable, cooperative and accompanied by son and wife, chronically ill appearing. SKIN: skin color, texture, turgor are normal, no rashes or significant lesions HEAD: Normocephalic, No masses, lesions, tenderness or abnormalities EYES: normal, PERRLA, EOMI, Conjunctiva are pink and non-injected EARS: External ears normal OROPHARYNX:lips, buccal mucosa, and tongue normal and mucous membranes are moist  NECK: supple, trachea midline LYMPH:  not examined BREAST:not examined LUNGS: not examined HEART: not examined ABDOMEN:not examined BACK: Back symmetric, no curvature. EXTREMITIES:less then 2 second capillary refill, no joint deformities, effusion, or inflammation, no skin discoloration  NEURO: alert & oriented x 3 with fluent speech, no focal motor/sensory deficits, gait normal, blunted affect    LABORATORY DATA: CBC    Component Value Date/Time   WBC 3.2* 06/15/2014 1257   RBC 3.45* 06/15/2014 1257   HGB 12.0* 06/15/2014 1257   HCT 36.1* 06/15/2014 1257   PLT 36* 06/15/2014 1257   MCV 104.6* 06/15/2014 1257   MCH 34.8* 06/15/2014 1257   MCHC 33.2 06/15/2014 1257   RDW 15.4 06/15/2014 1257   LYMPHSABS 0.9 06/15/2014 1257   MONOABS 0.5 06/15/2014 1257   EOSABS 0.0 06/15/2014 1257   BASOSABS 0.0 06/15/2014 1257      Chemistry      Component Value Date/Time   NA 141 06/15/2014 1257   K 3.8 06/15/2014 1257   CL 104 06/15/2014 1257   CO2 31 06/15/2014 1257   BUN 12  06/15/2014 1257   CREATININE 0.77 06/15/2014 1257      Component Value Date/Time   CALCIUM 9.3 06/15/2014 1257   ALKPHOS 50 06/15/2014 1257   AST 17 06/15/2014 1257   ALT 9* 06/15/2014 1257   BILITOT 0.4 06/15/2014 1257      RADIOGRAPHIC STUDIES:  Ct Chest W Contrast  05/17/2014   CLINICAL DATA:  Followup lung cancer  EXAM: CT CHEST WITH CONTRAST  TECHNIQUE: Multidetector CT imaging of the chest was performed during intravenous contrast administration.  CONTRAST:  12m OMNIPAQUE IOHEXOL 300 MG/ML  SOLN  COMPARISON:  10/05/2013  FINDINGS: Mediastinum: The heart size appears within normal limits. There is no pericardial effusion identified. The trachea is patent and appears midline. Mild circumferential wall thickening involving the mid and distal esophagus identified. No enlarged mediastinal or hilar lymph nodes. There is no supraclavicular or axillary adenopathy. Atherosclerotic disease involves the thoracic aorta as well as the LAD, left circumflex and RCA Coronary arteries.  Lungs/Pleura: No pleural effusion identified. Moderate changes of centrilobular and paraseptal emphysema noted. Tumor within the left lower lobe measures 1.5 x 1.9 cm,  image 38/ series 2. Previously 2.5 x 2.7 cm. There is surrounding scarring and fibrosis consistent with changes of external beam radiation. Small right lower lobe nodule is stable measuring 3 mm, image 34/series 3. Stable 3 mm nodule in the right lower lobe, image 55/series 3. Unchanged 3 mm right upper lobe nodule, image 23/series 3.  Upper Abdomen: 7 mm low attenuation structure within the left hepatic lobe is unchanged, image 57/series 2. Unremarkable appearance of the adrenal glands.  Musculoskeletal: Degenerative disc disease is noted within the lumbar spine. There are no aggressive lytic or sclerotic bone lesions identified.  IMPRESSION: 1. Persistent but improved appearance of left lower lobe lung mass. 2. Small pulmonary nodules in the right lung are  unchanged. 3. No new or progressive disease identified.   Electronically Signed   By: Kerby Moors M.D.   On: 05/17/2014 16:20   Ct Angio Chest Pe W/cm &/or Wo Cm  06/06/2014   CLINICAL DATA:  pleuritic cp started yesterday after rad tx; sob and nausea last chemo treatment 5 days ago; hx left lung ca, bladder ca and prostate 2015  EXAM: CT ANGIOGRAPHY CHEST WITH CONTRAST  TECHNIQUE: Multidetector CT imaging of the chest was performed using the standard protocol during bolus administration of intravenous contrast. Multiplanar CT image reconstructions and MIPs were obtained to evaluate the vascular anatomy.  CONTRAST:  158m OMNIPAQUE IOHEXOL 350 MG/ML SOLN  COMPARISON:  05/17/2014  FINDINGS: Angiographic study: No evidence of a pulmonary embolus. Thoracic aorta is normal in caliber. No dissection.  Thoracic inlet:  No masses or adenopathy.  Mediastinum and hila: Heart normal in size and configuration. Mild coronary artery calcifications. No mediastinal or hilar masses or pathologically enlarged lymph nodes.  Lungs and pleura: Left lower lobe pulmonary nodule measures 2.2 cm x 2 cm x 1.4 cm in size, previously 19 mm x 16 mm x 17 mm. Adjacent discoid and reticular type opacity is consistent with atelectasis/postobstructive change. Other small nodules described previously are either stable or not visualized on the current exam. Changes of emphysema are stable. No lung consolidation or edema. Minimal left pleural effusion. No pneumothorax.  Limited upper abdomen: Small low-density lesion in the lateral segment of the left liver lobe, stable, consistent with a cyst. No adrenal masses. Stable upper pole left renal parenchymal calcification.  Musculoskeletal:  No osteoblastic or osteolytic lesions.  Review of the MIP images confirms the above findings.  IMPRESSION: 1. No evidence of a pulmonary embolus. 2. Slight increase in the size of the left lower lobe pulmonary mass when compared to the recent prior chest CT. 3.  Small pulmonary nodules noted previously are without change. No new nodules. 4. No acute cardiopulmonary disease.   Electronically Signed   By: DLajean ManesM.D.   On: 06/06/2014 13:53   Dg Chest Port 1 View  06/06/2014   CLINICAL DATA:  Chest pain and cough for 1 day  EXAM: PORTABLE CHEST - 1 VIEW  COMPARISON:  Chest radiograph March 22, 2014 and chest CT May 17, 2014  FINDINGS: The parenchymal lung mass in the left mid lung is appreciable on this radiographic examination. This mass measures approximately 2.2 x 2.2 cm. There is underlying emphysematous change. There is no new opacity. No edema or consolidation. Heart size is normal. Pulmonary vascularity reflects the underlying emphysema. No adenopathy. Port-A-Cath tip is in the superior vena cava. No pneumothorax.  IMPRESSION: Left mid lung mass, similar to recent CT. Underlying emphysema. No edema or consolidation. No new opacity.  No pneumothorax.   Electronically Signed   By: Lowella Grip III M.D.   On: 06/06/2014 11:25      ASSESSMENT AND PLAN:  Bladder cancer High grade urothelial carcinoma with prostate invasion. S/P neoadjuvant systemic chemotherapy beginning in Jan 2016 with Carboplatin/Gemzar with better than expected tolerance.  Clinically, he demonstrated an excellent response and therefore, his treatment was changed to concomitant chemoradiation with weekly Carboplatin.  I spoke with Dr. [name] today and she reports that he has 8-10 more tx until complete.  I will add an additional treatment cycle if his labs meet parameters today.  If his labs do not meet parameters, I will defer treatment an additional week.  He was held last week due to thrombocytopenia.  Since his disease cannot be followed radiographically, we will refer him to his urologist for consideration of cystoscopy to help evaluate response to therapy.  We will provide him a drug holiday and anticipate cystoscopy in 5-6 weeks to allow for response to therapy.     We will see him back in 6 weeks following cystoscopy to review data and discuss future treatment/surveillance options/recommendations.    THERAPY PLAN:  Following completion of chemoradiation, we wil refer the patient to his urologist, Dr. Jeffie Pollock, for cystoscopy 4 weeks out from treatment for evaluation of response to therapy.  All questions were answered. The patient knows to call the clinic with any problems, questions or concerns. We can certainly see the patient much sooner if necessary.  Patient and plan discussed with Dr. Ancil Linsey and she is in agreement with the aforementioned.   This note is electronically signed by: Robynn Pane 06/15/2014 2:23 PM

## 2014-06-15 NOTE — Assessment & Plan Note (Addendum)
High grade urothelial carcinoma with prostate invasion. S/P neoadjuvant systemic chemotherapy beginning in Jan 2016 with Carboplatin/Gemzar with better than expected tolerance.  Clinically, he demonstrated an excellent response and therefore, his treatment was changed to concomitant chemoradiation with weekly Carboplatin.  I spoke with Dr. [name] today and she reports that he has 8-10 more tx until complete.  I will add an additional treatment cycle if his labs meet parameters today.  If his labs do not meet parameters, I will defer treatment an additional week.  He was held last week due to thrombocytopenia.  Since his disease cannot be followed radiographically, we will refer him to his urologist for consideration of cystoscopy to help evaluate response to therapy.  We will provide him a drug holiday and anticipate cystoscopy in 5-6 weeks to allow for response to therapy.    Platelet count does not meet treatment parameters today and therefore, day 1 cycle 4 will be deferred x 1 week.  Return in 1 week for follow-up.

## 2014-06-22 ENCOUNTER — Inpatient Hospital Stay (HOSPITAL_COMMUNITY): Payer: Self-pay

## 2014-06-22 ENCOUNTER — Ambulatory Visit (HOSPITAL_COMMUNITY): Payer: Self-pay | Admitting: Oncology

## 2014-06-22 ENCOUNTER — Other Ambulatory Visit (HOSPITAL_COMMUNITY): Payer: Self-pay | Admitting: Oncology

## 2014-07-17 ENCOUNTER — Encounter: Payer: Self-pay | Admitting: *Deleted

## 2014-07-17 NOTE — Progress Notes (Signed)
[place] Clinical Social Work  Clinical Social Work was referred by Pension scheme manager for assessment of psychosocial needs due to financial concerns.  Clinical Social Worker contacted patient's wife at home to offer support and assess for needs.  CSW and family has met with pt and wife several times about cancer policy. Financial counselor has met with them and educated family as well. CSW listened to wife discuss concerns and will meet with them at next visit to review issues more in detail. Wife stated understanding and was appreciative.   Clinical Social Work interventions: Education  Loren Racer, [place] Tuesdays 8:30-1pm Wednesdays 8:30-12pm  [phone]

## 2014-07-18 ENCOUNTER — Ambulatory Visit (HOSPITAL_COMMUNITY): Payer: Self-pay

## 2014-07-18 ENCOUNTER — Encounter (HOSPITAL_COMMUNITY): Payer: Medicare Other | Attending: Oncology

## 2014-07-18 ENCOUNTER — Other Ambulatory Visit (HOSPITAL_COMMUNITY): Payer: Self-pay | Admitting: Oncology

## 2014-07-18 ENCOUNTER — Encounter: Payer: Self-pay | Admitting: *Deleted

## 2014-07-18 VITALS — BP 110/64 | HR 67 | Temp 98.3°F | Resp 16 | Wt 124.0 lb

## 2014-07-18 DIAGNOSIS — C679 Malignant neoplasm of bladder, unspecified: Secondary | ICD-10-CM

## 2014-07-18 DIAGNOSIS — C672 Malignant neoplasm of lateral wall of bladder: Secondary | ICD-10-CM

## 2014-07-18 DIAGNOSIS — C3492 Malignant neoplasm of unspecified part of left bronchus or lung: Secondary | ICD-10-CM

## 2014-07-18 DIAGNOSIS — Z95828 Presence of other vascular implants and grafts: Secondary | ICD-10-CM

## 2014-07-18 DIAGNOSIS — R319 Hematuria, unspecified: Secondary | ICD-10-CM | POA: Insufficient documentation

## 2014-07-18 DIAGNOSIS — C3432 Malignant neoplasm of lower lobe, left bronchus or lung: Secondary | ICD-10-CM

## 2014-07-18 DIAGNOSIS — Z720 Tobacco use: Secondary | ICD-10-CM | POA: Insufficient documentation

## 2014-07-18 MED ORDER — HEPARIN SOD (PORK) LOCK FLUSH 100 UNIT/ML IV SOLN
500.0000 [IU] | Freq: Once | INTRAVENOUS | Status: AC
  Administered 2014-07-18: 500 [IU] via INTRAVENOUS

## 2014-07-18 MED ORDER — OXYCODONE-ACETAMINOPHEN 10-325 MG PO TABS: 1.0000 | ORAL_TABLET | ORAL | Status: DC | PRN

## 2014-07-18 MED ORDER — HEPARIN SOD (PORK) LOCK FLUSH 100 UNIT/ML IV SOLN
INTRAVENOUS | Status: AC
  Filled 2014-07-18: qty 5

## 2014-07-18 MED ORDER — SODIUM CHLORIDE 0.9 % IV SOLN
INTRAVENOUS | Status: DC
  Administered 2014-07-18: 11:00:00 via INTRAVENOUS

## 2014-07-18 NOTE — Progress Notes (Signed)
[place] Clinical Social Work  Clinical Social Work was referred by Pension scheme manager for assessment of psychosocial needs due to financial concerns.  Clinical Social Worker met with patient and his wife to review concerns related to cancer insurance policy. Pt and wife forgot to bring said policy, but brought several bills and insurance statements. Wife appears very overwhelmed and anxious about finances. CSW made several suggestions to wife and pt that may facilitate organizing these bills. They are aware to request an itemized bill through [employer], which is required to make a claim on their cancer policy. CSW recommended possibly requesting assistance from their son (or other family member) to assist them in organizing these bills and medical paperwork. Due to nature of illness this is a very difficult task for them to accomplish. Pt and wife plan to return with actual policy and itemized bill in the next few weeks and CSW will attempt to further assist them in getting the process started. They stated understanding and appreciation.  Clinical Social Work interventions: Problem solving Supportive listening Education  Loren Racer, [place] Tuesdays 8:30-1pm Wednesdays 8:30-12pm  [phone]

## 2014-07-18 NOTE — Progress Notes (Signed)
Tolerated IV fluids well.

## 2014-07-27 ENCOUNTER — Other Ambulatory Visit: Payer: Self-pay | Admitting: Radiation Oncology

## 2014-07-27 DIAGNOSIS — C679 Malignant neoplasm of bladder, unspecified: Secondary | ICD-10-CM

## 2014-07-27 DIAGNOSIS — C3490 Malignant neoplasm of unspecified part of unspecified bronchus or lung: Secondary | ICD-10-CM

## 2014-07-30 ENCOUNTER — Other Ambulatory Visit: Payer: Self-pay | Admitting: Radiation Oncology

## 2014-07-30 DIAGNOSIS — C679 Malignant neoplasm of bladder, unspecified: Secondary | ICD-10-CM

## 2014-07-31 ENCOUNTER — Ambulatory Visit (HOSPITAL_COMMUNITY): Admission: RE | Admit: 2014-07-31 | Payer: Medicare Other | Source: Ambulatory Visit

## 2014-08-03 ENCOUNTER — Encounter (HOSPITAL_COMMUNITY): Payer: Self-pay | Admitting: Oncology

## 2014-08-03 ENCOUNTER — Encounter (HOSPITAL_BASED_OUTPATIENT_CLINIC_OR_DEPARTMENT_OTHER): Payer: Medicare Other | Admitting: Oncology

## 2014-08-03 ENCOUNTER — Ambulatory Visit (HOSPITAL_COMMUNITY): Payer: Self-pay | Admitting: Hematology & Oncology

## 2014-08-03 VITALS — BP 109/27 | HR 96 | Temp 99.0°F | Resp 20 | Wt 122.7 lb

## 2014-08-03 DIAGNOSIS — C679 Malignant neoplasm of bladder, unspecified: Secondary | ICD-10-CM | POA: Diagnosis not present

## 2014-08-03 DIAGNOSIS — Z720 Tobacco use: Secondary | ICD-10-CM | POA: Diagnosis not present

## 2014-08-03 DIAGNOSIS — R319 Hematuria, unspecified: Secondary | ICD-10-CM | POA: Diagnosis not present

## 2014-08-03 DIAGNOSIS — G893 Neoplasm related pain (acute) (chronic): Secondary | ICD-10-CM | POA: Diagnosis not present

## 2014-08-03 DIAGNOSIS — F32A Depression, unspecified: Secondary | ICD-10-CM

## 2014-08-03 DIAGNOSIS — F329 Major depressive disorder, single episode, unspecified: Secondary | ICD-10-CM

## 2014-08-03 DIAGNOSIS — G4700 Insomnia, unspecified: Secondary | ICD-10-CM | POA: Diagnosis not present

## 2014-08-03 LAB — URINALYSIS, ROUTINE W REFLEX MICROSCOPIC
Glucose, UA: NEGATIVE mg/dL
HGB URINE DIPSTICK: NEGATIVE
KETONES UR: NEGATIVE mg/dL
Leukocytes, UA: NEGATIVE
Nitrite: NEGATIVE
PROTEIN: 30 mg/dL — AB
Specific Gravity, Urine: >1.03 — ABNORMAL HIGH (ref 1.005–1.030)
Urobilinogen, UA: 0.2 mg/dL (ref 0.0–1.0)
pH: 5 (ref 5.0–8.0)

## 2014-08-03 LAB — URINE MICROSCOPIC-ADD ON

## 2014-08-03 MED ORDER — PREGABALIN 75 MG PO CAPS: 75.0000 mg | ORAL_CAPSULE | Freq: Two times a day (BID) | ORAL | Status: DC

## 2014-08-03 MED ORDER — TRAZODONE HCL 50 MG PO TABS: 50.0000 mg | ORAL_TABLET | Freq: Every evening | ORAL | Status: DC | PRN

## 2014-08-03 NOTE — Assessment & Plan Note (Signed)
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  New agent recently approved for urothelial cancer, it is an immunotherapy (I believe Keytruda)  He is 6 weeks out from treatment.  He is scheduled for restaging tests later in July 2016.    I have recommended repeat cystoscopy by Dr. Irine Seal, but I agree with the patient that there may be too many things going on right now to pursue that (according to the patient).  He would like to wait until after restaging scans.  I think that is reasonable, because based on imaging, cystoscopy may not be needed.  Essentially, Brian Sims has declined a referral to urology for this test.  UA today.  Patient declined lab work today.  Rx for Trazodone 50 mg.  This is to be taken with 150 mg tablets for a dose of 200 mg at HS.  He may repeat 1 time (200 mg) if needed in early AM hours.  Rx for Lyrica 75 mg BID for "Nerve Pain."  I will look into adjunctive treatment for depression that can be used with Remeron.  We will reach out to him on Monday or Tuesday with this information.  Wellbutrin may be an option, but I would like to look into this option further first.  Return in 4 weeks following CT imaging to re-group.  I think Beryle Flock was recently approved for urothelial cancer and may be an option for Brian Sims in the future if needed.

## 2014-08-03 NOTE — Patient Instructions (Signed)
[place] at [facility] Discharge Instructions  RECOMMENDATIONS MADE BY THE CONSULTANT AND ANY TEST RESULTS WILL BE SENT TO YOUR REFERRING PHYSICIAN.  Start your new medications. Will send UA today. CT scans as scheduled. Return in 4 weeks for follow up.  Thank you for choosing [place] at [facility] to provide your oncology and hematology care.  To afford each patient quality time with our provider, please arrive at least 15 minutes before your scheduled appointment time.    You need to re-schedule your appointment should you arrive 10 or more minutes late.  We strive to give you quality time with our providers, and arriving late affects you and other patients whose appointments are after yours.  Also, if you no show three or more times for appointments you may be dismissed from the clinic at the providers discretion.     Again, thank you for choosing [facility].  Our hope is that these requests will decrease the amount of time that you wait before being seen by our physicians.       _____________________________________________________________  Should you have questions after your visit to [facility], please contact our office at [phone] between the hours of 8:30 a.m. and 4:30 p.m.  Voicemails left after 4:30 p.m. will not be returned until the following business day.  For prescription refill requests, have your pharmacy contact our office.

## 2014-08-03 NOTE — Progress Notes (Addendum)
Brian Bogus, MD [place] [address]  Malignant neoplasm of urinary bladder, unspecified site - Plan: Urinalysis, Routine w reflex microscopic, Urinalysis, Routine w reflex microscopic  Neoplasm related pain - Plan: pregabalin (LYRICA) 75 MG capsule  Insomnia - Plan: traZODone (DESYREL) 50 MG tablet  CURRENT THERAPY: Observation  INTERVAL HISTORY: Brian Sims 64 y.o. 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    Squamous cell carcinoma of left lung   10/25/2013 Initial Diagnosis T1N0 SCCA of the left lower lobe    Radiation Therapy    05/17/2014 Imaging CT chest- Persistent but improved appearance of left lower lobe lung mass. Small pulmonary nodules in the right lung are unchanged. No new or progressive disease identified.    Bladder cancer   01/18/2014 Initial Diagnosis 1. Bladder, biopsy, right lateral wall of bladder - UROTHELIAL CARCINOMA IN SITU, NO STROMAL INVASION IDENTIFIED.  3. Prostate, TUR, deep prostatic tissue - HIGH GRADE UROTHELIAL CARCINOMA WITH ASSOCIATED TUMOR NECROSIS, INVOLVING PROSTATIC DUCTS    02/14/2014 - 05/03/2014 Chemotherapy Carboplatin/Gemzar   05/09/2014 - 06/28/2014 Radiation Therapy Pelvis and bladder boost by Dr. [name].   05/11/2014 Treatment Plan Change Change chemotherapy to weekly Carboplatin (AUC 2)   05/11/2014 - 05/31/2014 Chemotherapy Weekly Carboplatin with XRT at  AUC 2.    Brian Sims has a number of complaints/issues: 1. Insomnia:  He is taking 150 mg of Trazodone at HS and then repeating in the very early morning hours.  He notes that when he takes his 2nd dose, he cannot fall back asleep.  In the past, he would.  "Can I get an increase in this medication?" 2. Nerve pain: He notes increase, "unbearable pain."  We will try Lyrica.  He notes a history of taking Lyrica in the distant past and it sounds like he tolerated it well. 3. "We want a urinalysis  done." 4. "Can we add something to his Remeron to help with his depression?"  I think this is a great idea as it may improve some of his aforementioned complaint.  However, Remeron has a number of interactions, so I will need to look into this before prescribing.  Wellbutrin may be a valid option.  We will let him know on Monday or Tuesday. 5. "Can we eat fresh fruits and vegetables?"- YES  We discussed the future plan.  Dr. Whitney Muse wishes for Brian Sims to have a repeat cystoscopy to evaluate response.  Brian Sims is hesitant at this time, because there is too much going on and he has significant pain.  He has repeat Ct imaging in late July.  Past Medical History  Diagnosis Date  . COPD (chronic obstructive pulmonary disease)   . Inflammatory bowel disease   . Constipation   . Lesion of bladder   . Prostate mass   . Dysuria-frequency syndrome     wears depends  . Arthritis   . Shortness of breath   . Depression   . Anxiety   . Kidney stones   . GERD (gastroesophageal reflux disease)   . Cancer 2015    LT lung, bladder and prostate    has IBS (irritable bowel syndrome); Back pain; Heme positive stool; Squamous cell carcinoma of left lung; Bladder cancer; Tobacco abuse; Hematuria; and Neoplasm related pain on his problem list.     is allergic to lyrica; gabapentin; and morphine and related.  Current Outpatient Prescriptions on File Prior to Visit  Medication Sig Dispense Refill  . ALPRAZolam (XANAX) 1 MG tablet Take 0.5 mg by mouth every 2 (two) hours.     . cephALEXin (KEFLEX) 500 MG capsule Take 1 capsule (500 mg total) by mouth 4 (four) times daily. 40 capsule 0  . Dextromethorphan Polistirex (DELSYM PO) Take 15 mLs by mouth as needed.    . docusate sodium (COLACE) 100 MG capsule Take 200 mg by mouth at bedtime.    . fluconazole (DIFLUCAN) 150 MG tablet Take one tablet on day one, then repeat on day 3 (Patient not taking: Reported on 06/15/2014) 2 tablet 0  . hydrocortisone  (PROCTOZONE-HC) 2.5 % rectal cream Place 1 application rectally 3 (three) times daily as needed for hemorrhoids (swelling).    Marland Kitchen lidocaine-prilocaine (EMLA) cream Apply 1 application topically as needed. 30 g 6  . magnesium hydroxide (MILK OF MAGNESIA) 400 MG/5ML suspension Take 30 mLs by mouth daily as needed for mild constipation.    . Meth-Hyo-M Bl-Na Phos-Ph Sal (URIBEL) 118 MG CAPS Take 118 mg by mouth 2 (two) times daily.     . mirtazapine (REMERON) 15 MG tablet Add to 45 mg dose for total of 60 mg daily 30 tablet 3  . mirtazapine (REMERON) 45 MG tablet Take 45 mg by mouth at bedtime.   11  . morphine (MS CONTIN) 15 MG 12 hr tablet Take 1 tablet (15 mg total) by mouth every 12 (twelve) hours. (Patient not taking: Reported on 05/25/2014) 60 tablet 0  . Multiple Vitamins-Minerals (MULTIVITAMIN WITH MINERALS) tablet Take 1 tablet by mouth every morning. [employer] Multi Vitamin    . NON FORMULARY Take 250 mg by mouth daily. Grape concentrate -used as antioxident    . omeprazole (PRILOSEC) 20 MG capsule Take 20 mg by mouth 2 (two) times daily before a meal.    . ondansetron (ZOFRAN) 8 MG tablet Take 1 tablet (8 mg total) by mouth every 8 (eight) hours as needed for nausea or vomiting. 30 tablet 3  . oxyCODONE-acetaminophen (PERCOCET) 10-325 MG per tablet Take 1-2 tablets by mouth every 4 (four) hours as needed for pain. 150 tablet 0  . potassium chloride SA (K-DUR,KLOR-CON) 20 MEQ tablet Take 1 tablet (20 mEq total) by mouth daily. 30 tablet 1  . PRESCRIPTION MEDICATION On Wednesdays for 21 days Carbo and Gemzar. Dr. Whitney Muse. [facility]    . prochlorperazine (COMPAZINE) 10 MG tablet Take 1 tablet (10 mg total) by mouth every 6 (six) hours as needed for nausea or vomiting. 30 tablet 1  . Propylene Glycol (SYSTANE BALANCE OP) Place 1 drop into both eyes daily.    Marland Kitchen senna (SENOKOT) 8.6 MG TABS tablet Take 1 tablet by mouth 3 (three) times daily as needed for mild constipation.    .  tamsulosin (FLOMAX) 0.4 MG CAPS capsule Take 1 capsule (0.4 mg total) by mouth daily. 30 capsule 3  . tiotropium (SPIRIVA) 18 MCG inhalation capsule Place 18 mcg into inhaler and inhale every evening.     . traZODone (DESYREL) 150 MG tablet Take 150 mg by mouth at bedtime.     . vitamin C (ASCORBIC ACID) 500 MG tablet Take 500 mg by mouth daily.     No current facility-administered medications on file prior to visit.    Past Surgical History  Procedure Laterality Date  . Back surgery    . Left shoulder      arthroscopy  . Rt knee arthroscopy    . Colonoscopy N/A 01/11/2013  Procedure: COLONOSCOPY;  Surgeon: [name], MD;  [facility];  Service: Endoscopy;  Laterality: N/A;  340  . Carpal tunnel release Right   . Transurethral resection of bladder tumor N/A 09/21/2013    Procedure: TRANSURETHRAL RESECTION  PROSTATE  AND BLADDER ;  Surgeon: [name], MD;  Location: [facility];  Service: Urology;  Laterality: N/A;  . Video bronchoscopy with endobronchial navigation Right 10/25/2013    Procedure: VIDEO BRONCHOSCOPY WITH ENDOBRONCHIAL NAVIGATION;  Surgeon: [name], MD;  Location: [facility];  Service: Thoracic;  Laterality: Right;  . Transurethral resection of bladder tumor with gyrus (turbt-gyrus) N/A 01/18/2014    Procedure: TRANSURETHRAL RESECTION OF BLADDER TUMOR WITH GYRUS (TURBT-GYRUS);  Surgeon: [name], MD;  Location: [facility];  Service: Urology;  Laterality: N/A;  . Transurethral resection of prostate N/A 01/18/2014    Procedure: TRANSURETHRAL RESECTION OF THE PROSTATE (TURP);  Surgeon: [name], MD;  Location: [facility];  Service: Urology;  Laterality: N/A;    Denies any headaches, dizziness, double vision, fevers, chills, night sweats, nausea, vomiting, diarrhea, constipation, chest pain, heart palpitations, shortness of breath, blood in stool, black tarry stool, urinary pain, urinary burning, urinary frequency, hematuria.   PHYSICAL  EXAMINATION  ECOG PERFORMANCE STATUS: 2 - Symptomatic, <50% confined to bed  Filed Vitals:   08/03/14 1401  BP: 109/27  Pulse: 96  Temp: 99 F (37.2 C)  Resp: 20    GENERAL:alert, no distress, cooperative, smiling and accompanied by wife and son, appearing chronically ill. SKIN: skin color, texture, turgor are normal, no rashes or significant lesions HEAD: Normocephalic, No masses, lesions, tenderness or abnormalities EYES: normal, PERRLA, EOMI, Conjunctiva are pink and non-injected EARS: External ears normal OROPHARYNX:lips, buccal mucosa, and tongue normal and mucous membranes are moist  NECK: supple, trachea midline LYMPH:  not examined BREAST:not examined LUNGS: clear to auscultation , decreased breath sounds bilaterally HEART: regular rate & rhythm ABDOMEN:normal bowel sounds BACK: Back symmetric, no curvature. EXTREMITIES:less then 2 second capillary refill, no joint deformities, effusion, or inflammation, no skin discoloration, no cyanosis  NEURO: alert & oriented x 3 with fluent speech, no focal motor/sensory deficits, gait normal   LABORATORY DATA: CBC    Component Value Date/Time   WBC 3.2* 06/15/2014 1257   RBC 3.45* 06/15/2014 1257   HGB 12.0* 06/15/2014 1257   HCT 36.1* 06/15/2014 1257   PLT 36* 06/15/2014 1257   MCV 104.6* 06/15/2014 1257   MCH 34.8* 06/15/2014 1257   MCHC 33.2 06/15/2014 1257   RDW 15.4 06/15/2014 1257   LYMPHSABS 0.9 06/15/2014 1257   MONOABS 0.5 06/15/2014 1257   EOSABS 0.0 06/15/2014 1257   BASOSABS 0.0 06/15/2014 1257      Chemistry      Component Value Date/Time   NA 141 06/15/2014 1257   K 3.8 06/15/2014 1257   CL 104 06/15/2014 1257   CO2 31 06/15/2014 1257   BUN 12 06/15/2014 1257   CREATININE 0.77 06/15/2014 1257      Component Value Date/Time   CALCIUM 9.3 06/15/2014 1257   ALKPHOS 50 06/15/2014 1257   AST 17 06/15/2014 1257   ALT 9* 06/15/2014 1257   BILITOT 0.4 06/15/2014 1257        ASSESSMENT AND PLAN:   Bladder cancer 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  New agent recently approved for urothelial cancer, it is an immunotherapy (I believe [place])  He is 6 weeks out from treatment.  He is scheduled for restaging tests later in July 2016.    I have recommended repeat cystoscopy by Dr. Irine Seal, but I agree with the patient that there may be too many things going on right now to pursue that (according to the patient).  He would like to wait until after restaging scans.  I think that is reasonable, because based on imaging, cystoscopy may not be needed.  Essentially, Brian Sims has declined a referral to urology for this test.  UA today.  Patient declined lab work today.  Rx for Trazodone 50 mg.  This is to be taken with 150 mg tablets for a dose of 200 mg at HS.  He may repeat 1 time (200 mg) if needed in early AM hours.  Rx for Lyrica 75 mg BID for "Nerve Pain."  I will look into adjunctive treatment for depression that can be used with Remeron.  We will reach out to him on Monday or Tuesday with this information.  Wellbutrin may be an option, but I would like to look into this option further first.  Return in 4 weeks following CT imaging to re-group.  I think Beryle Flock was recently approved for urothelial cancer and may be an option for Brian Sims in the future if needed.    THERAPY PLAN:  Restaging scans as scheduled in 4 weeks.  Return following.  All questions were answered. The patient knows to call the clinic with any problems, questions or concerns. We can certainly see the patient much sooner if necessary.  Patient and plan discussed with Dr. Ancil Linsey and she is in agreement with the aforementioned.   This note is electronically signed by: Brian Sims 08/03/2014 4:48 PM   ADDENDUM: I will add Wellbutrin XL to his antidepressant regimen.  I will start at  150 mg in AM daily and increase after 4 days to 300 mg in AM daily.  Brian Sims 08/06/2014 11:20 AM

## 2014-08-06 MED ORDER — BUPROPION HCL ER (XL) 150 MG PO TB24: ORAL_TABLET | ORAL | Status: DC

## 2014-08-06 NOTE — Addendum Note (Signed)
Addended by: Baird Cancer on: 08/06/2014 11:21 AM   Modules accepted: Orders

## 2014-08-09 DIAGNOSIS — Z0279 Encounter for issue of other medical certificate: Secondary | ICD-10-CM

## 2014-08-24 ENCOUNTER — Other Ambulatory Visit (HOSPITAL_COMMUNITY): Payer: Self-pay | Admitting: Hematology & Oncology

## 2014-08-30 ENCOUNTER — Ambulatory Visit (HOSPITAL_COMMUNITY)
Admission: RE | Admit: 2014-08-30 | Discharge: 2014-08-30 | Disposition: A | Discharge status: Home / Self Care | Payer: Medicare Other | Source: Ambulatory Visit | Attending: Radiation Oncology | Admitting: Radiation Oncology

## 2014-08-30 DIAGNOSIS — Z923 Personal history of irradiation: Secondary | ICD-10-CM | POA: Insufficient documentation

## 2014-08-30 DIAGNOSIS — C679 Malignant neoplasm of bladder, unspecified: Secondary | ICD-10-CM | POA: Diagnosis present

## 2014-08-30 DIAGNOSIS — C3490 Malignant neoplasm of unspecified part of unspecified bronchus or lung: Secondary | ICD-10-CM

## 2014-08-30 DIAGNOSIS — Z9221 Personal history of antineoplastic chemotherapy: Secondary | ICD-10-CM | POA: Diagnosis not present

## 2014-08-30 LAB — POCT I-STAT CREATININE: Creatinine, Ser: 0.9 mg/dL (ref 0.61–1.24)

## 2014-08-30 MED ORDER — IOHEXOL 300 MG/ML  SOLN
100.0000 mL | Freq: Once | INTRAMUSCULAR | Status: AC | PRN
  Administered 2014-08-30: 100 mL via INTRAVENOUS

## 2014-09-03 ENCOUNTER — Encounter (HOSPITAL_COMMUNITY): Payer: Medicare Other | Attending: Oncology | Admitting: Hematology & Oncology

## 2014-09-03 ENCOUNTER — Encounter (HOSPITAL_COMMUNITY): Payer: Self-pay | Admitting: Hematology & Oncology

## 2014-09-03 ENCOUNTER — Other Ambulatory Visit (HOSPITAL_COMMUNITY): Payer: Self-pay | Admitting: Hematology & Oncology

## 2014-09-03 VITALS — BP 110/72 | HR 72 | Temp 98.6°F | Resp 20 | Wt 120.9 lb

## 2014-09-03 DIAGNOSIS — R319 Hematuria, unspecified: Secondary | ICD-10-CM | POA: Insufficient documentation

## 2014-09-03 DIAGNOSIS — C3492 Malignant neoplasm of unspecified part of left bronchus or lung: Secondary | ICD-10-CM

## 2014-09-03 DIAGNOSIS — Z720 Tobacco use: Secondary | ICD-10-CM | POA: Insufficient documentation

## 2014-09-03 DIAGNOSIS — C679 Malignant neoplasm of bladder, unspecified: Secondary | ICD-10-CM | POA: Insufficient documentation

## 2014-09-03 MED ORDER — TRAZODONE HCL 150 MG PO TABS: ORAL_TABLET | ORAL | Status: DC

## 2014-09-03 MED ORDER — MIRTAZAPINE 15 MG PO TABS: ORAL_TABLET | ORAL | Status: DC

## 2014-09-03 MED ORDER — OXYCODONE-ACETAMINOPHEN 10-325 MG PO TABS: 1.0000 | ORAL_TABLET | ORAL | Status: DC | PRN

## 2014-09-03 NOTE — Progress Notes (Signed)
Brian Bogus, MD [address] Brian Sims [id number]  HIGH GRADE UROTHELIAL CARCINOMA OF THE R BLADDER WITH PROSTATIC DUCT INVASION  STAGE I NSCLC, Squamous Cell Histology S/P XRT with treatment dates 11/22/2013 to 12/27/2013  TOBACCO ABUSE  DEPRESSION  CURRENT THERAPY: Carboplatin/Gemzar C1D1 started on 02/14/2014 thru 05/03/2014 XRT given concurrently with weekly carboplatin  INTERVAL HISTORY: Brian Sims 64 y.o. 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 He also was treated for a Stage I NSCLC, squamous cell histology by Dr. Pablo Sims. He has just had repeat CT imaging.  The patient is present with family today and says that he has pain from the left side of his bladder to his right.  He says that before, his medication would ease the pain some but it no longer does that.  He also doesn't enjoy food any longer and says that he is afraid to eat due to ongoing "bowel issues."  He says that he cannot get "his bowel regimen together no matter how hard he tries.".  The patient denies blood in his urine. He does not like taking Lyrica.  It does not make him feel well and it only helps with some of his nerve pain.   He and his family are aware that his CT scans show probable progression in the lung. They are inquiring what next options may be. They see Dr. Pablo Sims next week.  He continues to have many of the same problems. He has trouble sleeping. He has chronic pain and cannot take pain medications later in the day because they interfere with sleep. He has issues with depression. He has difficulty with his bowels, he has a poor appetite. His weight is down from April approximately 8 pounds.    Squamous cell carcinoma of left lung   10/25/2013 Initial Diagnosis T1N0 SCCA of the left lower lobe    Radiation Therapy    05/17/2014 Imaging CT chest- Persistent but improved appearance of left lower lobe lung mass. Small  pulmonary nodules in the right lung are unchanged. No new or progressive disease identified.    Bladder cancer   01/18/2014 Initial Diagnosis 1. Bladder, biopsy, right lateral wall of bladder - UROTHELIAL CARCINOMA IN SITU, NO STROMAL INVASION IDENTIFIED.  3. Prostate, TUR, deep prostatic tissue - HIGH GRADE UROTHELIAL CARCINOMA WITH ASSOCIATED TUMOR NECROSIS, INVOLVING PROSTATIC DUCTS    02/14/2014 - 05/03/2014 Chemotherapy Carboplatin/Gemzar   05/09/2014 - 06/28/2014 Radiation Therapy Pelvis and bladder boost by Dr. Pablo Sims.   05/11/2014 Treatment Plan Change Change chemotherapy to weekly Carboplatin (AUC 2)   05/11/2014 - 05/31/2014 Chemotherapy Weekly Carboplatin with XRT at  AUC 2.     Past Medical History  Diagnosis Date  . COPD (chronic obstructive pulmonary disease)   . Inflammatory bowel disease   . Constipation   . Lesion of bladder   . Prostate mass   . Dysuria-frequency syndrome     wears depends  . Arthritis   . Shortness of breath   . Depression   . Anxiety   . Kidney stones   . GERD (gastroesophageal reflux disease)   . Cancer 2015    LT lung, bladder and prostate    has IBS (irritable bowel syndrome); Back pain; Heme positive stool; Squamous cell carcinoma of left lung; Bladder cancer; Tobacco abuse; Hematuria; and Neoplasm related pain on his problem list.     is allergic to lyrica; gabapentin; and morphine and related.  Mr. Brian Sims had no medications administered during this visit.  Past Surgical History  Procedure Laterality Date  . Back surgery    . Left shoulder      arthroscopy  . Rt knee arthroscopy    . Colonoscopy N/A 01/11/2013    Procedure: COLONOSCOPY;  Surgeon: Brian Houston, MD;  [facility];  Service: Endoscopy;  Laterality: N/A;  340  . Carpal tunnel release Right   . Transurethral resection of bladder tumor N/A 09/21/2013    Procedure: TRANSURETHRAL RESECTION  PROSTATE  AND BLADDER ;  Surgeon: Brian So, MD;  Location: [facility];  Service: Urology;  Laterality: N/A;  . Video bronchoscopy with endobronchial navigation Right 10/25/2013    Procedure: VIDEO BRONCHOSCOPY WITH ENDOBRONCHIAL NAVIGATION;  Surgeon: Brian Isaac, MD;  Location: [facility];  Service: Thoracic;  Laterality: Right;  . Transurethral resection of bladder tumor with gyrus (turbt-gyrus) N/A 01/18/2014    Procedure: TRANSURETHRAL RESECTION OF BLADDER TUMOR WITH GYRUS (TURBT-GYRUS);  Surgeon: Brian So, MD;  Location: [facility];  Service: Urology;  Laterality: N/A;  . Transurethral resection of prostate N/A 01/18/2014    Procedure: TRANSURETHRAL RESECTION OF THE PROSTATE (TURP);  Surgeon: Brian So, MD;  [facility];  Service: Urology;  Laterality: N/A;    Denies any headaches, dizziness, double vision, fevers, chills, night sweats, nausea, vomiting, diarrhea, constipation, chest pain, heart palpitations, shortness of breath, blood in stool, black tarry stool, urinary pain, urinary burning, urinary frequency, hematuria. 14 point review of systems was performed and is negative except as detailed under history of present illness and above    PHYSICAL EXAMINATION  ECOG PERFORMANCE STATUS: 1 - Symptomatic but completely ambulatory  Filed Vitals:   09/03/14 1300  BP: 110/72  Pulse: 72  Temp: 98.6 F (37 C)  Resp: 20    GENERAL:alert, no distress, comfortable, cooperative and chronically ill appearing, accompanied by wife and son. SKIN: skin color, texture, turgor are normal, no rashes or significant lesions HEAD: Normocephalic, No masses, lesions, tenderness or abnormalities EYES: normal, PERRLA EARS: External ears normal OROPHARYNX:lips, buccal mucosa, and tongue normal and mucous membranes are moist  NECK: supple, trachea midline LYMPH: no palpable adenopathy in the neck or supraclavicular regions BREAST:not examined LUNGS: decreased breath sounds HEART: regular rate & rhythm, no murmurs and no gallops ABDOMEN:abdomen  soft and normal bowel sounds, wearing adult depends BACK: Back symmetric, no curvature. EXTREMITIES:less then 2 second capillary refill, no joint deformities, effusion, or inflammation, no skin discoloration, no cyanosis  NEURO: alert & oriented x 3 with fluent speech, no focal motor/sensory deficits   RADIOLOGY: I have reviewed the radiology images listed below and agree with the results CLINICAL DATA: Subsequent evaluation of a 64 year old male previously diagnosed with lung cancer and high-grade urothelial cancer with prostatic invasion in 2014 status post radiation therapy and chemotherapy.  EXAM: CT CHEST, ABDOMEN, AND PELVIS WITH CONTRAST  TECHNIQUE: Multidetector CT imaging of the chest, abdomen and pelvis was performed following the standard protocol during bolus administration of intravenous contrast.  CONTRAST: 189m OMNIPAQUE IOHEXOL 300 MG/ML SOLN  COMPARISON: Chest CT 06/06/2014. CT of the abdomen and pelvis 01/29/2014.  FINDINGS: CT CHEST FINDINGS IMPRESSION: 1. Significant enlargement of left lower lobe mass, with worsening postobstructive changes in the left lower lobe, and new (likely malignant) small left pleural effusion. 2. Diffuse thickening of the urinary bladder, without a clearly identifiable bladder wall mass. 3. No new signs of metastatic disease noted elsewhere  in the chest, abdomen or pelvis. 4. Atherosclerosis, including left main and 3 vessel coronary artery disease. Assessment for potential risk factor modification, dietary therapy or pharmacologic therapy may be warranted, if clinically indicated. 5. Additional incidental findings, similar to prior examinations, as above.   Electronically Signed  By: Vinnie Langton M.D.  On: 08/30/2014 15:52   LABORATORY DATA: I have reviewed the laboratory studies listed below  Results for Brian Sims, Brian Sims (MRN [id number]) as of 09/03/2014 16:30  Ref. Range 06/15/2014 12:57  Sodium  Latest Ref Range: 135-145 mmol/L 141  Potassium Latest Ref Range: 3.5-5.1 mmol/L 3.8  Chloride Latest Ref Range: 101-111 mmol/L 104  CO2 Latest Ref Range: 22-32 mmol/L 31  BUN Latest Ref Range: 6-20 mg/dL 12  Creatinine Latest Ref Range: 0.61-1.24 mg/dL 0.77  Calcium Latest Ref Range: 8.9-10.3 mg/dL 9.3  EGFR (Non-African Amer.) Latest Ref Range: >60 mL/min >60  EGFR (African American) Latest Ref Range: >60 mL/min >60  Glucose Latest Ref Range: 70-99 mg/dL 99  Anion gap Latest Ref Range: 5-15  6  Alkaline Phosphatase Latest Ref Range: 38-126 U/L 50  Albumin Latest Ref Range: 3.5-5.0 g/dL 3.8  AST Latest Ref Range: 15-41 U/L 17  ALT Latest Ref Range: 17-63 U/L 9 (L)  Total Protein Latest Ref Range: 6.5-8.1 g/dL 6.6  Total Bilirubin Latest Ref Range: 0.3-1.2 mg/dL 0.4  WBC Latest Ref Range: 4.0-10.5 K/uL 3.2 (L)  RBC Latest Ref Range: 4.22-5.81 MIL/uL 3.45 (L)  Hemoglobin Latest Ref Range: 13.0-17.0 g/dL 12.0 (L)  HCT Latest Ref Range: 39.0-52.0 % 36.1 (L)  MCV Latest Ref Range: 78.0-100.0 fL 104.6 (H)  MCH Latest Ref Range: 26.0-34.0 pg 34.8 (H)  MCHC Latest Ref Range: 30.0-36.0 g/dL 33.2  RDW Latest Ref Range: 11.5-15.5 % 15.4  Platelets Latest Ref Range: 150-400 K/uL 36 (L)  Neutrophils Latest Ref Range: 43-77 % 57  Lymphocytes Latest Ref Range: 12-46 % 27  Monocytes Relative Latest Ref Range: 3-12 % 15 (H)  Eosinophil Latest Ref Range: 0-5 % 1  Basophil Latest Ref Range: 0-1 % 0  NEUT# Latest Ref Range: 1.7-7.7 K/uL 1.9  Lymphocyte # Latest Ref Range: 0.7-4.0 K/uL 0.9  Monocyte # Latest Ref Range: 0.1-1.0 K/uL 0.5  Eosinophils Absolute Latest Ref Range: 0.0-0.7 K/uL 0.0  Basophils Absolute Latest Ref Range: 0.0-0.1 K/uL 0.0      ASSESSMENT AND PLAN:  Depression I believe this is a significant component of many of his problems. He is finally taking his trazodone as prescribed and is sleeping better. He is taking Remeron regularly.He had an appointment with behavioral health  but did not go.  He continues on trazodone and remeron. His Trazodone was refilled.  They inquired about increasing the dose. I reviewed that higher doses greater than 300 mg have been used but are not routinely used. He has been taking more than 300 mg routinely. I advised them I would call him in a prescription for 375 mg to take at night but caution them against taking more than prescribed.  Pelvic pain Stable with no new compalints.  Stage I non-small cell lung cancer  CT imaging shows progression of disease in the left chest. I discussed additional options with the patient and his family, specifically immunotherapy. We discussed doing no treatment, they were not interested in pursuing that. I would like for them to meet with Dr. Pablo Sims next week, I will also send her noted in [place]. I advised the patient and his wife and son that we will regroup after  they meet with her and talk about other potential options for therapy.  Bladder cancer  He has not had follow-up with [facility]. At this point I am not sure of the benefit. His bladder cancer was not seen on imaging studies, clinically he does not appear to have progressive disease.  This document serves as a record of services personally performed by Ancil Linsey, MD. It was created on her behalf by Janace Hoard, a trained medical scribe. The creation of this record is based on the scribe's personal observations and the provider's statements to them. This document has been checked and approved by the attending provider.  I have reviewed the above documentation for accuracy and completeness, and I agree with the above.  This note was electronically signed.  Kelby Fam. Whitney Muse, MD

## 2014-09-03 NOTE — Patient Instructions (Signed)
[place] at [facility] Discharge Instructions  RECOMMENDATIONS MADE BY THE CONSULTANT AND ANY TEST RESULTS WILL BE SENT TO YOUR REFERRING PHYSICIAN.  Please return in 2 weeks for a follow up visit.Call for concerns or questions.  Thank you for choosing [place] at [facility] to provide your oncology and hematology care.  To afford each patient quality time with our provider, please arrive at least 15 minutes before your scheduled appointment time.    You need to re-schedule your appointment should you arrive 10 or more minutes late.  We strive to give you quality time with our providers, and arriving late affects you and other patients whose appointments are after yours.  Also, if you no show three or more times for appointments you may be dismissed from the clinic at the providers discretion.     Again, thank you for choosing [facility].  Our hope is that these requests will decrease the amount of time that you wait before being seen by our physicians.       _____________________________________________________________  Should you have questions after your visit to [facility], please contact our office at [phone] between the hours of 8:30 a.m. and 4:30 p.m.  Voicemails left after 4:30 p.m. will not be returned until the following business day.  For prescription refill requests, have your pharmacy contact our office.

## 2014-09-07 ENCOUNTER — Encounter: Payer: Self-pay | Admitting: Dietician

## 2014-09-07 NOTE — Progress Notes (Signed)
Patient identified to be at risk for malnutrition on the MST secondary to Poor PO intake and weight loss  Contacted Pt by Phone  Wt Readings from Last 10 Encounters:  09/03/14 120 lb 14.4 oz (54.84 kg)  08/03/14 122 lb 11.2 oz (55.656 kg)  07/18/14 124 lb (56.246 kg)  06/15/14 127 lb 14.4 oz (58.015 kg)  06/08/14 126 lb 12.8 oz (57.516 kg)  06/06/14 128 lb (58.06 kg)  05/31/14 127 lb 12.8 oz (57.97 kg)  05/25/14 128 lb 9.6 oz (58.333 kg)  05/18/14 129 lb 9.6 oz (58.786 kg)  05/11/14 128 lb 12.8 oz (58.423 kg)   Patient weight has Decreased by 8 lbs in 3 months  Spoke with pt's wife, who reports pt's appetite as very poor and states he is suffering from a complete lack of appetite.    Wife states pt will each only 1 meal a day with a couple snacks (cookies). She mentioned how he is "so sick". He has no desire to eat, though does eat a little anyway. He has been on remeron to stimulate appetite, but wife reports that this has not helped him at all. I urged her to discuss other appetite stimulants (megace or  marinol) with MD at pt's next appointment. She states she would.   They have tried many different protein/calorie supplements: both Boost and Ensure as well as clear and regular. She keeps them at the house, but patient wont drink them. The regular Ensure/Boost was too thick and milky and the clears were too sweet.  She mentioned how she has a couple appointments with pt's oncologists coming up where they will discuss the best options to proceed.  She stated that the patient and her both "have tried everything they could" Pt's wife was very overwhelmed and sounded very defeated when I spoke with her  After speaking with her, it sounds like pt is not getting enough protein, vitamins, minerals and I am concerned about his nutritional status. I did not mention it to wife, but if patient continues to pursue therapy, a TF may be his best option for adequate nutrition  Will continue to  monitor pt closely.   Burtis Junes RD, LDN Nutrition Pager: [phone] 09/07/2014 4:15 PM

## 2014-09-17 ENCOUNTER — Encounter (HOSPITAL_COMMUNITY): Payer: Self-pay | Admitting: Hematology & Oncology

## 2014-09-17 ENCOUNTER — Encounter (HOSPITAL_COMMUNITY): Payer: Medicare Other | Attending: Oncology | Admitting: Hematology & Oncology

## 2014-09-17 VITALS — BP 109/75 | HR 64 | Temp 98.3°F | Resp 20 | Wt 121.7 lb

## 2014-09-17 DIAGNOSIS — C679 Malignant neoplasm of bladder, unspecified: Secondary | ICD-10-CM

## 2014-09-17 DIAGNOSIS — R197 Diarrhea, unspecified: Secondary | ICD-10-CM

## 2014-09-17 DIAGNOSIS — C3492 Malignant neoplasm of unspecified part of left bronchus or lung: Secondary | ICD-10-CM

## 2014-09-17 DIAGNOSIS — R102 Pelvic and perineal pain: Secondary | ICD-10-CM

## 2014-09-17 DIAGNOSIS — C3432 Malignant neoplasm of lower lobe, left bronchus or lung: Secondary | ICD-10-CM

## 2014-09-17 DIAGNOSIS — Z720 Tobacco use: Secondary | ICD-10-CM | POA: Insufficient documentation

## 2014-09-17 DIAGNOSIS — R195 Other fecal abnormalities: Secondary | ICD-10-CM

## 2014-09-17 DIAGNOSIS — F329 Major depressive disorder, single episode, unspecified: Secondary | ICD-10-CM

## 2014-09-17 DIAGNOSIS — R319 Hematuria, unspecified: Secondary | ICD-10-CM | POA: Insufficient documentation

## 2014-09-17 MED ORDER — OMEPRAZOLE 20 MG PO CPDR: 20.0000 mg | DELAYED_RELEASE_CAPSULE | Freq: Two times a day (BID) | ORAL | Status: AC

## 2014-09-17 MED ORDER — TAMSULOSIN HCL 0.4 MG PO CAPS: 0.4000 mg | ORAL_CAPSULE | Freq: Every day | ORAL | Status: DC

## 2014-09-17 NOTE — Progress Notes (Signed)
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 He also was treated for a Stage I NSCLC, squamous cell histology by Dr. Pablo Sims. He has just had repeat CT imaging.  The patient is present with family today (wife and son) they have met with Dr. Pablo Sims. We discussed doing additional radiation to the lung lesion and decided that was not the best first option. He is here today to discuss immunotherapy.  He asked if this new medication would "cause him to go to the bathroom". He has had diarrhea for the last two weeks. Before that he had issues with constipation. He tries to eat what he can. He says he has been drinking Boost. He has tried Imodium but to no affect. His wife notes that sometimes his stool looks strange, reporting that it is green sometimes. He is agreeable to bringing in a stool sample later this week. He has been keeping to his strict diet. However, for his birthday, they went to [facility] where he had flounder and popcorn shrimp. He experienced some chest discomfort after this. He took an antacid which alleviated the discomfort.  He does note that he has not felt any pain since. He would like to try to eat more foods that he enjoys. He has restricted his diet for some time because of ongoing GI problems that it been present for several years.  He currently denies cough. His pain is baseline and unchanged. Dr. Pablo Sims has encouraged them to be more active and I continue to emphasize this as  well.    Squamous cell carcinoma of left lung   10/25/2013 Initial Diagnosis T1N0 SCCA of the left lower lobe    Radiation Therapy    05/17/2014 Imaging CT chest- Persistent but improved appearance of left lower lobe lung mass. Small pulmonary nodules in the right lung are unchanged. No new or progressive disease identified.    Bladder cancer   01/18/2014 Initial Diagnosis 1. Bladder, biopsy, right lateral wall of bladder - UROTHELIAL CARCINOMA IN SITU, NO STROMAL INVASION IDENTIFIED.  3. Prostate, TUR, deep prostatic tissue - HIGH GRADE UROTHELIAL CARCINOMA WITH ASSOCIATED TUMOR NECROSIS, INVOLVING PROSTATIC DUCTS    02/14/2014 - 05/03/2014 Chemotherapy Carboplatin/Gemzar   05/09/2014 - 06/28/2014 Radiation Therapy Pelvis and bladder boost by Dr. Pablo Sims.   05/11/2014 Treatment Plan Change Change chemotherapy to weekly Carboplatin (AUC 2)   05/11/2014 - 05/31/2014 Chemotherapy Weekly Carboplatin with XRT at  AUC 2.     Past Medical History  Diagnosis Date  . COPD (chronic obstructive pulmonary disease)   . Inflammatory bowel disease   . Constipation   . Lesion of bladder   . Prostate mass   . Dysuria-frequency syndrome     wears depends  . Arthritis   . Shortness of breath   . Depression   . Anxiety   . Kidney stones   . GERD (gastroesophageal reflux disease)   . Cancer 2015    LT lung, bladder and prostate    has IBS (irritable bowel syndrome); Back pain; Heme positive stool;  Squamous cell carcinoma of left lung; Bladder cancer; Tobacco abuse; Hematuria; and Neoplasm related pain on his problem list.     is allergic to lyrica; gabapentin; and morphine and related.  Mr. Brian Sims had no medications administered during this visit.  Past Surgical History  Procedure Laterality Date  . Back surgery    . Left shoulder      arthroscopy  . Rt knee arthroscopy    . Colonoscopy N/A 01/11/2013    Procedure: COLONOSCOPY;  Surgeon: Brian Houston, MD;  Location: [facility];  Service:  Endoscopy;  Laterality: N/A;  340  . Carpal tunnel release Right   . Transurethral resection of bladder tumor N/A 09/21/2013    Procedure: TRANSURETHRAL RESECTION  PROSTATE  AND BLADDER ;  Surgeon: Brian So, MD;  Location: [facility];  Service: Urology;  Laterality: N/A;  . Video bronchoscopy with endobronchial navigation Right 10/25/2013    Procedure: VIDEO BRONCHOSCOPY WITH ENDOBRONCHIAL NAVIGATION;  Surgeon: Brian Isaac, MD;  Location: [facility];  Service: Thoracic;  Laterality: Right;  . Transurethral resection of bladder tumor with gyrus (turbt-gyrus) N/A 01/18/2014    Procedure: TRANSURETHRAL RESECTION OF BLADDER TUMOR WITH GYRUS (TURBT-GYRUS);  Surgeon: Brian So, MD;  Location: [facility];  Service: Urology;  Laterality: N/A;  . Transurethral resection of prostate N/A 01/18/2014    Procedure: TRANSURETHRAL RESECTION OF THE PROSTATE (TURP);  Surgeon: Brian So, MD;  Location: [facility];  Service: Urology;  Laterality: N/A;    Denies any headaches, dizziness, double vision, fevers, chills, night sweats, nausea, vomiting, constipation, chest pain, heart palpitations, shortness of breath, blood in stool, black tarry stool, urinary pain, urinary burning, urinary frequency, hematuria. Positive for diarrhea.  14 point review of systems was performed and is negative except as detailed under history of present illness and above    PHYSICAL EXAMINATION  ECOG PERFORMANCE STATUS: 1 - Symptomatic but completely ambulatory  Filed Vitals:   09/17/14 1301  BP: 109/75  Pulse: 64  Temp: 98.3 F (36.8 C)  Resp: 20    GENERAL:alert, no distress, comfortable, cooperative and chronically ill appearing, accompanied by wife and son. SKIN: skin color, texture, turgor are normal, no rashes or significant lesions HEAD: Normocephalic, No masses, lesions, tenderness or abnormalities EYES: normal, PERRLA EARS: External ears normal OROPHARYNX:lips, buccal mucosa, and tongue normal and  mucous membranes are moist  NECK: supple, trachea midline LYMPH: no palpable adenopathy in the neck or supraclavicular regions BREAST:not examined LUNGS: decreased breath sounds HEART: regular rate & rhythm, no murmurs and no gallops ABDOMEN:abdomen soft and normal bowel sounds, wearing adult depends BACK: Back symmetric, no curvature. EXTREMITIES:less then 2 second capillary refill, no joint deformities, effusion, or inflammation, no skin discoloration, no cyanosis  NEURO: alert & oriented x 3 with fluent speech, no focal motor/sensory deficits   RADIOLOGY: I have reviewed the radiology images listed below and agree with the results CLINICAL DATA: Subsequent evaluation of a 64 year old male previously diagnosed with lung cancer and high-grade urothelial cancer with prostatic invasion in 2014 status post radiation therapy and chemotherapy.  EXAM: CT CHEST, ABDOMEN, AND PELVIS WITH CONTRAST  TECHNIQUE: Multidetector CT imaging of the chest, abdomen and pelvis was performed following the standard protocol during bolus administration of intravenous contrast.  CONTRAST: 132m OMNIPAQUE IOHEXOL 300 MG/ML SOLN  COMPARISON: Chest CT 06/06/2014. CT of the abdomen and pelvis 01/29/2014.  FINDINGS: CT CHEST FINDINGS IMPRESSION: 1. Significant enlargement of left lower lobe mass, with worsening postobstructive changes  in the left lower lobe, and new (likely malignant) small left pleural effusion. 2. Diffuse thickening of the urinary bladder, without a clearly identifiable bladder wall mass. 3. No new signs of metastatic disease noted elsewhere in the chest, abdomen or pelvis. 4. Atherosclerosis, including left main and 3 vessel coronary artery disease. Assessment for potential risk factor modification, dietary therapy or pharmacologic therapy may be warranted, if clinically indicated. 5. Additional incidental findings, similar to prior examinations,  as above.   Electronically Signed  By: Vinnie Langton M.D.  On: 08/30/2014 15:52   LABORATORY DATA: I have reviewed the laboratory studies listed below  Results for BRYLAN, DEC (MRN [id number])   Ref. Range 06/15/2014 12:57  Sodium Latest Ref Range: 135-145 mmol/L 141  Potassium Latest Ref Range: 3.5-5.1 mmol/L 3.8  Chloride Latest Ref Range: 101-111 mmol/L 104  CO2 Latest Ref Range: 22-32 mmol/L 31  BUN Latest Ref Range: 6-20 mg/dL 12  Creatinine Latest Ref Range: 0.61-1.24 mg/dL 0.77  Calcium Latest Ref Range: 8.9-10.3 mg/dL 9.3  EGFR (Non-African Amer.) Latest Ref Range: >60 mL/min >60  EGFR (African American) Latest Ref Range: >60 mL/min >60  Glucose Latest Ref Range: 70-99 mg/dL 99  Anion gap Latest Ref Range: 5-15  6  Alkaline Phosphatase Latest Ref Range: 38-126 U/L 50  Albumin Latest Ref Range: 3.5-5.0 g/dL 3.8  AST Latest Ref Range: 15-41 U/L 17  ALT Latest Ref Range: 17-63 U/L 9 (L)  Total Protein Latest Ref Range: 6.5-8.1 g/dL 6.6  Total Bilirubin Latest Ref Range: 0.3-1.2 mg/dL 0.4  WBC Latest Ref Range: 4.0-10.5 K/uL 3.2 (L)  RBC Latest Ref Range: 4.22-5.81 MIL/uL 3.45 (L)  Hemoglobin Latest Ref Range: 13.0-17.0 g/dL 12.0 (L)  HCT Latest Ref Range: 39.0-52.0 % 36.1 (L)  MCV Latest Ref Range: 78.0-100.0 fL 104.6 (H)  MCH Latest Ref Range: 26.0-34.0 pg 34.8 (H)  MCHC Latest Ref Range: 30.0-36.0 g/dL 33.2  RDW Latest Ref Range: 11.5-15.5 % 15.4  Platelets Latest Ref Range: 150-400 K/uL 36 (L)  Neutrophils Latest Ref Range: 43-77 % 57  Lymphocytes Latest Ref Range: 12-46 % 27  Monocytes Relative Latest Ref Range: 3-12 % 15 (H)  Eosinophil Latest Ref Range: 0-5 % 1  Basophil Latest Ref Range: 0-1 % 0  NEUT# Latest Ref Range: 1.7-7.7 K/uL 1.9  Lymphocyte # Latest Ref Range: 0.7-4.0 K/uL 0.9  Monocyte # Latest Ref Range: 0.1-1.0 K/uL 0.5  Eosinophils Absolute Latest Ref Range: 0.0-0.7 K/uL 0.0  Basophils Absolute Latest Ref Range: 0.0-0.1 K/uL 0.0       ASSESSMENT AND PLAN:  Stage I non-small cell lung cancer  CT imaging shows progression of disease in the left chest. He has met with Dr. Pablo Sims. Also concurs that the patient should try immunotherapy. I do think he would tolerate this well. The family has already bed on several options and are aware of some of the side effects including pneumonitis and colitis. We will start with Nivolumab. We will arrange for chemotherapy teaching and start the patient within the next week.  Depression I believe this is a significant component of many of his problems. He is finally taking his trazodone as prescribed and is sleeping better. He is taking Remeron regularly.He had an appointment with behavioral health but did not go.  He continues on trazodone and remeron. His Trazodone was refilled at his last visit.   Pelvic pain Stable with no new compalints.   Bladder cancer  He has not had follow-up with GU. At this point I  am not sure of the benefit. His bladder cancer was not seen on imaging studies, clinically he does not appear to have progressive disease.   I have refilled his Flomax and Omeprazole today.  Diarrhea  He has had chronic GI issues ranging from constipation, to pain and now diarrhea of several weeks duration. We will test for C. difficile and I have also given him a Hemoccult card.   We will see Mr. Smoak again one week after he begins treatment.  This document serves as a record of services personally performed by Ancil Linsey, MD. It was created on her behalf by Arlyce Harman, a trained medical scribe. The creation of this record is based on the scribe's personal observations and the provider's statements to them. This document has been checked and approved by the attending provider.  I have reviewed the above documentation for accuracy and completeness, and I agree with the above.  This note was electronically signed.  Kelby Fam. Whitney Muse, MD

## 2014-09-17 NOTE — Patient Instructions (Addendum)
[place] at [facility] Discharge Instructions  RECOMMENDATIONS MADE BY THE CONSULTANT AND ANY TEST RESULTS WILL BE SENT TO YOUR REFERRING PHYSICIAN.  We will start you on Opdivo. (immunotherapy)  Hildred Alamin to do teaching regarding Opdivo. (we can do over phone if you prefer)  Dr. Whitney Muse will see you back 1 week after 1st treatment with Opdivo.  Refills sent to [facility] for Flomax and Omeprazole.  We want you to do one stool card (hemoccult) to check for blood and one stool in a clear container for c-diff (checking for infection in your stool)    Thank you for choosing [place] at [facility] to provide your oncology and hematology care.  To afford each patient quality time with our provider, please arrive at least 15 minutes before your scheduled appointment time.    You need to re-schedule your appointment should you arrive 10 or more minutes late.  We strive to give you quality time with our providers, and arriving late affects you and other patients whose appointments are after yours.  Also, if you no show three or more times for appointments you may be dismissed from the clinic at the providers discretion.     Again, thank you for choosing [facility]  Our hope is that these requests will decrease the amount of time that you wait before being seen by our physicians.       _____________________________________________________________  Should you have questions after your visit to [facility], please contact our office at [phone] between the hours of 8:30 a.m. and 4:30 p.m.  Voicemails left after 4:30 p.m. will not be returned until the following business day.  For prescription refill requests, have your pharmacy contact our office.   Nivolumab injection What is this medicine? NIVOLUMAB (nye VOL ue mab) is used to treat certain types of melanoma and lung cancer. This medicine may be used for other purposes;  ask your health care provider or pharmacist if you have questions. COMMON BRAND NAME(S): Opdivo What should I tell my health care provider before I take this medicine? They need to know if you have any of these conditions: -eye disease, vision problems -history of pancreatitis -immune system problems -inflammatory bowel disease -kidney disease -liver disease -lung disease -lupus -myasthenia gravis -multiple sclerosis -organ transplant -stomach or intestine problems -thyroid disease -tingling of the fingers or toes, or other nerve disorder -an unusual or allergic reaction to nivolumab, other medicines, foods, dyes, or preservatives -pregnant or trying to get pregnant -breast-feeding How should I use this medicine? This medicine is for infusion into a vein. It is given by a health care professional in a hospital or clinic setting. A special MedGuide will be given to you before each treatment. Be sure to read this information carefully each time. Talk to your pediatrician regarding the use of this medicine in children. Special care may be needed. Overdosage: If you think you've taken too much of this medicine contact a poison control center or emergency room at once. Overdosage: If you think you have taken too much of this medicine contact a poison control center or emergency room at once. NOTE: This medicine is only for you. Do not share this medicine with others. What if I miss a dose? It is important not to miss your dose. Call your doctor or health care professional if you are unable to keep an appointment. What may interact with this medicine? Interactions have not  been studied. This list may not describe all possible interactions. Give your health care provider a list of all the medicines, herbs, non-prescription drugs, or dietary supplements you use. Also tell them if you smoke, drink alcohol, or use illegal drugs. Some items may interact with your medicine. What should I watch  for while using this medicine? Tell your doctor or healthcare professional if your symptoms do not start to get better or if they get worse. Your condition will be monitored carefully while you are receiving this medicine. You may need blood work done while you are taking this medicine. What side effects may I notice from receiving this medicine? Side effects that you should report to your doctor or health care professional as soon as possible: -allergic reactions like skin rash, itching or hives, swelling of the face, lips, or tongue -black, tarry stools -bloody or watery diarrhea -changes in vision -chills -cough -depressed mood -eye pain -feeling anxious -fever -general ill feeling or flu-like symptoms -hair loss -loss of appetite -low blood counts - this medicine may decrease the number of white blood cells, red blood cells and platelets. You may be at increased risk for infections and bleeding -pain, tingling, numbness in the hands or feet -redness, blistering, peeling or loosening of the skin, including inside the mouth -red pinpoint spots on skin -signs of decreased platelets or bleeding - bruising, pinpoint red spots on the skin, black, tarry stools, blood in the urine -signs of decreased red blood cells - unusually weak or tired, feeling faint or lightheaded, falls -signs of infection - fever or chills, cough, sore throat, pain or trouble passing urine -signs and symptoms of a dangerous change in heartbeat or heart rhythm like chest pain; dizziness; fast or irregular heartbeat; palpitations; feeling faint or lightheaded, falls; breathing problems -signs and symptoms of high blood sugar such as dizziness; dry mouth; dry skin; fruity breath; nausea; stomach pain; increased hunger or thirst; increased urination -signs and symptoms of kidney injury like trouble passing urine or change in the amount of urine -signs and symptoms of liver injury like dark yellow or brown urine; general  ill feeling or flu-like symptoms; light-colored stools; loss of appetite; nausea; right upper belly pain; unusually weak or tired; yellowing of the eyes or skin -signs and symptoms of increased potassium like muscle weakness; chest pain; or fast, irregular heartbeat -signs and symptoms of low potassium like muscle cramps or muscle pain; chest pain; dizziness; feeling faint or lightheaded, falls; palpitations; breathing problems; or fast, irregular heartbeat -swelling of the ankles, feet, hands -weight gainSide effects that usually do not require medical attention (report to your doctor or health care professional if they continue or are bothersome): -constipation -general ill feeling or flu-like symptoms -hair loss -loss of appetite -nausea, vomiting This list may not describe all possible side effects. Call your doctor for medical advice about side effects. You may report side effects to FDA at 1-800-FDA-1088. Where should I keep my medicine? This drug is given in a hospital or clinic and will not be stored at home. NOTE: This sheet is a summary. It may not cover all possible information. If you have questions about this medicine, talk to your doctor, pharmacist, or health care provider.  2015, Elsevier/Gold Standard. (2013-04-17 13:18:19) Fecal Occult Blood Test This is a test done on a stool specimen to screen for gastrointestinal bleeding, which may be an indicator of colon cancer Is is usually done as part of a routine examination, annually, after age 59 or  as directed by your caregiver. The fecal occult blood test (FOBT) checks for blood in your stool. Normally, there will not be enough blood lost through the gastrointestinal tract to turn an FOBT positive or for you to notice it visually in the form of bloody or dark, tarry stools. Any significant amount of blood being passed should be investigated.  A positive FOBT will tell your caregiver that you have bleeding occurring somewhere in your  gastrointestinal tract. This blood loss could be due to ulcers, diverticulosis, bleeding polyps, inflammatory bowel disease, hemorrhoids, from swallowed blood due to bleeding gums or nosebleeds, or it could be due to benign or cancerous tumors. Anything that protrudes into the lumen (the empty space in the intestine), like a polyp or tumor, and is rubbed against by the fecal waste as it passes through has the potential to eventually bleed intermittently. Often this small amount of blood is the first, and sometimes the only, symptom of early colon cancer, making the FOBT a valuable screening tool. PREPARATION FOR TEST  You should not eat red meat within three days before testing. Other substances that could cause a false positive test result include fish, turnips, horseradish, and drugs such as colchicines and oxidizing drugs (for example, iodine and boric acid). Be sure to carefully follow your caregiver's instructions. With FOBT, your caregiver or laboratory will give you one or more test "cards." You collect a separate sample from three different stools, usually on consecutive days. Each stool sample should be collected into a clean container and should not be contaminated with urine or water. The slide is labeled with your name and the date; then, with an applicator stick, you apply a thin smear of stool onto each filter paper square/window contained on the card. Allow the filter paper to dry. Once it is dry, it is stable. Usually you will collect all of the consecutive samples, and then return all of them to your caregiver or laboratory at the same time, sometimes by mailing them. There are also over the counter tests which are dropped in your toilet. NORMAL FINDINGS   No occult blood within the stool.  The FOBT test is normally negative. A positive indicates either blood in the stool or an interfering substance. Multiple samples are done to: 1) catch intermittent bleeding; and 2) help rule out false  positives. Ranges for normal findings may vary among different laboratories and hospitals. You should always check with your doctor after having lab work or other tests done to discuss the meaning of your test results and whether your values are considered within normal limits. MEANING OF TEST  Your caregiver will go over the test results with you and discuss the importance and meaning of your results, as well as treatment options and the need for additional tests if necessary. OBTAINING THE TEST RESULTS  It is your responsibility to obtain your test results. Ask the lab or department performing the test when and how you will get your results. Document Released: 02/21/2004 Document Revised: 04/20/2011 Document Reviewed: 01/06/2008 [facility] Patient Information 2015 [place], [place]. This information is not intended to replace advice given to you by your health care provider. Make sure you discuss any questions you have with your health care provider.

## 2014-09-18 MED ORDER — PREDNISONE 10 MG PO TABS: ORAL_TABLET | ORAL | Status: AC

## 2014-09-18 NOTE — Patient Instructions (Addendum)
[place] is an immunotherapy, which works with your own immune system to fight cancer.   Your immune system is normally your body's first defense against threats like lung cancer, but sometimes lung cancer sends a signal that can prevent your immune system from doing its job. Opdivo blocks this signal, helping to restore the immune response against the tumor cells. While doing so, Opdivo could also affect non-tumor cells. Because Opdivo works with your immune system, it may cause your immune system to attack normal organs and tissues in your body and can affect the way they work. These problems may happen anytime during or even after your treatment has ended.   Administration - this is a one hour infusion - no premeds needed. This medication is given every 2 weeks (14 days)   Most common Adverse Reactions: fatigue, shortness of breath, pain in muscles, bones, & joints, decreased appetite, cough, nausea, & constipation.  Things you need to watch for and alert [place] immediately if it is happening:   New or worsening cough, chest pain, shortness of breath, diarrhea (or more loose stools than usual), blood in your stools or dark tarry sticky stools, severe stomach pain or tenderness, yellowing of your skin/eyes, severe nausea/vomiting, pain on the right side of abdomen, drowsiness, dark urine (tea colored), bleeding/bruising more easily than usual, decrease in the amount of urine, blood in your urine, swelling in your ankles, loss of appetite, headaches that will not go away or unusual headaches, extreme tiredness, weight gain or weight loss, changes in mood/behavior, dizziness/fainting, Hair loss, feeling cold, voice getting deeper.   You can go to [email or web address] to find patient support resources or more information. Call 1-800-Opdivo-1 to contact a Care Navigator (in addition to [facility] staff). They are available by  phone M-F 8am-8pm.  Prescriptions Meds:  Zofran '8mg'$  tablet. Take 1 tablet every 8 hours as needed for nausea/vomiting. (#1 nausea med to take, this can constipate)  Compazine '10mg'$  tablet. Take 1 tablet every 6 hours as needed for nausea/vomiting. (#2 nausea med to take, this can make you sleepy)  EMLA cream. Apply a quarter size amount to port site 1 hour prior to chemo. Do not rub in. Cover with plastic wrap.   Over-the-Counter Meds:  Miralax 1 capful in 8 oz of fluid daily. May increase to two times a day if needed. This is a stool softener. If this doesn't work proceed you can add:  Senokot S  - start with 1 tablet two times a day and increase to 4 tablets two times a day if needed. (total of 8 tablets in a 24 hour period). This is a stimulant laxative.   Call us if this does not help your bowels move.         SYMPTOMS TO REPORT AS SOON AS POSSIBLE AFTER TREATMENT:  FEVER GREATER THAN 100.5 F  CHILLS WITH OR WITHOUT FEVER  NAUSEA AND VOMITING THAT IS NOT CONTROLLED WITH YOUR NAUSEA MEDICATION  UNUSUAL SHORTNESS OF BREATH  UNUSUAL BRUISING OR BLEEDING  TENDERNESS IN MOUTH AND THROAT WITH OR WITHOUT PRESENCE OF ULCERS  URINARY PROBLEMS  BOWEL PROBLEMS  UNUSUAL RASH    Wear comfortable clothing and clothing appropriate for easy access to any Portacath or PICC line. Let us know if there is anything that we can do to make your therapy better!      I have been informed and understand all of the  instructions given to me and have received a copy. I have been instructed to call the clinic [phone] or my family physician as soon as possible for continued medical care, if indicated. I do not have any more questions at this time but understand that I may call the [place] or the Patient Navigator at [phone] during office hours should I have questions or need assistance in obtaining follow-up care.           Nivolumab injection What is this  medicine? NIVOLUMAB (nye VOL ue mab) is used to treat certain types of melanoma and lung cancer. This medicine may be used for other purposes; ask your health care provider or pharmacist if you have questions. COMMON BRAND NAME(S): Opdivo What should I tell my health care provider before I take this medicine? They need to know if you have any of these conditions: -eye disease, vision problems -history of pancreatitis -immune system problems -inflammatory bowel disease -kidney disease -liver disease -lung disease -lupus -myasthenia gravis -multiple sclerosis -organ transplant -stomach or intestine problems -thyroid disease -tingling of the fingers or toes, or other nerve disorder -an unusual or allergic reaction to nivolumab, other medicines, foods, dyes, or preservatives -pregnant or trying to get pregnant -breast-feeding How should I use this medicine? This medicine is for infusion into a vein. It is given by a health care professional in a hospital or clinic setting. A special MedGuide will be given to you before each treatment. Be sure to read this information carefully each time. Talk to your pediatrician regarding the use of this medicine in children. Special care may be needed. Overdosage: If you think you've taken too much of this medicine contact a poison control center or emergency room at once. Overdosage: If you think you have taken too much of this medicine contact a poison control center or emergency room at once. NOTE: This medicine is only for you. Do not share this medicine with others. What if I miss a dose? It is important not to miss your dose. Call your doctor or health care professional if you are unable to keep an appointment. What may interact with this medicine? Interactions have not been studied. This list may not describe all possible interactions. Give your health care provider a list of all the medicines, herbs, non-prescription drugs, or dietary  supplements you use. Also tell them if you smoke, drink alcohol, or use illegal drugs. Some items may interact with your medicine. What should I watch for while using this medicine? Tell your doctor or healthcare professional if your symptoms do not start to get better or if they get worse. Your condition will be monitored carefully while you are receiving this medicine. You may need blood work done while you are taking this medicine. What side effects may I notice from receiving this medicine? Side effects that you should report to your doctor or health care professional as soon as possible: -allergic reactions like skin rash, itching or hives, swelling of the face, lips, or tongue -black, tarry stools -bloody or watery diarrhea -changes in vision -chills -cough -depressed mood -eye pain -feeling anxious -fever -general ill feeling or flu-like symptoms -hair loss -loss of appetite -low blood counts - this medicine may decrease the number of white blood cells, red blood cells and platelets. You may be at increased risk for infections and bleeding -pain, tingling, numbness in the hands or feet -redness, blistering, peeling or loosening of the skin, including inside the mouth -red pinpoint  spots on skin -signs of decreased platelets or bleeding - bruising, pinpoint red spots on the skin, black, tarry stools, blood in the urine -signs of decreased red blood cells - unusually weak or tired, feeling faint or lightheaded, falls -signs of infection - fever or chills, cough, sore throat, pain or trouble passing urine -signs and symptoms of a dangerous change in heartbeat or heart rhythm like chest pain; dizziness; fast or irregular heartbeat; palpitations; feeling faint or lightheaded, falls; breathing problems -signs and symptoms of high blood sugar such as dizziness; dry mouth; dry skin; fruity breath; nausea; stomach pain; increased hunger or thirst; increased urination -signs and symptoms of  kidney injury like trouble passing urine or change in the amount of urine -signs and symptoms of liver injury like dark yellow or brown urine; general ill feeling or flu-like symptoms; light-colored stools; loss of appetite; nausea; right upper belly pain; unusually weak or tired; yellowing of the eyes or skin -signs and symptoms of increased potassium like muscle weakness; chest pain; or fast, irregular heartbeat -signs and symptoms of low potassium like muscle cramps or muscle pain; chest pain; dizziness; feeling faint or lightheaded, falls; palpitations; breathing problems; or fast, irregular heartbeat -swelling of the ankles, feet, hands -weight gainSide effects that usually do not require medical attention (report to your doctor or health care professional if they continue or are bothersome): -constipation -general ill feeling or flu-like symptoms -hair loss -loss of appetite -nausea, vomiting This list may not describe all possible side effects. Call your doctor for medical advice about side effects. You may report side effects to FDA at 1-800-FDA-1088. Where should I keep my medicine? This drug is given in a hospital or clinic and will not be stored at home. NOTE: This sheet is a summary. It may not cover all possible information. If you have questions about this medicine, talk to your doctor, pharmacist, or health care provider.  2015, Elsevier/Gold Standard. (2013-04-17 13:18:19)

## 2014-09-19 ENCOUNTER — Encounter (HOSPITAL_COMMUNITY): Payer: Medicare Other

## 2014-09-19 DIAGNOSIS — C3492 Malignant neoplasm of unspecified part of left bronchus or lung: Secondary | ICD-10-CM

## 2014-09-20 ENCOUNTER — Ambulatory Visit (HOSPITAL_COMMUNITY): Payer: Self-pay | Admitting: Hematology & Oncology

## 2014-09-20 NOTE — Progress Notes (Signed)
Opdivo teaching done over the phone with patient, wife, and son on 09/19/14.. Will have patient sign consent on 09/21/14 when patient comes in for first [place].

## 2014-09-21 ENCOUNTER — Encounter (HOSPITAL_COMMUNITY): Payer: Self-pay

## 2014-09-21 ENCOUNTER — Encounter (HOSPITAL_BASED_OUTPATIENT_CLINIC_OR_DEPARTMENT_OTHER): Payer: Medicare Other

## 2014-09-21 VITALS — BP 109/65 | HR 63 | Temp 98.0°F | Resp 20 | Wt 123.2 lb

## 2014-09-21 DIAGNOSIS — Z5112 Encounter for antineoplastic immunotherapy: Secondary | ICD-10-CM

## 2014-09-21 DIAGNOSIS — C679 Malignant neoplasm of bladder, unspecified: Secondary | ICD-10-CM | POA: Diagnosis not present

## 2014-09-21 DIAGNOSIS — C3492 Malignant neoplasm of unspecified part of left bronchus or lung: Secondary | ICD-10-CM

## 2014-09-21 DIAGNOSIS — C3432 Malignant neoplasm of lower lobe, left bronchus or lung: Secondary | ICD-10-CM

## 2014-09-21 DIAGNOSIS — Z720 Tobacco use: Secondary | ICD-10-CM | POA: Diagnosis not present

## 2014-09-21 DIAGNOSIS — R319 Hematuria, unspecified: Secondary | ICD-10-CM | POA: Diagnosis not present

## 2014-09-21 LAB — COMPREHENSIVE METABOLIC PANEL
ALK PHOS: 62 U/L (ref 38–126)
ALT: 8 U/L — ABNORMAL LOW (ref 17–63)
AST: 14 U/L — ABNORMAL LOW (ref 15–41)
Albumin: 3.4 g/dL — ABNORMAL LOW (ref 3.5–5.0)
Anion gap: 5 (ref 5–15)
BUN: 11 mg/dL (ref 6–20)
CHLORIDE: 107 mmol/L (ref 101–111)
CO2: 29 mmol/L (ref 22–32)
CREATININE: 0.8 mg/dL (ref 0.61–1.24)
Calcium: 9.1 mg/dL (ref 8.9–10.3)
GFR calc Af Amer: >60 mL/min (ref >60)
GFR calc non Af Amer: >60 mL/min (ref >60)
Glucose, Bld: 104 mg/dL — ABNORMAL HIGH (ref 65–99)
Potassium: 3.4 mmol/L — ABNORMAL LOW (ref 3.5–5.1)
Sodium: 141 mmol/L (ref 135–145)
Total Bilirubin: 0.4 mg/dL (ref 0.3–1.2)
Total Protein: 6.5 g/dL (ref 6.5–8.1)

## 2014-09-21 LAB — CBC WITH DIFFERENTIAL/PLATELET
BASOS ABS: 0 10*3/uL (ref 0.0–0.1)
BASOS PCT: 0 % (ref 0–1)
EOS ABS: 0.1 10*3/uL (ref 0.0–0.7)
EOS PCT: 1 % (ref 0–5)
HCT: 37.7 % — ABNORMAL LOW (ref 39.0–52.0)
Hemoglobin: 12.5 g/dL — ABNORMAL LOW (ref 13.0–17.0)
Lymphocytes Relative: 19 % (ref 12–46)
Lymphs Abs: 1.1 10*3/uL (ref 0.7–4.0)
MCH: 33.7 pg (ref 26.0–34.0)
MCHC: 33.2 g/dL (ref 30.0–36.0)
MCV: 101.6 fL — AB (ref 78.0–100.0)
Monocytes Absolute: 0.8 10*3/uL (ref 0.1–1.0)
Monocytes Relative: 13 % — ABNORMAL HIGH (ref 3–12)
NEUTROS PCT: 66 % (ref 43–77)
Neutro Abs: 3.8 10*3/uL (ref 1.7–7.7)
PLATELETS: 143 10*3/uL — AB (ref 150–400)
RBC: 3.71 MIL/uL — ABNORMAL LOW (ref 4.22–5.81)
RDW: 12.9 % (ref 11.5–15.5)
WBC: 5.7 10*3/uL (ref 4.0–10.5)

## 2014-09-21 LAB — TSH: TSH: 1.95 u[IU]/mL (ref 0.350–4.500)

## 2014-09-21 MED ORDER — SODIUM CHLORIDE 0.9 % IV SOLN
3.0000 mg/kg | Freq: Once | INTRAVENOUS | Status: AC
  Administered 2014-09-21: 170 mg via INTRAVENOUS
  Filled 2014-09-21: qty 7

## 2014-09-21 MED ORDER — SODIUM CHLORIDE 0.9 % IJ SOLN: 10.0000 mL | INTRAMUSCULAR | Status: DC | PRN

## 2014-09-21 MED ORDER — HEPARIN SOD (PORK) LOCK FLUSH 100 UNIT/ML IV SOLN
500.0000 [IU] | Freq: Once | INTRAVENOUS | Status: AC | PRN
  Administered 2014-09-21: 500 [IU]

## 2014-09-21 MED ORDER — SODIUM CHLORIDE 0.9 % IV SOLN
Freq: Once | INTRAVENOUS | Status: AC
  Administered 2014-09-21: 13:00:00 via INTRAVENOUS

## 2014-09-21 NOTE — Patient Instructions (Signed)
[facility] Discharge Instructions for Patients Receiving Chemotherapy  Today you received the following chemotherapy agents opdivo Return as scheduled Please call the clinic if you have any questions or concerns  To help prevent nausea and vomiting after your treatment, we encourage you to take your nausea medication  If you develop nausea and vomiting, or diarrhea that is not controlled by your medication, call the clinic.  The clinic phone number is [phone]. Office hours are Monday-Friday 8:30am-5:00pm.  BELOW ARE SYMPTOMS THAT SHOULD BE REPORTED IMMEDIATELY:  *FEVER GREATER THAN 101.0 F  *CHILLS WITH OR WITHOUT FEVER  NAUSEA AND VOMITING THAT IS NOT CONTROLLED WITH YOUR NAUSEA MEDICATION  *UNUSUAL SHORTNESS OF BREATH  *UNUSUAL BRUISING OR BLEEDING  TENDERNESS IN MOUTH AND THROAT WITH OR WITHOUT PRESENCE OF ULCERS  *URINARY PROBLEMS  *BOWEL PROBLEMS  UNUSUAL RASH Items with * indicate a potential emergency and should be followed up as soon as possible. If you have an emergency after office hours please contact your primary care physician or go to the nearest emergency department.  Please call the clinic during office hours if you have any questions or concerns.   You may also contact the Patient Navigator at [phone] should you have any questions or need assistance in obtaining follow up care. _____________________________________________________________________ Have you asked about our STAR program?    STAR stands for Survivorship Training and Rehabilitation, and this is a nationally recognized cancer care program that focuses on survivorship and rehabilitation.  Cancer and cancer treatments may cause problems, such as, pain, making you feel tired and keeping you from doing the things that you need or want to do. Cancer rehabilitation can help. Our goal is to reduce these troubling effects and help you have the best quality of life  possible.  You may receive a survey from a nurse that asks questions about your current state of health.  Based on the survey results, all eligible patients will be referred to the [facility] program for an evaluation so we can better serve you! A frequently asked questions sheet is available upon request.

## 2014-09-21 NOTE — Progress Notes (Signed)
Linnell Fulling Tolerated chemotherapy well  When pt was being discharged pt felt a little weak, VS stable WNL, discharged via wheelchair with family  1610 Called pt to check to see if he was feeling better since he left and he stated that he was.

## 2014-09-24 ENCOUNTER — Encounter (HOSPITAL_BASED_OUTPATIENT_CLINIC_OR_DEPARTMENT_OTHER): Payer: Medicare Other | Admitting: Oncology

## 2014-09-24 VITALS — BP 106/72 | HR 89 | Temp 98.5°F | Resp 16 | Wt 121.4 lb

## 2014-09-24 DIAGNOSIS — C3492 Malignant neoplasm of unspecified part of left bronchus or lung: Secondary | ICD-10-CM

## 2014-09-24 NOTE — Progress Notes (Signed)
Brian Bogus, MD [place] [address]  Squamous cell carcinoma of left lung  CURRENT THERAPY: Nivolumab 3 mg/kg every 14 days beginning on 09/21/2014.  INTERVAL HISTORY: Brian Sims 64 y.o. 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 He also was treated for a Stage I NSCLC, squamous cell histology by Dr. [name].     Squamous cell carcinoma of left lung   10/25/2013 Initial Diagnosis T1N0 SCCA of the left lower lobe    Radiation Therapy    05/17/2014 Imaging CT chest- Persistent but improved appearance of left lower lobe lung mass. Small pulmonary nodules in the right lung are unchanged. No new or progressive disease identified.   08/30/2014 Progression CT chest- Significant enlargement of left lower lobe mass,   09/21/2014 -  Chemotherapy Nivolumab every 2 weeks    Bladder cancer   01/18/2014 Initial Diagnosis 1. Bladder, biopsy, right lateral wall of bladder - UROTHELIAL CARCINOMA IN SITU, NO STROMAL INVASION IDENTIFIED.  3. Prostate, TUR, deep prostatic tissue - HIGH GRADE UROTHELIAL CARCINOMA WITH ASSOCIATED TUMOR NECROSIS, INVOLVING PROSTATIC DUCTS    02/14/2014 - 05/03/2014 Chemotherapy Carboplatin/Gemzar   05/09/2014 - 06/28/2014 Radiation Therapy Pelvis and bladder boost by Dr. [name].   05/11/2014 Treatment Plan Change Change chemotherapy to weekly Carboplatin (AUC 2)   05/11/2014 - 05/31/2014 Chemotherapy Weekly Carboplatin with XRT at  AUC 2.   08/30/2014 Remission Ct abd.pelvis- Diffuse thickening of the urinary bladder, without a clearly identifiable bladder wall mass.    "This is worse than chemotherapy."  "Everyone told me it would be easier."  He reports some fatigue, to a greater degree than systemic, traditional, chemotherapy.    We spent some time discussing his treatment options.  Nivolumab is likely the most tolerable intervention at this time.  I would not recommend a change in  therapy at this time.  He clinically looks really good.  His wife discussed his diet and how she is going to incorporate more organic foods into his diet.     Past Medical History  Diagnosis Date  . COPD (chronic obstructive pulmonary disease)   . Inflammatory bowel disease   . Constipation   . Lesion of bladder   . Prostate mass   . Dysuria-frequency syndrome     wears depends  . Arthritis   . Shortness of breath   . Depression   . Anxiety   . Kidney stones   . GERD (gastroesophageal reflux disease)   . Cancer 2015    LT lung, bladder and prostate    has IBS (irritable bowel syndrome); Back pain; Heme positive stool; Squamous cell carcinoma of left lung; Bladder cancer; Tobacco abuse; Hematuria; and Neoplasm related pain on his problem list.     is allergic to lyrica; gabapentin; and morphine and related.  Current Outpatient Prescriptions on File Prior to Visit  Medication Sig Dispense Refill  . ALPRAZolam (XANAX) 1 MG tablet Take 0.5 mg by mouth every 2 (two) hours.     Marland Kitchen buPROPion (WELLBUTRIN XL) 150 MG 24 hr tablet Take 150 mg PO daily in AM x 4 days, then increase to 300 mg PO daily in AM 60 tablet 2  . Cholecalciferol (VITAMIN D3) 5000 UNITS CAPS Take 1 capsule by mouth daily.    Marland Kitchen Dextromethorphan Polistirex (DELSYM PO) Take 15 mLs by mouth as needed.    . docusate sodium (COLACE) 100 MG capsule Take 200 mg  by mouth at bedtime.    . hydrocortisone (PROCTOZONE-HC) 2.5 % rectal cream Place 1 application rectally 3 (three) times daily as needed for hemorrhoids (swelling).    Marland Kitchen lidocaine-prilocaine (EMLA) cream Apply 1 application topically as needed. 30 g 6  . magnesium hydroxide (MILK OF MAGNESIA) 400 MG/5ML suspension Take 30 mLs by mouth daily as needed for mild constipation.    . Meth-Hyo-M Bl-Na Phos-Ph Sal (URIBEL) 118 MG CAPS Take 118 mg by mouth 2 (two) times daily.     . mirtazapine (REMERON) 15 MG tablet TAKE 1 TABLET BY MOUTH AT BEDTIME WITH '45MG'$  TABLET FOR A  TOTAL DOSE OF '60MG'$ . 30 tablet 1  . mirtazapine (REMERON) 45 MG tablet Take 45 mg by mouth at bedtime.   11  . morphine (MS CONTIN) 15 MG 12 hr tablet Take 1 tablet (15 mg total) by mouth every 12 (twelve) hours. 60 tablet 0  . Multiple Vitamins-Minerals (MULTIVITAMIN WITH MINERALS) tablet Take 1 tablet by mouth every morning. [employer] Multi Vitamin    . Nivolumab (OPDIVO IV) Inject into the vein every 14 (fourteen) days.    . NON FORMULARY Take 250 mg by mouth daily. Grape concentrate -used as antioxident    . omeprazole (PRILOSEC) 20 MG capsule Take 1 capsule (20 mg total) by mouth 2 (two) times daily before a meal. 60 capsule 3  . ondansetron (ZOFRAN) 8 MG tablet Take 1 tablet (8 mg total) by mouth every 8 (eight) hours as needed for nausea or vomiting. 30 tablet 3  . oxyCODONE-acetaminophen (PERCOCET) 10-325 MG per tablet Take 1-2 tablets by mouth every 4 (four) hours as needed for pain. 150 tablet 0  . potassium chloride SA (K-DUR,KLOR-CON) 20 MEQ tablet Take 1 tablet (20 mEq total) by mouth daily. 30 tablet 1  . predniSONE (DELTASONE) 10 MG tablet Take 50 mg PO daily for side effects of Opdivo as reviewed in chemotherapy teaching. 120 tablet 0  . prochlorperazine (COMPAZINE) 10 MG tablet Take 1 tablet (10 mg total) by mouth every 6 (six) hours as needed for nausea or vomiting. 30 tablet 1  . Propylene Glycol (SYSTANE BALANCE OP) Place 1 drop into both eyes daily.    Marland Kitchen senna (SENOKOT) 8.6 MG TABS tablet Take 1 tablet by mouth 3 (three) times daily as needed for mild constipation.    . tamsulosin (FLOMAX) 0.4 MG CAPS capsule TAKE (1) CAPSULE BY MOUTH EVERY DAY. 30 capsule 1  . tamsulosin (FLOMAX) 0.4 MG CAPS capsule Take 1 capsule (0.4 mg total) by mouth daily. 30 capsule 3  . tiotropium (SPIRIVA) 18 MCG inhalation capsule Place 18 mcg into inhaler and inhale every evening.     . traZODone (DESYREL) 150 MG tablet Take 2 1/2 to three tablets at night for sleep and depression 90 tablet 3  .  vitamin C (ASCORBIC ACID) 500 MG tablet Take 500 mg by mouth daily.     No current facility-administered medications on file prior to visit.    Past Surgical History  Procedure Laterality Date  . Back surgery    . Left shoulder      arthroscopy  . Rt knee arthroscopy    . Colonoscopy N/A 01/11/2013    Procedure: COLONOSCOPY;  Surgeon: [name], MD;  Location: [facility];  Service: Endoscopy;  Laterality: N/A;  340  . Carpal tunnel release Right   . Transurethral resection of bladder tumor N/A 09/21/2013    Procedure: TRANSURETHRAL RESECTION  PROSTATE  AND BLADDER ;  Surgeon: [name], MD;  Location: [facility];  Service: Urology;  Laterality: N/A;  . Video bronchoscopy with endobronchial navigation Right 10/25/2013    Procedure: VIDEO BRONCHOSCOPY WITH ENDOBRONCHIAL NAVIGATION;  Surgeon: [name], MD;  Location: [place] [facility];  Service: Thoracic;  Laterality: Right;  . Transurethral resection of bladder tumor with gyrus (turbt-gyrus) N/A 01/18/2014    Procedure: TRANSURETHRAL RESECTION OF BLADDER TUMOR WITH GYRUS (TURBT-GYRUS);  Surgeon: [name], MD;  Location: [facility];  Service: Urology;  Laterality: N/A;  . Transurethral resection of prostate N/A 01/18/2014    Procedure: TRANSURETHRAL RESECTION OF THE PROSTATE (TURP);  Surgeon: [name], MD;  Location: [facility];  Service: Urology;  Laterality: N/A;    Denies any headaches, dizziness, double vision, fevers, chills, night sweats, nausea, vomiting, diarrhea, constipation, chest pain, heart palpitations, shortness of breath, blood in stool, black tarry stool, urinary pain, urinary burning, urinary frequency, hematuria.   PHYSICAL EXAMINATION  ECOG PERFORMANCE STATUS: 1 - Symptomatic but completely ambulatory  Filed Vitals:   09/24/14 1352  BP: 106/72  Pulse: 89  Temp: 98.5 F (36.9 C)  Resp: 16    GENERAL:alert, well nourished, well developed, comfortable, cooperative, smiling and blunted affect,  accompanied by wife and son. SKIN: skin color, texture, turgor are normal, no rashes or significant lesions HEAD: Normocephalic, No masses, lesions, tenderness or abnormalities EYES: normal, PERRLA, Conjunctiva are pink and non-injected EARS: External ears normal OROPHARYNX:lips, buccal mucosa, and tongue normal and mucous membranes are moist  NECK: supple, trachea midline LYMPH:  not examined BREAST:not examined LUNGS: clear to auscultation and percussion, decreased breath sounds HEART: regular rate & rhythm, no murmurs, no gallops, S1 normal and S2 normal ABDOMEN:abdomen soft, non-tender, normal bowel sounds and no masses or organomegaly BACK: No CVA tenderness EXTREMITIES:less then 2 second capillary refill, no joint deformities, effusion, or inflammation, no skin discoloration, no cyanosis  NEURO: alert & oriented x 3 with fluent speech, no focal motor/sensory deficits, gait normal   LABORATORY DATA: CBC    Component Value Date/Time   WBC 5.7 09/21/2014 1210   RBC 3.71* 09/21/2014 1210   HGB 12.5* 09/21/2014 1210   HCT 37.7* 09/21/2014 1210   PLT 143* 09/21/2014 1210   MCV 101.6* 09/21/2014 1210   MCH 33.7 09/21/2014 1210   MCHC 33.2 09/21/2014 1210   RDW 12.9 09/21/2014 1210   LYMPHSABS 1.1 09/21/2014 1210   MONOABS 0.8 09/21/2014 1210   EOSABS 0.1 09/21/2014 1210   BASOSABS 0.0 09/21/2014 1210      Chemistry      Component Value Date/Time   NA 141 09/21/2014 1210   K 3.4* 09/21/2014 1210   CL 107 09/21/2014 1210   CO2 29 09/21/2014 1210   BUN 11 09/21/2014 1210   CREATININE 0.80 09/21/2014 1210      Component Value Date/Time   CALCIUM 9.1 09/21/2014 1210   ALKPHOS 62 09/21/2014 1210   AST 14* 09/21/2014 1210   ALT 8* 09/21/2014 1210   BILITOT 0.4 09/21/2014 1210        PENDING LABS:   RADIOGRAPHIC STUDIES:  Ct Chest W Contrast  08/30/2014   CLINICAL DATA:  Subsequent evaluation of a 64 year old male previously diagnosed with lung cancer and  high-grade urothelial cancer with prostatic invasion in 2014 status post radiation therapy and chemotherapy.  EXAM: CT CHEST, ABDOMEN, AND PELVIS WITH CONTRAST  TECHNIQUE: Multidetector CT imaging of the chest, abdomen and pelvis was performed following the standard protocol during bolus  administration of intravenous contrast.  CONTRAST:  174m OMNIPAQUE IOHEXOL 300 MG/ML  SOLN  COMPARISON:  Chest CT 06/06/2014. CT of the abdomen and pelvis 01/29/2014.  FINDINGS: CT CHEST FINDINGS  Mediastinum/Lymph Nodes: Heart size is normal. Small amount of anterior pericardial fluid and/or thickening, unlikely to be of any hemodynamic significance at this time. No associated pericardial calcification. There is atherosclerosis of the thoracic aorta, the great vessels of the mediastinum and the coronary arteries, including calcified atherosclerotic plaque in the left main, left anterior descending, left circumflex and right coronary arteries. No pathologically enlarged mediastinal or hilar lymph nodes. Esophagus is unremarkable in appearance. No axillary lymphadenopathy.  Lungs/Pleura: Marked interval enlargement of the previously noted left lower lobe lesion, which is currently a 4.8 x 5.5 cm mass (image 34 of series 2). Adjacent to this lesion within the left lower lobe there are areas of ground-glass attenuation and septal thickening, likely to reflect postobstructive changes. No other new suspicious appearing pulmonary nodules or masses are noted. Small left pleural effusion. Mild diffuse bronchial wall thickening with mild centrilobular and paraseptal emphysema. Several tiny peribronchovascular micronodules are noted in the posterior aspect of the right upper lobe, similar to prior examinations, likely areas of mucoid impaction within terminal bronchioles.  Musculoskeletal/Soft Tissues: There are no aggressive appearing lytic or blastic lesions noted in the visualized portions of the skeleton.  CT ABDOMEN AND PELVIS FINDINGS   Hepatobiliary: 9 mm cyst in segment 2 of the liver is unchanged. Sub cm low-attenuation lesion in segment 4A is too small to definitively characterize, but is similar to prior examinations, favored to be benign, likely a small cyst. No new suspicious appearing hepatic lesions are otherwise noted. No intra or extrahepatic biliary ductal dilatation. Gallbladder is normal in appearance.  Pancreas: No pancreatic mass. No pancreatic ductal dilatation. No pancreatic or peripancreatic fluid or inflammatory changes.  Spleen: Unremarkable.  Adrenals/Urinary Tract: Sub cm low-attenuation lesion in the left kidney is too small to characterize, but unchanged compared to the prior examination, statistically likely a tiny cyst. Right kidney and bilateral adrenal glands are normal in appearance. No hydroureteronephrosis. Urinary bladder is largely decompressed. Urinary bladder wall appears diffusely thickened. No discrete bladder mass identified on today's examination (delayed images through the urinary bladder were not obtained).  Stomach/Bowel: Normal appearance of the stomach. No pathologic dilatation of small bowel or colon. Normal appendix.  Vascular/Lymphatic: Atherosclerosis throughout the abdominal and pelvic vasculature, without evidence of aneurysm or dissection. No lymphadenopathy noted in the abdomen or pelvis.  Reproductive: Small surgical defect in the superior aspect of the prostate gland extending into the urinary bladder, suggesting prior TURP. Prostate gland and seminal vesicles are otherwise unremarkable in appearance.  Other: No significant volume of ascites.  No pneumoperitoneum.  Musculoskeletal: Advanced discogenic changes at L1-L2 and L5-S1. There are no aggressive appearing lytic or blastic lesions noted in the visualized portions of the skeleton.  IMPRESSION: 1. Significant enlargement of left lower lobe mass, with worsening postobstructive changes in the left lower lobe, and new (likely malignant) small  left pleural effusion. 2. Diffuse thickening of the urinary bladder, without a clearly identifiable bladder wall mass. 3. No new signs of metastatic disease noted elsewhere in the chest, abdomen or pelvis. 4. Atherosclerosis, including left main and 3 vessel coronary artery disease. Assessment for potential risk factor modification, dietary therapy or pharmacologic therapy may be warranted, if clinically indicated. 5. Additional incidental findings, similar to prior examinations, as above.   Electronically Signed   By: DQuillian Quince  Entrikin M.D.   On: 08/30/2014 15:52   Ct Abdomen Pelvis W Contrast  08/30/2014   CLINICAL DATA:  Subsequent evaluation of a 64 year old male previously diagnosed with lung cancer and high-grade urothelial cancer with prostatic invasion in 2014 status post radiation therapy and chemotherapy.  EXAM: CT CHEST, ABDOMEN, AND PELVIS WITH CONTRAST  TECHNIQUE: Multidetector CT imaging of the chest, abdomen and pelvis was performed following the standard protocol during bolus administration of intravenous contrast.  CONTRAST:  162m OMNIPAQUE IOHEXOL 300 MG/ML  SOLN  COMPARISON:  Chest CT 06/06/2014. CT of the abdomen and pelvis 01/29/2014.  FINDINGS: CT CHEST FINDINGS  Mediastinum/Lymph Nodes: Heart size is normal. Small amount of anterior pericardial fluid and/or thickening, unlikely to be of any hemodynamic significance at this time. No associated pericardial calcification. There is atherosclerosis of the thoracic aorta, the great vessels of the mediastinum and the coronary arteries, including calcified atherosclerotic plaque in the left main, left anterior descending, left circumflex and right coronary arteries. No pathologically enlarged mediastinal or hilar lymph nodes. Esophagus is unremarkable in appearance. No axillary lymphadenopathy.  Lungs/Pleura: Marked interval enlargement of the previously noted left lower lobe lesion, which is currently a 4.8 x 5.5 cm mass (image 34 of series 2).  Adjacent to this lesion within the left lower lobe there are areas of ground-glass attenuation and septal thickening, likely to reflect postobstructive changes. No other new suspicious appearing pulmonary nodules or masses are noted. Small left pleural effusion. Mild diffuse bronchial wall thickening with mild centrilobular and paraseptal emphysema. Several tiny peribronchovascular micronodules are noted in the posterior aspect of the right upper lobe, similar to prior examinations, likely areas of mucoid impaction within terminal bronchioles.  Musculoskeletal/Soft Tissues: There are no aggressive appearing lytic or blastic lesions noted in the visualized portions of the skeleton.  CT ABDOMEN AND PELVIS FINDINGS  Hepatobiliary: 9 mm cyst in segment 2 of the liver is unchanged. Sub cm low-attenuation lesion in segment 4A is too small to definitively characterize, but is similar to prior examinations, favored to be benign, likely a small cyst. No new suspicious appearing hepatic lesions are otherwise noted. No intra or extrahepatic biliary ductal dilatation. Gallbladder is normal in appearance.  Pancreas: No pancreatic mass. No pancreatic ductal dilatation. No pancreatic or peripancreatic fluid or inflammatory changes.  Spleen: Unremarkable.  Adrenals/Urinary Tract: Sub cm low-attenuation lesion in the left kidney is too small to characterize, but unchanged compared to the prior examination, statistically likely a tiny cyst. Right kidney and bilateral adrenal glands are normal in appearance. No hydroureteronephrosis. Urinary bladder is largely decompressed. Urinary bladder wall appears diffusely thickened. No discrete bladder mass identified on today's examination (delayed images through the urinary bladder were not obtained).  Stomach/Bowel: Normal appearance of the stomach. No pathologic dilatation of small bowel or colon. Normal appendix.  Vascular/Lymphatic: Atherosclerosis throughout the abdominal and pelvic  vasculature, without evidence of aneurysm or dissection. No lymphadenopathy noted in the abdomen or pelvis.  Reproductive: Small surgical defect in the superior aspect of the prostate gland extending into the urinary bladder, suggesting prior TURP. Prostate gland and seminal vesicles are otherwise unremarkable in appearance.  Other: No significant volume of ascites.  No pneumoperitoneum.  Musculoskeletal: Advanced discogenic changes at L1-L2 and L5-S1. There are no aggressive appearing lytic or blastic lesions noted in the visualized portions of the skeleton.  IMPRESSION: 1. Significant enlargement of left lower lobe mass, with worsening postobstructive changes in the left lower lobe, and new (likely malignant) small left pleural effusion.  2. Diffuse thickening of the urinary bladder, without a clearly identifiable bladder wall mass. 3. No new signs of metastatic disease noted elsewhere in the chest, abdomen or pelvis. 4. Atherosclerosis, including left main and 3 vessel coronary artery disease. Assessment for potential risk factor modification, dietary therapy or pharmacologic therapy may be warranted, if clinically indicated. 5. Additional incidental findings, similar to prior examinations, as above.   Electronically Signed   By: Vinnie Langton M.D.   On: 08/30/2014 15:52     PATHOLOGY:    ASSESSMENT AND PLAN:  Squamous cell carcinoma of left lung S/P definitive radiation by Dr. [name].  Recent imaging demonstrated enlarging lesion.  Now on Nivolumab 3 mg/kg starting on 09/21/2014.  He reports that he did not tolerate it well due to increased fatigue.  He denies any nausea/vomiting, diarrhea, constipation, etc.  On conversation, his fatigue seems to be at baseline and truly no worse.  Pre-chemo labs as ordered  Nivolumab in 1 week for cycle 2.  Return in 3 weeks for follow-up.   We will plan on restaging on week 9 of therapy.    THERAPY PLAN:  Continue salvage treatment as planned.  All  questions were answered. The patient knows to call the clinic with any problems, questions or concerns. We can certainly see the patient much sooner if necessary.  Patient and plan discussed with Dr. Ancil Linsey and she is in agreement with the aforementioned.   This note is electronically signed by: Doy Mince 09/24/2014 4:35 PM

## 2014-09-24 NOTE — Assessment & Plan Note (Signed)
S/P definitive radiation by Dr. [name].  Recent imaging demonstrated enlarging lesion.  Now on Nivolumab 3 mg/kg starting on 09/21/2014.  He reports that he did not tolerate it well due to increased fatigue.  He denies any nausea/vomiting, diarrhea, constipation, etc.  On conversation, his fatigue seems to be at baseline and truly no worse.  Pre-chemo labs as ordered  Nivolumab in 1 week for cycle 2.  Return in 3 weeks for follow-up.   We will plan on restaging on week 9 of therapy.

## 2014-09-24 NOTE — Patient Instructions (Signed)
[place] at [facility] Discharge Instructions  RECOMMENDATIONS MADE BY THE CONSULTANT AND ANY TEST RESULTS WILL BE SENT TO YOUR REFERRING PHYSICIAN.  Continue with Opdivo as planned. MD appointment in 3 weeks with Opdivo. Return as scheduled.  Thank you for choosing [place] at [facility] to provide your oncology and hematology care.  To afford each patient quality time with our provider, please arrive at least 15 minutes before your scheduled appointment time.    You need to re-schedule your appointment should you arrive 10 or more minutes late.  We strive to give you quality time with our providers, and arriving late affects you and other patients whose appointments are after yours.  Also, if you no show three or more times for appointments you may be dismissed from the clinic at the providers discretion.     Again, thank you for choosing [facility].  Our hope is that these requests will decrease the amount of time that you wait before being seen by our physicians.       _____________________________________________________________  Should you have questions after your visit to [facility], please contact our office at [phone] between the hours of 8:30 a.m. and 4:30 p.m.  Voicemails left after 4:30 p.m. will not be returned until the following business day.  For prescription refill requests, have your pharmacy contact our office.

## 2014-10-03 ENCOUNTER — Encounter (HOSPITAL_BASED_OUTPATIENT_CLINIC_OR_DEPARTMENT_OTHER): Payer: Medicare Other

## 2014-10-03 VITALS — BP 106/69 | HR 78 | Temp 98.3°F | Resp 16 | Wt 120.0 lb

## 2014-10-03 DIAGNOSIS — Z5112 Encounter for antineoplastic immunotherapy: Secondary | ICD-10-CM | POA: Diagnosis not present

## 2014-10-03 DIAGNOSIS — C3492 Malignant neoplasm of unspecified part of left bronchus or lung: Secondary | ICD-10-CM

## 2014-10-03 DIAGNOSIS — C679 Malignant neoplasm of bladder, unspecified: Secondary | ICD-10-CM | POA: Diagnosis not present

## 2014-10-03 DIAGNOSIS — C3432 Malignant neoplasm of lower lobe, left bronchus or lung: Secondary | ICD-10-CM | POA: Diagnosis not present

## 2014-10-03 LAB — COMPREHENSIVE METABOLIC PANEL
ALT: 8 U/L — ABNORMAL LOW (ref 17–63)
ANION GAP: 5 (ref 5–15)
AST: 12 U/L — ABNORMAL LOW (ref 15–41)
Albumin: 3.4 g/dL — ABNORMAL LOW (ref 3.5–5.0)
Alkaline Phosphatase: 61 U/L (ref 38–126)
BUN: 8 mg/dL (ref 6–20)
CO2: 30 mmol/L (ref 22–32)
CREATININE: 0.75 mg/dL (ref 0.61–1.24)
Calcium: 8.9 mg/dL (ref 8.9–10.3)
Chloride: 104 mmol/L (ref 101–111)
GFR calc non Af Amer: >60 mL/min (ref >60)
Glucose, Bld: 110 mg/dL — ABNORMAL HIGH (ref 65–99)
POTASSIUM: 3.5 mmol/L (ref 3.5–5.1)
SODIUM: 139 mmol/L (ref 135–145)
Total Bilirubin: 0.4 mg/dL (ref 0.3–1.2)
Total Protein: 6.4 g/dL — ABNORMAL LOW (ref 6.5–8.1)

## 2014-10-03 LAB — CBC WITH DIFFERENTIAL/PLATELET
Basophils Absolute: 0 10*3/uL (ref 0.0–0.1)
Basophils Relative: 0 % (ref 0–1)
EOS ABS: 0.1 10*3/uL (ref 0.0–0.7)
EOS PCT: 2 % (ref 0–5)
HCT: 38.6 % — ABNORMAL LOW (ref 39.0–52.0)
Hemoglobin: 12.8 g/dL — ABNORMAL LOW (ref 13.0–17.0)
LYMPHS ABS: 1.1 10*3/uL (ref 0.7–4.0)
LYMPHS PCT: 20 % (ref 12–46)
MCH: 33.3 pg (ref 26.0–34.0)
MCHC: 33.2 g/dL (ref 30.0–36.0)
MCV: 100.5 fL — ABNORMAL HIGH (ref 78.0–100.0)
MONO ABS: 0.7 10*3/uL (ref 0.1–1.0)
Monocytes Relative: 14 % — ABNORMAL HIGH (ref 3–12)
Neutro Abs: 3.4 10*3/uL (ref 1.7–7.7)
Neutrophils Relative %: 64 % (ref 43–77)
PLATELETS: 160 10*3/uL (ref 150–400)
RBC: 3.84 MIL/uL — AB (ref 4.22–5.81)
RDW: 12.9 % (ref 11.5–15.5)
WBC: 5.4 10*3/uL (ref 4.0–10.5)

## 2014-10-03 MED ORDER — HEPARIN SOD (PORK) LOCK FLUSH 100 UNIT/ML IV SOLN
500.0000 [IU] | Freq: Once | INTRAVENOUS | Status: AC | PRN
  Administered 2014-10-03: 500 [IU]
  Filled 2014-10-03: qty 5

## 2014-10-03 MED ORDER — SODIUM CHLORIDE 0.9 % IV SOLN
3.0000 mg/kg | Freq: Once | INTRAVENOUS | Status: AC
  Administered 2014-10-03: 170 mg via INTRAVENOUS
  Filled 2014-10-03: qty 7

## 2014-10-03 MED ORDER — SODIUM CHLORIDE 0.9 % IJ SOLN: 10.0000 mL | INTRAMUSCULAR | Status: DC | PRN

## 2014-10-03 MED ORDER — SODIUM CHLORIDE 0.9 % IV SOLN
Freq: Once | INTRAVENOUS | Status: AC
  Administered 2014-10-03: 12:00:00 via INTRAVENOUS

## 2014-10-03 NOTE — Progress Notes (Signed)
Patient tolerated infusion well.  VSS 30 minutes post infusion.   

## 2014-10-04 ENCOUNTER — Inpatient Hospital Stay (HOSPITAL_COMMUNITY): Payer: Self-pay

## 2014-10-05 ENCOUNTER — Inpatient Hospital Stay (HOSPITAL_COMMUNITY): Payer: Self-pay

## 2014-10-17 ENCOUNTER — Encounter (HOSPITAL_COMMUNITY): Payer: Self-pay | Admitting: *Deleted

## 2014-10-17 NOTE — Progress Notes (Signed)
PDL1 sent off on [phone]

## 2014-10-18 ENCOUNTER — Other Ambulatory Visit (HOSPITAL_COMMUNITY)
Admission: RE | Admit: 2014-10-18 | Discharge: 2014-10-18 | Disposition: A | Discharge status: Home / Self Care | Payer: Medicare Other | Source: Ambulatory Visit | Attending: Hematology & Oncology | Admitting: Hematology & Oncology

## 2014-10-18 DIAGNOSIS — C3492 Malignant neoplasm of unspecified part of left bronchus or lung: Secondary | ICD-10-CM | POA: Insufficient documentation

## 2014-10-19 ENCOUNTER — Encounter (HOSPITAL_COMMUNITY): Payer: Self-pay | Admitting: Hematology & Oncology

## 2014-10-19 ENCOUNTER — Encounter (HOSPITAL_BASED_OUTPATIENT_CLINIC_OR_DEPARTMENT_OTHER): Payer: Medicare Other | Admitting: Hematology & Oncology

## 2014-10-19 ENCOUNTER — Encounter (HOSPITAL_COMMUNITY): Payer: Medicare Other | Attending: Oncology

## 2014-10-19 ENCOUNTER — Inpatient Hospital Stay (HOSPITAL_COMMUNITY): Payer: Self-pay

## 2014-10-19 ENCOUNTER — Ambulatory Visit (HOSPITAL_COMMUNITY): Payer: Self-pay | Admitting: Hematology & Oncology

## 2014-10-19 VITALS — BP 94/63 | HR 84 | Temp 98.5°F | Resp 18 | Wt 121.3 lb

## 2014-10-19 VITALS — BP 112/73 | HR 56 | Temp 97.9°F | Resp 16

## 2014-10-19 DIAGNOSIS — Z5112 Encounter for antineoplastic immunotherapy: Secondary | ICD-10-CM | POA: Diagnosis not present

## 2014-10-19 DIAGNOSIS — C3492 Malignant neoplasm of unspecified part of left bronchus or lung: Secondary | ICD-10-CM

## 2014-10-19 DIAGNOSIS — C679 Malignant neoplasm of bladder, unspecified: Secondary | ICD-10-CM | POA: Diagnosis present

## 2014-10-19 DIAGNOSIS — R319 Hematuria, unspecified: Secondary | ICD-10-CM | POA: Insufficient documentation

## 2014-10-19 DIAGNOSIS — D519 Vitamin B12 deficiency anemia, unspecified: Secondary | ICD-10-CM

## 2014-10-19 DIAGNOSIS — K648 Other hemorrhoids: Secondary | ICD-10-CM

## 2014-10-19 DIAGNOSIS — Z720 Tobacco use: Secondary | ICD-10-CM | POA: Diagnosis not present

## 2014-10-19 DIAGNOSIS — C3432 Malignant neoplasm of lower lobe, left bronchus or lung: Secondary | ICD-10-CM | POA: Diagnosis not present

## 2014-10-19 DIAGNOSIS — D518 Other vitamin B12 deficiency anemias: Secondary | ICD-10-CM

## 2014-10-19 DIAGNOSIS — R5383 Other fatigue: Secondary | ICD-10-CM | POA: Diagnosis not present

## 2014-10-19 LAB — CBC WITH DIFFERENTIAL/PLATELET
BASOS ABS: 0 10*3/uL (ref 0.0–0.1)
Basophils Relative: 0 % (ref 0–1)
EOS ABS: 0.1 10*3/uL (ref 0.0–0.7)
Eosinophils Relative: 2 % (ref 0–5)
HCT: 39.4 % (ref 39.0–52.0)
HEMOGLOBIN: 13.2 g/dL (ref 13.0–17.0)
LYMPHS ABS: 1.1 10*3/uL (ref 0.7–4.0)
LYMPHS PCT: 21 % (ref 12–46)
MCH: 33.2 pg (ref 26.0–34.0)
MCHC: 33.5 g/dL (ref 30.0–36.0)
MCV: 99.2 fL (ref 78.0–100.0)
Monocytes Absolute: 0.6 10*3/uL (ref 0.1–1.0)
Monocytes Relative: 12 % (ref 3–12)
NEUTROS PCT: 65 % (ref 43–77)
Neutro Abs: 3.6 10*3/uL (ref 1.7–7.7)
Platelets: 158 10*3/uL (ref 150–400)
RBC: 3.97 MIL/uL — AB (ref 4.22–5.81)
RDW: 13.1 % (ref 11.5–15.5)
WBC: 5.5 10*3/uL (ref 4.0–10.5)

## 2014-10-19 LAB — COMPREHENSIVE METABOLIC PANEL
ALK PHOS: 69 U/L (ref 38–126)
ALT: 7 U/L — AB (ref 17–63)
AST: 14 U/L — ABNORMAL LOW (ref 15–41)
Albumin: 3.3 g/dL — ABNORMAL LOW (ref 3.5–5.0)
Anion gap: 5 (ref 5–15)
BUN: 8 mg/dL (ref 6–20)
CALCIUM: 9 mg/dL (ref 8.9–10.3)
CO2: 31 mmol/L (ref 22–32)
Chloride: 103 mmol/L (ref 101–111)
Creatinine, Ser: 0.74 mg/dL (ref 0.61–1.24)
GFR calc non Af Amer: >60 mL/min (ref >60)
GLUCOSE: 102 mg/dL — AB (ref 65–99)
Potassium: 3.3 mmol/L — ABNORMAL LOW (ref 3.5–5.1)
Sodium: 139 mmol/L (ref 135–145)
Total Bilirubin: 0.4 mg/dL (ref 0.3–1.2)
Total Protein: 6.6 g/dL (ref 6.5–8.1)

## 2014-10-19 LAB — VITAMIN B12: VITAMIN B 12: 339 pg/mL (ref 180–914)

## 2014-10-19 LAB — TSH: TSH: 2.987 u[IU]/mL (ref 0.350–4.500)

## 2014-10-19 LAB — FOLATE: Folate: 18.3 ng/mL (ref >5.9)

## 2014-10-19 MED ORDER — SODIUM CHLORIDE 0.9 % IV SOLN
Freq: Once | INTRAVENOUS | Status: AC
  Administered 2014-10-19: 13:00:00 via INTRAVENOUS

## 2014-10-19 MED ORDER — HYDROCORTISONE ACETATE 25 MG RE SUPP: 25.0000 mg | Freq: Two times a day (BID) | RECTAL | Status: DC

## 2014-10-19 MED ORDER — POTASSIUM CHLORIDE CRYS ER 20 MEQ PO TBCR
40.0000 meq | EXTENDED_RELEASE_TABLET | Freq: Once | ORAL | Status: AC
  Administered 2014-10-19: 40 meq via ORAL
  Filled 2014-10-19: qty 2

## 2014-10-19 MED ORDER — HEPARIN SOD (PORK) LOCK FLUSH 100 UNIT/ML IV SOLN
500.0000 [IU] | Freq: Once | INTRAVENOUS | Status: AC | PRN
  Administered 2014-10-19: 500 [IU]

## 2014-10-19 MED ORDER — LUBIPROSTONE 8 MCG PO CAPS: ORAL_CAPSULE | ORAL | Status: AC

## 2014-10-19 MED ORDER — SODIUM CHLORIDE 0.9 % IV SOLN
3.0000 mg/kg | Freq: Once | INTRAVENOUS | Status: AC
  Administered 2014-10-19: 170 mg via INTRAVENOUS
  Filled 2014-10-19: qty 7

## 2014-10-19 MED ORDER — SODIUM CHLORIDE 0.9 % IJ SOLN
10.0000 mL | INTRAMUSCULAR | Status: DC | PRN
Start: 2014-10-19 — End: 2014-10-19
  Administered 2014-10-19: 10 mL
  Filled 2014-10-19: qty 10

## 2014-10-19 NOTE — Patient Instructions (Signed)
..  [facility] Discharge Instructions for Patients Receiving Chemotherapy  Today you received the following chemotherapy agent: Opdivo.    To help prevent nausea and vomiting after your treatment, we encourage you to take your nausea medication Zofran '8mg'$  every 8 hours as needed for nausea or vomiting Begin taking it as needed and take it as often as prescribed for the next 48 hours. Also start taking the potassium daily as prescribed.  Start taking this tomorrow because we gave you today's dose during today's visit.     If you develop nausea and vomiting, or diarrhea that is not controlled by your medication, call the clinic.  The clinic phone number is [phone]. Office hours are Monday-Friday 8:30am-5:00pm.  BELOW ARE SYMPTOMS THAT SHOULD BE REPORTED IMMEDIATELY:  *FEVER GREATER THAN 101.0 F  *CHILLS WITH OR WITHOUT FEVER  NAUSEA AND VOMITING THAT IS NOT CONTROLLED WITH YOUR NAUSEA MEDICATION  *UNUSUAL SHORTNESS OF BREATH  *UNUSUAL BRUISING OR BLEEDING  TENDERNESS IN MOUTH AND THROAT WITH OR WITHOUT PRESENCE OF ULCERS  *URINARY PROBLEMS  *BOWEL PROBLEMS  UNUSUAL RASH Items with * indicate a potential emergency and should be followed up as soon as possible. If you have an emergency after office hours please contact your primary care physician or go to the nearest emergency department.  Please call the clinic during office hours if you have any questions or concerns.   You may also contact the Patient Navigator at [phone] should you have any questions or need assistance in obtaining follow up care. _____________________________________________________________________ Have you asked about our STAR program?    STAR stands for Survivorship Training and Rehabilitation, and this is a nationally recognized cancer care program that focuses on survivorship and rehabilitation.  Cancer and cancer treatments may cause problems, such as, pain, making you feel  tired and keeping you from doing the things that you need or want to do. Cancer rehabilitation can help. Our goal is to reduce these troubling effects and help you have the best quality of life possible.  You may receive a survey from a nurse that asks questions about your current state of health.  Based on the survey results, all eligible patients will be referred to the [facility] program for an evaluation so we can better serve you! A frequently asked questions sheet is available upon request.

## 2014-10-19 NOTE — Patient Instructions (Signed)
[place] at [facility] Discharge Instructions  RECOMMENDATIONS MADE BY THE CONSULTANT AND ANY TEST RESULTS WILL BE SENT TO YOUR REFERRING PHYSICIAN.  1.  Return in 2 weeks for an office visit.  Chemo visits as scheduled.  Thank you for choosing [place] at [facility] to provide your oncology and hematology care.  To afford each patient quality time with our provider, please arrive at least 15 minutes before your scheduled appointment time.    You need to re-schedule your appointment should you arrive 10 or more minutes late.  We strive to give you quality time with our providers, and arriving late affects you and other patients whose appointments are after yours.  Also, if you no show three or more times for appointments you may be dismissed from the clinic at the providers discretion.     Again, thank you for choosing [facility].  Our hope is that these requests will decrease the amount of time that you wait before being seen by our physicians.       _____________________________________________________________  Should you have questions after your visit to [facility], please contact our office at [phone] between the hours of 8:30 a.m. and 4:30 p.m.  Voicemails left after 4:30 p.m. will not be returned until the following business day.  For prescription refill requests, have your pharmacy contact our office.

## 2014-10-19 NOTE — Progress Notes (Signed)
MD aware of potassium level, order obtained for PO potassium during visit today.  Potassium given without difficulty.  Patient and wife also aware that Dr. Kacia Halley Muse would like for him to start taking the potassium 73mq daily at home, and the prescription has been sent to their pharmacy along with other medications discussed at his MD appointment today.  Wife aware.    Patient tolerated infusion well.  VSS post infusion.

## 2014-10-19 NOTE — Progress Notes (Signed)
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 He also was treated for a Stage I NSCLC, squamous cell histology by Dr. [name]. He has started treatment with nivolumab.  Brian Sims is here today with family, wife and son.  He says the Trazodone helped him and "took care of one problem."  When asked about his bowel habits, he states that they "change about, so to speak." He says he may wake up any time with stool "all in his britches." He states that this happens periodically. He also notes that he takes stool softeners frequently because if he does not he "cannot go at all." He states that he has days where in spite of stool softeners his stool starts out as "hard balls." The patient has been seen in consultation at [facility] [place], and [place] since 2011 for his ongoing bowel issues. He does report though that his bowels are no different since starting his current therapy.  His wife states that he has "horrific rectal pain" and that it "burns like fire." He has a history of hemorrhoids treated with Dr. Romona Curls.  The family is having clear issues coping with Brian Sims condition. As a group, they are experiencing psychological issues and denial which continue to be unaddressed.    Squamous cell carcinoma of left lung   10/25/2013 Initial Diagnosis T1N0 SCCA of the left lower lobe    Radiation Therapy    05/17/2014 Imaging CT chest- Persistent but improved  appearance of left lower lobe lung mass. Small pulmonary nodules in the right lung are unchanged. No new or progressive disease identified.   08/30/2014 Progression CT chest- Significant enlargement of left lower lobe mass,   09/21/2014 -  Chemotherapy Nivolumab every 2 weeks    Bladder cancer   01/18/2014 Initial Diagnosis 1. Bladder, biopsy, right lateral wall of bladder - UROTHELIAL CARCINOMA IN SITU, NO STROMAL INVASION IDENTIFIED.  3. Prostate, TUR, deep prostatic tissue - HIGH GRADE UROTHELIAL CARCINOMA WITH ASSOCIATED TUMOR NECROSIS, INVOLVING PROSTATIC DUCTS    02/14/2014 - 05/03/2014 Chemotherapy Carboplatin/Gemzar   05/09/2014 - 06/28/2014 Radiation Therapy Pelvis and bladder boost by Dr. [name].   05/11/2014 Treatment Plan Change Change chemotherapy to weekly Carboplatin (AUC 2)   05/11/2014 - 05/31/2014 Chemotherapy Weekly Carboplatin with XRT at  AUC 2.   08/30/2014 Remission Ct abd.pelvis- Diffuse thickening of the urinary bladder, without a clearly identifiable bladder wall mass.     Past Medical History  Diagnosis Date  . COPD (chronic obstructive pulmonary disease)   . Inflammatory bowel disease   . Constipation   . Lesion of bladder   . Prostate mass   . Dysuria-frequency syndrome     wears depends  . Arthritis   . Shortness of breath   . Depression   . Anxiety   . Kidney stones   . GERD (gastroesophageal reflux disease)   . Cancer 2015    LT lung, bladder and prostate    has IBS (irritable bowel syndrome); Back pain; Heme positive stool; Squamous  cell carcinoma of left lung; Bladder cancer; Tobacco abuse; Hematuria; and Neoplasm related pain on his problem list.     is allergic to lyrica; gabapentin; and morphine and related.  Brian Sims does not currently have medications on file.  Past Surgical History  Procedure Laterality Date  . Back surgery    . Left shoulder      arthroscopy  . Rt knee arthroscopy    . Colonoscopy N/A 01/11/2013    Procedure: COLONOSCOPY;   Surgeon: [name], MD;  Location: [facility];  Service: Endoscopy;  Laterality: N/A;  340  . Carpal tunnel release Right   . Transurethral resection of bladder tumor N/A 09/21/2013    Procedure: TRANSURETHRAL RESECTION  PROSTATE  AND BLADDER ;  Surgeon: [name], MD;  Location: [facility];  Service: Urology;  Laterality: N/A;  . Video bronchoscopy with endobronchial navigation Right 10/25/2013    Procedure: VIDEO BRONCHOSCOPY WITH ENDOBRONCHIAL NAVIGATION;  Surgeon: [name], MD;  Location: [facility];  Service: Thoracic;  Laterality: Right;  . Transurethral resection of bladder tumor with gyrus (turbt-gyrus) N/A 01/18/2014    Procedure: TRANSURETHRAL RESECTION OF BLADDER TUMOR WITH GYRUS (TURBT-GYRUS);  Surgeon: [name], MD;  Location: [facility];  Service: Urology;  Laterality: N/A;  . Transurethral resection of prostate N/A 01/18/2014    Procedure: TRANSURETHRAL RESECTION OF THE PROSTATE (TURP);  Surgeon: [name], MD;  Location: [facility];  Service: Urology;  Laterality: N/A;    Denies any headaches, dizziness, double vision, fevers, chills, night sweats, nausea, vomiting, constipation, chest pain, heart palpitations, shortness of breath, blood in stool, black tarry stool, urinary pain, urinary burning, urinary frequency, hematuria. Positive for diarrhea.  14 point review of systems was performed and is negative except as detailed under history of present illness and above    PHYSICAL EXAMINATION  ECOG PERFORMANCE STATUS: 1 - Symptomatic but completely ambulatory  Filed Vitals:   10/19/14 1136  BP: 94/63  Pulse: 84  Temp: 98.5 F (36.9 C)  Resp: 18    GENERAL:alert, no distress, comfortable, cooperative and chronically ill appearing, accompanied by wife and son. Walks to the exam table without assistance SKIN: skin color, texture, turgor are normal, no rashes or significant lesions  Skin breakdown/pressure sore on his side and hip from lying in  bed too often. Stage I, approx 2 cm in size HEAD: Normocephalic, No masses, lesions, tenderness or abnormalities EYES: normal, PERRLA EARS: External ears normal OROPHARYNX:lips, buccal mucosa, and tongue normal and mucous membranes are moist  NECK: supple, trachea midline LYMPH: no palpable adenopathy in the neck or supraclavicular regions BREAST:not examined LUNGS: decreased breath sounds HEART: regular rate & rhythm, no murmurs and no gallops ABDOMEN:abdomen soft and normal bowel sounds, wearing adult depends BACK: Back symmetric, no curvature. EXTREMITIES:less then 2 second capillary refill, no joint deformities, effusion, or inflammation, no skin discoloration, no cyanosis  NEURO: alert & oriented x 3 with fluent speech, no focal motor/sensory deficits   RADIOLOGY: I have reviewed the radiology images listed below and agree with the results CLINICAL DATA: Subsequent evaluation of a 64 year old male previously diagnosed with lung cancer and high-grade urothelial cancer with prostatic invasion in 2014 status post radiation therapy and chemotherapy.  EXAM: CT CHEST, ABDOMEN, AND PELVIS WITH CONTRAST  TECHNIQUE: Multidetector CT imaging of the chest, abdomen and pelvis was performed following the standard protocol during bolus administration of intravenous contrast.  CONTRAST: 148m OMNIPAQUE IOHEXOL 300 MG/ML SOLN  COMPARISON:  Chest CT 06/06/2014. CT of the abdomen and pelvis 01/29/2014.  FINDINGS: CT CHEST FINDINGS IMPRESSION: 1. Significant enlargement of left lower lobe mass, with worsening postobstructive changes in the left lower lobe, and new (likely malignant) small left pleural effusion. 2. Diffuse thickening of the urinary bladder, without a clearly identifiable bladder wall mass. 3. No new signs of metastatic disease noted elsewhere in the chest, abdomen or pelvis. 4. Atherosclerosis, including left main and 3 vessel coronary artery disease.  Assessment for potential risk factor modification, dietary therapy or pharmacologic therapy may be warranted, if clinically indicated. 5. Additional incidental findings, similar to prior examinations, as above.   Electronically Signed  By: Vinnie Langton M.D.  On: 08/30/2014 15:52   LABORATORY DATA: I have reviewed the laboratory studies listed below  CBC    Component Value Date/Time   WBC 5.5 10/19/2014 1300   RBC 3.97* 10/19/2014 1300   HGB 13.2 10/19/2014 1300   HCT 39.4 10/19/2014 1300   PLT 158 10/19/2014 1300   MCV 99.2 10/19/2014 1300   MCH 33.2 10/19/2014 1300   MCHC 33.5 10/19/2014 1300   RDW 13.1 10/19/2014 1300   LYMPHSABS 1.1 10/19/2014 1300   MONOABS 0.6 10/19/2014 1300   EOSABS 0.1 10/19/2014 1300   BASOSABS 0.0 10/19/2014 1300    CMP     Component Value Date/Time   NA 139 10/19/2014 1300   K 3.3* 10/19/2014 1300   CL 103 10/19/2014 1300   CO2 31 10/19/2014 1300   GLUCOSE 102* 10/19/2014 1300   BUN 8 10/19/2014 1300   CREATININE 0.74 10/19/2014 1300   CALCIUM 9.0 10/19/2014 1300   PROT 6.6 10/19/2014 1300   ALBUMIN 3.3* 10/19/2014 1300   AST 14* 10/19/2014 1300   ALT 7* 10/19/2014 1300   ALKPHOS 69 10/19/2014 1300   BILITOT 0.4 10/19/2014 1300   GFRNONAA >60 10/19/2014 1300   GFRAA >60 10/19/2014 1300     ASSESSMENT AND PLAN:   Stage I non-small cell lung cancer  CT imaging shows progression of disease in the left chest. He has met with Dr. [name]. Also concurs that the patient should try immunotherapy. I do think he would tolerate this well. The family has already bed on several options and are aware of some of the side effects including pneumonitis and colitis. He is not having trouble tolerating Nivolumab. I do however feel that her best option moving forward would be to discontinue therapy. He has a poor quality of life. We have discussed this many times in the past. I have arranged for them to meet with our palliative care team at  his next return to further discuss goals of care. In spite of ongoing conversations over the last several months I have been unable to make little progress with the patient and his family.   Depression I believe this is a significant component of many of his problems. He is finally taking his trazodone as prescribed and is sleeping better. He is taking Remeron regularly.He had an appointment with behavioral health but did not go.  He continues on trazodone and remeron. His Trazodone was refilled at his last visit.   Pelvic pain Chronic, Unchanged.  Bladder cancer  He has not had follow-up with [facility]. At this point I am not sure of the benefit. His bladder cancer was not seen on imaging studies, clinically he does not appear to have progressive disease.   Diarrhea  He has had chronic GI issues ranging from constipation, to pain and now  diarrhea of several weeks duration. We will test for C. difficile and I have also given him a Hemoccult card. I ordered the prior studies but he has yet to turn in the stool samples.  I emphasized to the patient today that it is important he turn these in.  He takes a variety of stool softeners. If he does not take them he says he gets constipated. His diarrhea is unchanged pre-and post nivolumab. I do not think his diarrhea is related in any way to his current drug therapy. I recommended trying him on amitiza. I advised him to hold off on all the stool softeners and laxatives which he takes.  He notes that his rectal pain occurs with bowel movements. He has a known history of hemorrhoids, his wife states he saw Dr. Romona Curls many times in the past for hemorrhoid related issues. I have also called him in [facility]  suppositories to use for several days. Recommended referral back to Dr. Laural Golden, they have seen him in the past.  Declining PS Again readdressed goals of care. We discussed discontinuing care as I feel the patient's to spend the majority of their time in bed  with poor performance status should not be getting active therapy. I feel that palliative care/hospice may benefit his quality of life greatly. He has not appointment arranged with palliative care at his next visit here. I have offered home health for consideration of physical therapy at home and they have refused. We also discussed the need to confront the really tough questions in the background here, and accept and discuss them.  NOTE: a total of 45 minutes was spent in direct patient consultation and exam  This document serves as a record of services personally performed by Ancil Linsey, MD. It was created on her behalf by Toni Amend, a trained medical scribe. The creation of this record is based on the scribe's personal observations and the provider's statements to them. This document has been checked and approved by the attending provider.  I have reviewed the above documentation for accuracy and completeness, and I agree with the above.  This note was electronically signed.  Kelby Fam. Whitney Muse, MD

## 2014-10-20 LAB — VITAMIN D 25 HYDROXY (VIT D DEFICIENCY, FRACTURES): VIT D 25 HYDROXY: 65.4 ng/mL (ref 30.0–100.0)

## 2014-10-22 ENCOUNTER — Other Ambulatory Visit (HOSPITAL_COMMUNITY): Payer: Self-pay | Admitting: *Deleted

## 2014-10-22 ENCOUNTER — Encounter (HOSPITAL_COMMUNITY): Payer: Self-pay

## 2014-10-22 DIAGNOSIS — C3492 Malignant neoplasm of unspecified part of left bronchus or lung: Secondary | ICD-10-CM

## 2014-10-22 DIAGNOSIS — E876 Hypokalemia: Secondary | ICD-10-CM

## 2014-10-22 MED ORDER — CHOLECALCIFEROL 125 MCG (5000 UT) PO CAPS: 5000.0000 [IU] | ORAL_CAPSULE | Freq: Every day | ORAL | Status: AC

## 2014-10-22 MED ORDER — POTASSIUM CHLORIDE CRYS ER 20 MEQ PO TBCR: EXTENDED_RELEASE_TABLET | ORAL | Status: AC

## 2014-10-26 ENCOUNTER — Other Ambulatory Visit (HOSPITAL_COMMUNITY): Payer: Self-pay | Admitting: Hematology & Oncology

## 2014-10-26 ENCOUNTER — Other Ambulatory Visit (HOSPITAL_COMMUNITY): Payer: Self-pay | Admitting: Oncology

## 2014-10-26 DIAGNOSIS — F329 Major depressive disorder, single episode, unspecified: Secondary | ICD-10-CM

## 2014-10-26 DIAGNOSIS — F32A Depression, unspecified: Secondary | ICD-10-CM

## 2014-10-26 MED ORDER — BUPROPION HCL ER (XL) 150 MG PO TB24: 300.0000 mg | ORAL_TABLET | Freq: Every day | ORAL | Status: AC

## 2014-10-30 ENCOUNTER — Encounter (INDEPENDENT_AMBULATORY_CARE_PROVIDER_SITE_OTHER): Payer: Self-pay | Admitting: *Deleted

## 2014-11-02 ENCOUNTER — Encounter (HOSPITAL_COMMUNITY): Payer: Medicare Other

## 2014-11-02 ENCOUNTER — Ambulatory Visit (HOSPITAL_COMMUNITY): Payer: Self-pay | Admitting: Oncology

## 2014-11-02 ENCOUNTER — Encounter (HOSPITAL_BASED_OUTPATIENT_CLINIC_OR_DEPARTMENT_OTHER): Payer: Medicare Other | Admitting: Primary Care

## 2014-11-02 ENCOUNTER — Encounter (HOSPITAL_BASED_OUTPATIENT_CLINIC_OR_DEPARTMENT_OTHER): Payer: Medicare Other | Admitting: Oncology

## 2014-11-02 ENCOUNTER — Encounter (HOSPITAL_COMMUNITY): Payer: Self-pay

## 2014-11-02 VITALS — BP 104/62 | HR 57 | Temp 98.1°F | Resp 18

## 2014-11-02 DIAGNOSIS — C3432 Malignant neoplasm of lower lobe, left bronchus or lung: Secondary | ICD-10-CM | POA: Diagnosis not present

## 2014-11-02 DIAGNOSIS — C679 Malignant neoplasm of bladder, unspecified: Secondary | ICD-10-CM

## 2014-11-02 DIAGNOSIS — C3492 Malignant neoplasm of unspecified part of left bronchus or lung: Secondary | ICD-10-CM

## 2014-11-02 DIAGNOSIS — K649 Unspecified hemorrhoids: Secondary | ICD-10-CM

## 2014-11-02 DIAGNOSIS — Z5112 Encounter for antineoplastic immunotherapy: Secondary | ICD-10-CM | POA: Diagnosis not present

## 2014-11-02 DIAGNOSIS — K589 Irritable bowel syndrome without diarrhea: Secondary | ICD-10-CM

## 2014-11-02 DIAGNOSIS — Z515 Encounter for palliative care: Secondary | ICD-10-CM

## 2014-11-02 LAB — CBC WITH DIFFERENTIAL/PLATELET
BASOS ABS: 0 10*3/uL (ref 0.0–0.1)
Basophils Relative: 0 %
EOS PCT: 2 %
Eosinophils Absolute: 0.1 10*3/uL (ref 0.0–0.7)
HCT: 38.1 % — ABNORMAL LOW (ref 39.0–52.0)
HEMOGLOBIN: 13 g/dL (ref 13.0–17.0)
LYMPHS ABS: 1.2 10*3/uL (ref 0.7–4.0)
LYMPHS PCT: 17 %
MCH: 33.3 pg (ref 26.0–34.0)
MCHC: 34.1 g/dL (ref 30.0–36.0)
MCV: 97.7 fL (ref 78.0–100.0)
Monocytes Absolute: 0.8 10*3/uL (ref 0.1–1.0)
Monocytes Relative: 11 %
NEUTROS PCT: 69 %
Neutro Abs: 4.6 10*3/uL (ref 1.7–7.7)
PLATELETS: 194 10*3/uL (ref 150–400)
RBC: 3.9 MIL/uL — AB (ref 4.22–5.81)
RDW: 13 % (ref 11.5–15.5)
WBC: 6.7 10*3/uL (ref 4.0–10.5)

## 2014-11-02 LAB — COMPREHENSIVE METABOLIC PANEL
ALK PHOS: 78 U/L (ref 38–126)
ALT: 7 U/L — AB (ref 17–63)
AST: 16 U/L (ref 15–41)
Albumin: 3.4 g/dL — ABNORMAL LOW (ref 3.5–5.0)
Anion gap: 8 (ref 5–15)
BUN: 11 mg/dL (ref 6–20)
CALCIUM: 9 mg/dL (ref 8.9–10.3)
CHLORIDE: 102 mmol/L (ref 101–111)
CO2: 27 mmol/L (ref 22–32)
CREATININE: 0.94 mg/dL (ref 0.61–1.24)
GFR calc non Af Amer: >60 mL/min (ref >60)
Glucose, Bld: 132 mg/dL — ABNORMAL HIGH (ref 65–99)
Potassium: 3.5 mmol/L (ref 3.5–5.1)
SODIUM: 137 mmol/L (ref 135–145)
Total Bilirubin: 0.4 mg/dL (ref 0.3–1.2)
Total Protein: 6.8 g/dL (ref 6.5–8.1)

## 2014-11-02 MED ORDER — SODIUM CHLORIDE 0.9 % IV SOLN
Freq: Once | INTRAVENOUS | Status: AC
  Administered 2014-11-02: 12:00:00 via INTRAVENOUS

## 2014-11-02 MED ORDER — HEPARIN SOD (PORK) LOCK FLUSH 100 UNIT/ML IV SOLN
500.0000 [IU] | Freq: Once | INTRAVENOUS | Status: AC | PRN
  Administered 2014-11-02: 500 [IU]
  Filled 2014-11-02: qty 5

## 2014-11-02 MED ORDER — OXYCODONE-ACETAMINOPHEN 10-325 MG PO TABS: 1.0000 | ORAL_TABLET | ORAL | Status: DC | PRN

## 2014-11-02 MED ORDER — SODIUM CHLORIDE 0.9 % IJ SOLN: 10.0000 mL | INTRAMUSCULAR | Status: DC | PRN

## 2014-11-02 MED ORDER — HYDROCORTISONE ACETATE 25 MG RE SUPP: 25.0000 mg | Freq: Two times a day (BID) | RECTAL | Status: DC

## 2014-11-02 MED ORDER — SODIUM CHLORIDE 0.9 % IV SOLN
3.0000 mg/kg | Freq: Once | INTRAVENOUS | Status: AC
  Administered 2014-11-02: 170 mg via INTRAVENOUS
  Filled 2014-11-02: qty 10

## 2014-11-02 NOTE — Progress Notes (Signed)
Brian Bogus, MD [place] [address]  Squamous cell carcinoma of left lung - Plan: oxyCODONE-acetaminophen (PERCOCET) 10-325 MG per tablet  Malignant neoplasm of urinary bladder, unspecified site  Hemorrhoids, unspecified hemorrhoid type - Plan: hydrocortisone (ANUSOL-HC) 25 MG suppository  CURRENT THERAPY: Nivolumab beginning on 09/21/2014 for PD-L1 recurrent squamous cell carcinoma of lung  INTERVAL HISTORY: Brian Sims 64 y.o. male returns for followup of recurrent squamous cell carcinoma of lung, S/P XRT by Dr. [name] with progression of disease in July 2016 on CT imaging. AND High-grade urothelial carcinoma with prostate involvement, S/P carboplatin/gemzar 02/14/2014- 05/03/2014 followed by carboplatin weekly + XRT finishing on 05/31/2014.    Squamous cell carcinoma of left lung   10/25/2013 Pathology Results Diagnosis: TRANSBRONCHIAL NEEDLE ASPIRATION NAVIGATION #2, LEFT LOWER LOBE BRUSHING MALIGNANT CELLS PRESENT, CONSISTENT WITH NON-SMALL CELL CARCINOMA.   10/25/2013 Pathology Results Diagnosis: TRANSBRONCHIAL NEEDLE ASPIRATION NAVIGATION #1, LEFT LOWER LOBE. MALIGNANT CELLS PRESENT, CONSISTENT WITH NON-SMALL CELL CARCINOMA. THE FEATURES ARE SUGGESTIVE OF SQUAMOUS CELL CARCINOMA.   10/25/2013 Initial Diagnosis T1N0 SCCA of the left lower lobe    Radiation Therapy    05/17/2014 Imaging CT chest- Persistent but improved appearance of left lower lobe lung mass. Small pulmonary nodules in the right lung are unchanged. No new or progressive disease identified.   08/30/2014 Progression CT chest- Significant enlargement of left lower lobe mass,   09/21/2014 -  Chemotherapy Nivolumab every 2 weeks   10/22/2014 Pathology Results PD-L1 negative    Bladder cancer   01/18/2014 Initial Diagnosis 1. Bladder, biopsy, right lateral wall of bladder - UROTHELIAL CARCINOMA IN SITU, NO STROMAL INVASION IDENTIFIED.  3. Prostate, TUR, deep prostatic tissue -  HIGH GRADE UROTHELIAL CARCINOMA WITH ASSOCIATED TUMOR NECROSIS, INVOLVING PROSTATIC DUCTS    02/14/2014 - 05/03/2014 Chemotherapy Carboplatin/Gemzar   05/09/2014 - 06/28/2014 Radiation Therapy Pelvis and bladder boost by Dr. [name].   05/11/2014 Treatment Plan Change Change chemotherapy to weekly Carboplatin (AUC 2)   05/11/2014 - 05/31/2014 Chemotherapy Weekly Carboplatin with XRT at  AUC 2.   08/30/2014 Remission Ct abd.pelvis- Diffuse thickening of the urinary bladder, without a clearly identifiable bladder wall mass.    I personally reviewed and went over laboratory results with the patient.  The results are noted within this dictation.  Brian Sims reports that he has been taking 1 potassium pill because he has noted that when he takes 2, he urinates more.  This is unrelated to potassium, but since his potassium is WNL today, 1 daily is fine for now.  They note that anusol has been denied by the insurance company.  I am not sure that I know that, so I will prescribe again to his pharmacy and see what happens.  "Prior authorization was completed, but now you guys need to appeal the denial" the patient wife reports.  I am not privy to this information.   He requests a refill on his pain medication.  Past Medical History  Diagnosis Date  . COPD (chronic obstructive pulmonary disease)   . Inflammatory bowel disease   . Constipation   . Lesion of bladder   . Prostate mass   . Dysuria-frequency syndrome     wears depends  . Arthritis   . Shortness of breath   . Depression   . Anxiety   . Kidney stones   . GERD (gastroesophageal reflux disease)   . Cancer 2015    LT lung, bladder and prostate  has IBS (irritable bowel syndrome); Back pain; Heme positive stool; Squamous cell carcinoma of left lung; Bladder cancer; Tobacco abuse; Hematuria; and Neoplasm related pain on his problem list.     is allergic to lyrica; gabapentin; and morphine and related.  Current Outpatient Prescriptions on File  Prior to Visit  Medication Sig Dispense Refill  . ALPRAZolam (XANAX) 1 MG tablet Take 0.5 mg by mouth every 2 (two) hours.     Marland Kitchen buPROPion (WELLBUTRIN XL) 150 MG 24 hr tablet Take 2 tablets (300 mg total) by mouth daily. 60 tablet 2  . Cholecalciferol (VITAMIN D3) 5000 UNITS CAPS Take 1 capsule by mouth daily.    . Cholecalciferol 5000 UNITS capsule Take 1 capsule (5,000 Units total) by mouth daily. 30 capsule 4  . Dextromethorphan Polistirex (DELSYM PO) Take 15 mLs by mouth as needed.    . docusate sodium (COLACE) 100 MG capsule Take 200 mg by mouth at bedtime.    . hydrocortisone (PROCTOZONE-HC) 2.5 % rectal cream Place 1 application rectally 3 (three) times daily as needed for hemorrhoids (swelling).    Marland Kitchen lidocaine-prilocaine (EMLA) cream Apply 1 application topically as needed. 30 g 6  . lubiprostone (AMITIZA) 8 MCG capsule Start with one twice daily by mouth, may increase up to 3 twice daily if needed to help with constipation 60 capsule 1  . magnesium hydroxide (MILK OF MAGNESIA) 400 MG/5ML suspension Take 30 mLs by mouth daily as needed for mild constipation.    . Meth-Hyo-M Bl-Na Phos-Ph Sal (URIBEL) 118 MG CAPS Take 118 mg by mouth 2 (two) times daily.     . mirtazapine (REMERON) 15 MG tablet TAKE 1 TABLET BY MOUTH AT BEDTIME WITH 45MG TABLET FOR A TOTAL DOSE OF 60MG. 30 tablet 1  . mirtazapine (REMERON) 45 MG tablet Take 45 mg by mouth at bedtime.   11  . morphine (MS CONTIN) 15 MG 12 hr tablet Take 1 tablet (15 mg total) by mouth every 12 (twelve) hours. (Patient not taking: Reported on 11/02/2014) 60 tablet 0  . Multiple Vitamins-Minerals (MULTIVITAMIN WITH MINERALS) tablet Take 1 tablet by mouth every morning. [employer] Multi Vitamin    . Nivolumab (OPDIVO IV) Inject into the vein every 14 (fourteen) days.    . NON FORMULARY Take 250 mg by mouth daily. Grape concentrate -used as antioxident    . omeprazole (PRILOSEC) 20 MG capsule Take 1 capsule (20 mg total) by mouth 2 (two) times  daily before a meal. 60 capsule 3  . ondansetron (ZOFRAN) 8 MG tablet Take 1 tablet (8 mg total) by mouth every 8 (eight) hours as needed for nausea or vomiting. 30 tablet 3  . potassium chloride SA (K-DUR,KLOR-CON) 20 MEQ tablet Take 1 tablet twice a day x 15 days. 30 tablet 1  . predniSONE (DELTASONE) 10 MG tablet Take 50 mg PO daily for side effects of Opdivo as reviewed in chemotherapy teaching. 120 tablet 0  . prochlorperazine (COMPAZINE) 10 MG tablet Take 1 tablet (10 mg total) by mouth every 6 (six) hours as needed for nausea or vomiting. 30 tablet 1  . Propylene Glycol (SYSTANE BALANCE OP) Place 1 drop into both eyes daily.    Marland Kitchen senna (SENOKOT) 8.6 MG TABS tablet Take 1 tablet by mouth 3 (three) times daily as needed for mild constipation.    . tamsulosin (FLOMAX) 0.4 MG CAPS capsule Take 1 capsule (0.4 mg total) by mouth daily. 30 capsule 3  . tiotropium (SPIRIVA) 18 MCG inhalation capsule Place  18 mcg into inhaler and inhale every evening.     . traZODone (DESYREL) 150 MG tablet Take 2 1/2 to three tablets at night for sleep and depression 90 tablet 3  . vitamin C (ASCORBIC ACID) 500 MG tablet Take 500 mg by mouth daily.     No current facility-administered medications on file prior to visit.    Past Surgical History  Procedure Laterality Date  . Back surgery    . Left shoulder      arthroscopy  . Rt knee arthroscopy    . Colonoscopy N/A 01/11/2013    Procedure: COLONOSCOPY;  Surgeon: [name], MD;  Location: [facility];  Service: Endoscopy;  Laterality: N/A;  340  . Carpal tunnel release Right   . Transurethral resection of bladder tumor N/A 09/21/2013    Procedure: TRANSURETHRAL RESECTION  PROSTATE  AND BLADDER ;  Surgeon: [name], MD;  Location: [facility];  Service: Urology;  Laterality: N/A;  . Video bronchoscopy with endobronchial navigation Right 10/25/2013    Procedure: VIDEO BRONCHOSCOPY WITH ENDOBRONCHIAL NAVIGATION;  Surgeon: [name], MD;  Location: [place];  Service: Thoracic;  Laterality: Right;  . Transurethral resection of bladder tumor with gyrus (turbt-gyrus) N/A 01/18/2014    Procedure: TRANSURETHRAL RESECTION OF BLADDER TUMOR WITH GYRUS (TURBT-GYRUS);  Surgeon: [name], MD;  Location: [facility];  Service: Urology;  Laterality: N/A;  . Transurethral resection of prostate N/A 01/18/2014    Procedure: TRANSURETHRAL RESECTION OF THE PROSTATE (TURP);  Surgeon: [name], MD;  Location: [facility];  Service: Urology;  Laterality: N/A;    Denies any headaches, dizziness, double vision, fevers, chills, night sweats, nausea, vomiting, diarrhea, constipation, chest pain, heart palpitations, shortness of breath, blood in stool, black tarry stool, urinary pain, urinary burning, urinary frequency, hematuria.   PHYSICAL EXAMINATION  ECOG PERFORMANCE STATUS: 3 - Symptomatic, >50% confined to bed  There were no vitals filed for this visit.  GENERAL:alert, no distress and cooperative, chronically ill appearing, accompanied by son and wife, in chemo-bed. SKIN: skin color, texture, turgor are normal, positive for: left lateral buttocks pressure wound, healing, stage 2 HEAD: Normocephalic, No masses, lesions, tenderness or abnormalities EYES: normal, PERRLA, EOMI, Conjunctiva are pink and non-injected EARS: External ears normal OROPHARYNX:lips, buccal mucosa, and tongue normal and mucous membranes are moist  NECK: supple, trachea midline LYMPH:  not examined BREAST:not examined LUNGS: not examined HEART: not examined ABDOMEN:abdomen soft BACK: Back symmetric, no curvature. EXTREMITIES:less then 2 second capillary refill, no joint deformities, effusion, or inflammation, no skin discoloration, no cyanosis  NEURO: alert & oriented x 3 with fluent speech, no focal motor/sensory deficits   LABORATORY DATA: CBC    Component Value Date/Time   WBC 6.7 11/02/2014 1140   RBC 3.90* 11/02/2014 1140   HGB 13.0 11/02/2014  1140   HCT 38.1* 11/02/2014 1140   PLT 194 11/02/2014 1140   MCV 97.7 11/02/2014 1140   MCH 33.3 11/02/2014 1140   MCHC 34.1 11/02/2014 1140   RDW 13.0 11/02/2014 1140   LYMPHSABS 1.2 11/02/2014 1140   MONOABS 0.8 11/02/2014 1140   EOSABS 0.1 11/02/2014 1140   BASOSABS 0.0 11/02/2014 1140      Chemistry      Component Value Date/Time   NA 137 11/02/2014 1140   K 3.5 11/02/2014 1140   CL 102 11/02/2014 1140   CO2 27 11/02/2014 1140   BUN 11 11/02/2014 1140   CREATININE 0.94 11/02/2014 1140  Component Value Date/Time   CALCIUM 9.0 11/02/2014 1140   ALKPHOS 78 11/02/2014 1140   AST 16 11/02/2014 1140   ALT 7* 11/02/2014 1140   BILITOT 0.4 11/02/2014 1140        PENDING LABS:   RADIOGRAPHIC STUDIES:  No results found.   PATHOLOGY:    ASSESSMENT AND PLAN:  Squamous cell carcinoma of left lung Recurrent squamous cell carcinoma of lung, S/P XRT by Dr. [name] with progression of disease in July 2016 on CT imaging.  Now with progressive disease on CT imaging in July 2016.  PD-L1 testing is negative.  Started on Nivolumab on 09/21/2014.  Oncology history updated.  Patient was seen by palliative medicine on his last encounter and again today.  They questioned life expectancy and I quoted 3- 6 months of life given current situation.  His Karnofsky performance score is about 50% or worse, and his ECOG score is 3.  As a result, without cancer, I would estimate 3-6 months based upon these scores and his failure to thrive.  With cancer, I think 3-6 months is reasonable.  Discussed code status.  "I feel like I am not going to die."  I educated the patient that no one will live for ever.  We discussed CPR, intubation, life support.  The family had a healthy discussion regarding this.  The discussion will continue at home.  "I've already told Dr. Luan Pulling my wishes in private" Brian Sims says.  I explained that that private conversation does not help me, nor his family now if  there was an emergency.  "Well I'd like to make my own decision while I can and if a situation arises and I am not able, then my wife can."  I explained to him the negative regarding that comment, including being unfair to his wife to make difficult decision for him that are life and death related, and the possible wrong-doings that could occur unbeknownst to him, etc.  I explained code status information to him and each part of a code.  He would benefit from Palliative reviewing the MOST document potentially.  "Do you think he will get better with healthier eating" the son asks.  The family and patient are welcomed to try, but it is unlikely to change his prognosis and outcome.  The patient's wife is witnessed pouring a soda into a cup and her hand shakiness was appreciated.  She was visibly upset at times, particularly during death and dying discussions.  Hospice was broached.  They are not interested at this time.  Lab are back and good for treatment.  Pre-chemo labs as ordered.  Return with next treatment.   Bladder cancer High-grade urothelial carcinoma with prostate involvement, S/P carboplatin/gemzar 02/14/2014- 05/03/2014 followed by carboplatin weekly + XRT finishing on 05/31/2014.    THERAPY PLAN:  Continue with treatment as planned.  Palliative care is following.  Discussion regarding code status and advanced directives took place.  They will discuss this information moving forward.  All questions were answered. The patient knows to call the clinic with any problems, questions or concerns. We can certainly see the patient much sooner if necessary.  45+ minutes was spent in face-to-face discussion with the patient and 60 minutes in total was spent on this encounter.    More than 50% of the time spent with the patient was utilized for counseling and coordination of care.   Patient and plan discussed with Dr. Ancil Linsey and she is in agreement with the aforementioned.  This  note is electronically signed by: Doy Mince 11/02/2014 4:44 PM

## 2014-11-02 NOTE — Assessment & Plan Note (Addendum)
Recurrent squamous cell carcinoma of lung, S/P XRT by Dr. [name] with progression of disease in July 2016 on CT imaging.  Now with progressive disease on CT imaging in July 2016.  PD-L1 testing is negative.  Started on Nivolumab on 09/21/2014.  Oncology history updated.  Patient was seen by palliative medicine on his last encounter and again today.  They questioned life expectancy and I quoted 3- 6 months of life given current situation.  His Karnofsky performance score is about 50% or worse, and his ECOG score is 3.  As a result, without cancer, I would estimate 3-6 months based upon these scores and his failure to thrive.  With cancer, I think 3-6 months is reasonable.  Discussed code status.  "I feel like I am not going to die."  I educated the patient that no one will live for ever.  We discussed CPR, intubation, life support.  The family had a healthy discussion regarding this.  The discussion will continue at home.  "I've already told Dr. Luan Pulling my wishes in private" Jossiah says.  I explained that that private conversation does not help me, nor his family now if there was an emergency.  "Well I'd like to make my own decision while I can and if a situation arises and I am not able, then my wife can."  I explained to him the negative regarding that comment, including being unfair to his wife to make difficult decision for him that are life and death related, and the possible wrong-doings that could occur unbeknownst to him, etc.  I explained code status information to him and each part of a code.  He would benefit from Palliative reviewing the MOST document potentially.  "Do you think he will get better with healthier eating" the son asks.  The family and patient are welcomed to try, but it is unlikely to change his prognosis and outcome.  The patient's wife is witnessed pouring a soda into a cup and her hand shakiness was appreciated.  She was visibly upset at times, particularly during death and  dying discussions.  Hospice was broached.  They are not interested at this time.  Lab are back and good for treatment.  Pre-chemo labs as ordered.  Return with next treatment.

## 2014-11-02 NOTE — Progress Notes (Signed)
Patient ID: EBON KETCHUM, male   DOB: October 28, 1950, 64 y.o.   MRN: [id number]  Consultation Note Date: 11/02/2014   Patient Name: Brian Sims  DOB: 1950/04/23  MRN: [id number]  Age / Sex: 64 y.o., male   PCP: Sinda Du, MD Referring Physician: Drue Novel, NP  Reason for Consultation: Establishing goals of care and Psychosocial/spiritual support  Palliative Care Assessment and Plan Summary of Established Goals of Care and Medical Treatment Preferences   Clinical Assessment/Narrative: I meet with Brian Sims, his wife, and son in his room today while he is receiving chemotherapy.  Brian Sims talks about the burden of cancer treatments, financial, physical and emotional.  We talk about Brian Sims symptoms and discuss options for managing his IBS.  Brian Sims talks about thier diet plan and how they had changed to a holistic vitamin rich diet years ago with no help to his symptoms.  After a period of silence, Brian Sims states "We're sad".  She talks about thier 40 years of marriage.  I bring up their feelings of hurt and betrayal by the medical community.  Brian Sims states that she "won't get over it", but then states, "they are human too".  We talk about goals and Brian Sims talks about increased mobility and desire to do things.  Brian Sims states he feels like if he does much on one day and then is unable to continue being active he will have "let them down".   We continue to talk about the long period of time the family spent seeking answers for his pain and illness.   Brian Sims talks about Brian Sims sister being diagnosed with liver cancer and dying 1 year later, saying, "we've had this time".  We talk about what gives Brian Sims a feeling of worth, and Brian Sims interjects that his being there for she and their son provides this.  I share that Brian Sims needs to have a feeling that how he lives contributes in some way.  We talk about calls or cards/letters with a church or to  the soldiers.  Brian Sims goal today is " to live day to day" and manage his bowels.  They agree to meet with me in 2 weeks.   Contacts/Participants in Discussion: Primary Decision Maker: Brian Sims is able to make his own decisions at this time.     Code Status/Advance Care Planning:  Full code   Symptom Management:   Morphine ER 15 mg PO BID; Percocet 10-325   Palliative Prophylaxis: IBS with diarrhea/constipation  Use of Amitiza,  Linzess, Senna, colace, MOM, enema for constipation   Use of barrier cream with 40% zinc for periods of diarrhea after chemotherapy.   Will research "disimpactor" tool.   Psycho-social/Spiritual:   Support System: Brian Sims lives with his wife of 22 years.  Their adult son also is active in providing care.   Desire for further Chaplaincy support:  Not discussed today.        Chief Complaint/History of Present Illness: Brian Sims is seen today for Ririe discussion and support during his cancer treatments.   Primary Diagnoses  1. '@PROBSPOA'$ @  Palliative Review of Systems: Brian Sims denies pain, dyspnea, NV or anxiety at this time. Continued complaints with IBS which have been ongoing for many years.  I have reviewed the medical record, interviewed the patient and family, and examined the patient. The following aspects are pertinent.  Past Medical History  Diagnosis Date  . COPD (chronic  obstructive pulmonary disease)   . Inflammatory bowel disease   . Constipation   . Lesion of bladder   . Prostate mass   . Dysuria-frequency syndrome     wears depends  . Arthritis   . Shortness of breath   . Depression   . Anxiety   . Kidney stones   . GERD (gastroesophageal reflux disease)   . Cancer 2015    LT lung, bladder and prostate   Social History   Social History  . Marital Status: Married    Spouse Name: N/A  . Number of Children: N/A  . Years of Education: N/A   Social History Main Topics  . Smoking status: Current Every Day  Smoker -- 1.00 packs/day for 40 years    Types: Cigarettes  . Smokeless tobacco: Never Used     Comment: 1 pack a day since age 4  . Alcohol Use: No  . Drug Use: No  . Sexual Activity: Not on file   Other Topics Concern  . Not on file   Social History Narrative   Family History  Problem Relation Age of Onset  . Diabetes Sister   . Cancer Brother    Scheduled Meds: Continuous Infusions: PRN Meds:. Medications Prior to Admission:  Prior to Admission medications   Medication Sig Start Date End Date Taking? Authorizing Provider  ALPRAZolam Duanne Moron) 1 MG tablet Take 0.5 mg by mouth every 2 (two) hours.     Historical Provider, MD  buPROPion (WELLBUTRIN XL) 150 MG 24 hr tablet Take 2 tablets (300 mg total) by mouth daily. 10/26/14   Baird Cancer, PA-C  Cholecalciferol (VITAMIN D3) 5000 UNITS CAPS Take 1 capsule by mouth daily.    Historical Provider, MD  Cholecalciferol 5000 UNITS capsule Take 1 capsule (5,000 Units total) by mouth daily. 10/22/14   Baird Cancer, PA-C  Dextromethorphan Polistirex (DELSYM PO) Take 15 mLs by mouth as needed.    Historical Provider, MD  docusate sodium (COLACE) 100 MG capsule Take 200 mg by mouth at bedtime.    Historical Provider, MD  hydrocortisone (ANUSOL-HC) 25 MG suppository Place 1 suppository (25 mg total) rectally 2 (two) times daily. Patient not taking: Reported on 11/02/2014 10/19/14   Patrici Ranks, MD  hydrocortisone (PROCTOZONE-HC) 2.5 % rectal cream Place 1 application rectally 3 (three) times daily as needed for hemorrhoids (swelling).    Historical Provider, MD  lidocaine-prilocaine (EMLA) cream Apply 1 application topically as needed. 03/07/14   Baird Cancer, PA-C  lubiprostone (AMITIZA) 8 MCG capsule Start with one twice daily by mouth, may increase up to 3 twice daily if needed to help with constipation 10/19/14   Patrici Ranks, MD  magnesium hydroxide (MILK OF MAGNESIA) 400 MG/5ML suspension Take 30 mLs by mouth daily as  needed for mild constipation.    Historical Provider, MD  Meth-Hyo-M Bl-Na Phos-Ph Sal (URIBEL) 118 MG CAPS Take 118 mg by mouth 2 (two) times daily.     Historical Provider, MD  mirtazapine (REMERON) 15 MG tablet TAKE 1 TABLET BY MOUTH AT BEDTIME WITH '45MG'$  TABLET FOR A TOTAL DOSE OF '60MG'$ . 09/03/14   Patrici Ranks, MD  mirtazapine (REMERON) 45 MG tablet Take 45 mg by mouth at bedtime.  02/03/14   Historical Provider, MD  morphine (MS CONTIN) 15 MG 12 hr tablet Take 1 tablet (15 mg total) by mouth every 12 (twelve) hours. Patient not taking: Reported on 11/02/2014 02/12/14   Patrici Ranks, MD  Multiple  Vitamins-Minerals (MULTIVITAMIN WITH MINERALS) tablet Take 1 tablet by mouth every morning. [employer] Multi Vitamin    Historical Provider, MD  Nivolumab (OPDIVO IV) Inject into the vein every 14 (fourteen) days.    Historical Provider, MD  NON FORMULARY Take 250 mg by mouth daily. Grape concentrate -used as antioxident    Historical Provider, MD  omeprazole (PRILOSEC) 20 MG capsule Take 1 capsule (20 mg total) by mouth 2 (two) times daily before a meal. 09/17/14   Patrici Ranks, MD  ondansetron (ZOFRAN) 8 MG tablet Take 1 tablet (8 mg total) by mouth every 8 (eight) hours as needed for nausea or vomiting. 05/03/14   Patrici Ranks, MD  oxyCODONE-acetaminophen (PERCOCET) 10-325 MG per tablet Take 1-2 tablets by mouth every 4 (four) hours as needed for pain. 11/02/14   Baird Cancer, PA-C  potassium chloride SA (K-DUR,KLOR-CON) 20 MEQ tablet Take 1 tablet twice a day x 15 days. 10/22/14   Baird Cancer, PA-C  predniSONE (DELTASONE) 10 MG tablet Take 50 mg PO daily for side effects of Opdivo as reviewed in chemotherapy teaching. 09/18/14   Baird Cancer, PA-C  prochlorperazine (COMPAZINE) 10 MG tablet Take 1 tablet (10 mg total) by mouth every 6 (six) hours as needed for nausea or vomiting. 02/11/14   Patrici Ranks, MD  Propylene Glycol (SYSTANE BALANCE OP) Place 1 drop into both eyes  daily.    Historical Provider, MD  senna (SENOKOT) 8.6 MG TABS tablet Take 1 tablet by mouth 3 (three) times daily as needed for mild constipation.    Historical Provider, MD  tamsulosin (FLOMAX) 0.4 MG CAPS capsule Take 1 capsule (0.4 mg total) by mouth daily. 09/17/14   Patrici Ranks, MD  tiotropium (SPIRIVA) 18 MCG inhalation capsule Place 18 mcg into inhaler and inhale every evening.     Historical Provider, MD  traZODone (DESYREL) 150 MG tablet Take 2 1/2 to three tablets at night for sleep and depression 09/03/14   Patrici Ranks, MD  vitamin C (ASCORBIC ACID) 500 MG tablet Take 500 mg by mouth daily.    Historical Provider, MD   Allergies  Allergen Reactions  . Lyrica [Pregabalin] Nausea And Vomiting and Other (See Comments)    Palpitations, dizziness  . Gabapentin Nausea And Vomiting, Palpitations and Other (See Comments)    Dizziness, sweating,   . Morphine And Related Palpitations and Other (See Comments)    Lightheaded, dizziness, nausea   CBC:    Component Value Date/Time   WBC 6.7 11/02/2014 1140   HGB 13.0 11/02/2014 1140   HCT 38.1* 11/02/2014 1140   PLT 194 11/02/2014 1140   MCV 97.7 11/02/2014 1140   NEUTROABS 4.6 11/02/2014 1140   LYMPHSABS 1.2 11/02/2014 1140   MONOABS 0.8 11/02/2014 1140   EOSABS 0.1 11/02/2014 1140   BASOSABS 0.0 11/02/2014 1140   Comprehensive Metabolic Panel:    Component Value Date/Time   NA 137 11/02/2014 1140   K 3.5 11/02/2014 1140   CL 102 11/02/2014 1140   CO2 27 11/02/2014 1140   BUN 11 11/02/2014 1140   CREATININE 0.94 11/02/2014 1140   GLUCOSE 132* 11/02/2014 1140   CALCIUM 9.0 11/02/2014 1140   AST 16 11/02/2014 1140   ALT 7* 11/02/2014 1140   ALKPHOS 78 11/02/2014 1140   BILITOT 0.4 11/02/2014 1140   PROT 6.8 11/02/2014 1140   ALBUMIN 3.4* 11/02/2014 1140    Physical Exam: Vital Signs: There were no vitals taken for this  visit. SpO2:   O2 Device:   O2 Flow Rate:   Intake/output summary: '@IOBRIEF'$ @  Exam  Findings:  Constitutional:  Appears older than stated age, frail, thin Resp:  Sims and non labored.  GI: Abd soft flat Psych: flat affect.  Makes but does not keep eye contact.          Palliative Performance Scale: 50-60%           Additional Data Reviewed: Recent Labs     11/02/14  1140  WBC  6.7  HGB  13.0  PLT  194  NA  137  BUN  11  CREATININE  0.94     Time In: 1:45 pm Time Out:  2:45 pm Time Total:  60 minutes Greater than 50%  of this time was spent counseling and coordinating care related to the above assessment and plan.  Signed by: Drue Novel, NP  Drue Novel, NP  11/02/2014, 3:53 PM  Please contact Palliative Medicine Team phone at [phone] for questions and concerns.

## 2014-11-02 NOTE — Progress Notes (Signed)
Patient tolerated infusion well.  VSS.   

## 2014-11-02 NOTE — Patient Instructions (Signed)
..  [facility] Discharge Instructions for Patients Receiving Chemotherapy  Today you received the following chemotherapy agent: Opdivo  To help prevent nausea and vomiting after your treatment, we encourage you to take your nausea medication Zofran '8mg'$  every 8 hours as needed for nausea or vomiting Begin taking it as needed and take it as often as prescribed for the next 48 hours.   If you develop nausea and vomiting, or diarrhea that is not controlled by your medication, call the clinic.  The clinic phone number is [phone]. Office hours are Monday-Friday 8:30am-5:00pm.  BELOW ARE SYMPTOMS THAT SHOULD BE REPORTED IMMEDIATELY:  *FEVER GREATER THAN 101.0 F  *CHILLS WITH OR WITHOUT FEVER  NAUSEA AND VOMITING THAT IS NOT CONTROLLED WITH YOUR NAUSEA MEDICATION  *UNUSUAL SHORTNESS OF BREATH  *UNUSUAL BRUISING OR BLEEDING  TENDERNESS IN MOUTH AND THROAT WITH OR WITHOUT PRESENCE OF ULCERS  *URINARY PROBLEMS  *BOWEL PROBLEMS  UNUSUAL RASH Items with * indicate a potential emergency and should be followed up as soon as possible. If you have an emergency after office hours please contact your primary care physician or go to the nearest emergency department.  Please call the clinic during office hours if you have any questions or concerns.   You may also contact the Patient Navigator at [phone] should you have any questions or need assistance in obtaining follow up care. _____________________________________________________________________ Have you asked about our STAR program?    STAR stands for Survivorship Training and Rehabilitation, and this is a nationally recognized cancer care program that focuses on survivorship and rehabilitation.  Cancer and cancer treatments may cause problems, such as, pain, making you feel tired and keeping you from doing the things that you need or want to do. Cancer rehabilitation can help. Our goal is to reduce these troubling  effects and help you have the best quality of life possible.  You may receive a survey from a nurse that asks questions about your current state of health.  Based on the survey results, all eligible patients will be referred to the [facility] program for an evaluation so we can better serve you! A frequently asked questions sheet is available upon request.

## 2014-11-02 NOTE — Assessment & Plan Note (Signed)
High-grade urothelial carcinoma with prostate involvement, S/P carboplatin/gemzar 02/14/2014- 05/03/2014 followed by carboplatin weekly + XRT finishing on 05/31/2014.

## 2014-11-03 ENCOUNTER — Other Ambulatory Visit (HOSPITAL_COMMUNITY): Payer: Self-pay | Admitting: Hematology & Oncology

## 2014-11-12 ENCOUNTER — Inpatient Hospital Stay (HOSPITAL_COMMUNITY): Payer: Self-pay

## 2014-11-12 ENCOUNTER — Ambulatory Visit (HOSPITAL_COMMUNITY): Payer: Self-pay | Admitting: Hematology & Oncology

## 2014-11-12 ENCOUNTER — Encounter (HOSPITAL_COMMUNITY): Payer: Self-pay | Admitting: Primary Care

## 2014-11-14 ENCOUNTER — Ambulatory Visit (INDEPENDENT_AMBULATORY_CARE_PROVIDER_SITE_OTHER): Payer: Self-pay | Admitting: Internal Medicine

## 2014-11-16 ENCOUNTER — Encounter (HOSPITAL_BASED_OUTPATIENT_CLINIC_OR_DEPARTMENT_OTHER): Payer: Medicare Other | Admitting: Oncology

## 2014-11-16 ENCOUNTER — Encounter (HOSPITAL_COMMUNITY): Payer: Medicare Other | Attending: Hematology & Oncology

## 2014-11-16 ENCOUNTER — Encounter (HOSPITAL_COMMUNITY): Payer: Self-pay | Admitting: Primary Care

## 2014-11-16 VITALS — BP 115/70 | HR 56 | Temp 98.0°F | Resp 16

## 2014-11-16 VITALS — BP 114/65 | HR 65 | Temp 98.1°F | Resp 16 | Wt 121.2 lb

## 2014-11-16 DIAGNOSIS — C3492 Malignant neoplasm of unspecified part of left bronchus or lung: Secondary | ICD-10-CM

## 2014-11-16 DIAGNOSIS — C679 Malignant neoplasm of bladder, unspecified: Secondary | ICD-10-CM | POA: Diagnosis not present

## 2014-11-16 DIAGNOSIS — C3432 Malignant neoplasm of lower lobe, left bronchus or lung: Secondary | ICD-10-CM

## 2014-11-16 DIAGNOSIS — Z5112 Encounter for antineoplastic immunotherapy: Secondary | ICD-10-CM | POA: Diagnosis not present

## 2014-11-16 DIAGNOSIS — C7982 Secondary malignant neoplasm of genital organs: Secondary | ICD-10-CM

## 2014-11-16 DIAGNOSIS — L89309 Pressure ulcer of unspecified buttock, unspecified stage: Secondary | ICD-10-CM | POA: Diagnosis not present

## 2014-11-16 LAB — COMPREHENSIVE METABOLIC PANEL
ALBUMIN: 3.2 g/dL — AB (ref 3.5–5.0)
ALK PHOS: 66 U/L (ref 38–126)
ALT: 8 U/L — AB (ref 17–63)
AST: 16 U/L (ref 15–41)
Anion gap: 5 (ref 5–15)
BILIRUBIN TOTAL: 0.3 mg/dL (ref 0.3–1.2)
BUN: 10 mg/dL (ref 6–20)
CALCIUM: 8.7 mg/dL — AB (ref 8.9–10.3)
CO2: 30 mmol/L (ref 22–32)
Chloride: 104 mmol/L (ref 101–111)
Creatinine, Ser: 0.87 mg/dL (ref 0.61–1.24)
GFR calc Af Amer: >60 mL/min (ref >60)
GFR calc non Af Amer: >60 mL/min (ref >60)
GLUCOSE: 103 mg/dL — AB (ref 65–99)
Potassium: 3.5 mmol/L (ref 3.5–5.1)
Sodium: 139 mmol/L (ref 135–145)
TOTAL PROTEIN: 6.5 g/dL (ref 6.5–8.1)

## 2014-11-16 LAB — CBC WITH DIFFERENTIAL/PLATELET
BASOS ABS: 0 10*3/uL (ref 0.0–0.1)
BASOS PCT: 0 %
Eosinophils Absolute: 0.1 10*3/uL (ref 0.0–0.7)
Eosinophils Relative: 2 %
HEMATOCRIT: 36.3 % — AB (ref 39.0–52.0)
HEMOGLOBIN: 12.2 g/dL — AB (ref 13.0–17.0)
Lymphocytes Relative: 18 %
Lymphs Abs: 1.2 10*3/uL (ref 0.7–4.0)
MCH: 32.6 pg (ref 26.0–34.0)
MCHC: 33.6 g/dL (ref 30.0–36.0)
MCV: 97.1 fL (ref 78.0–100.0)
Monocytes Absolute: 0.6 10*3/uL (ref 0.1–1.0)
Monocytes Relative: 9 %
NEUTROS ABS: 4.7 10*3/uL (ref 1.7–7.7)
NEUTROS PCT: 71 %
Platelets: 198 10*3/uL (ref 150–400)
RBC: 3.74 MIL/uL — ABNORMAL LOW (ref 4.22–5.81)
RDW: 14 % (ref 11.5–15.5)
WBC: 6.7 10*3/uL (ref 4.0–10.5)

## 2014-11-16 LAB — TSH: TSH: 13.266 u[IU]/mL — ABNORMAL HIGH (ref 0.350–4.500)

## 2014-11-16 MED ORDER — SODIUM CHLORIDE 0.9 % IV SOLN
3.0000 mg/kg | Freq: Once | INTRAVENOUS | Status: AC
  Administered 2014-11-16: 170 mg via INTRAVENOUS
  Filled 2014-11-16: qty 10

## 2014-11-16 MED ORDER — SODIUM CHLORIDE 0.9 % IV SOLN
Freq: Once | INTRAVENOUS | Status: AC
  Administered 2014-11-16: 13:00:00 via INTRAVENOUS

## 2014-11-16 MED ORDER — SODIUM CHLORIDE 0.9 % IJ SOLN
10.0000 mL | INTRAMUSCULAR | Status: DC | PRN
  Administered 2014-11-16: 10 mL
  Filled 2014-11-16: qty 10

## 2014-11-16 MED ORDER — HEPARIN SOD (PORK) LOCK FLUSH 100 UNIT/ML IV SOLN
500.0000 [IU] | Freq: Once | INTRAVENOUS | Status: AC | PRN
  Administered 2014-11-16: 500 [IU]

## 2014-11-16 NOTE — Progress Notes (Signed)
..  Brian Sims arrived today for office visit and opdivo. Tolerated well.

## 2014-11-16 NOTE — Assessment & Plan Note (Addendum)
High-grade urothelial carcinoma with prostate involvement, S/P carboplatin/gemzar 02/14/2014- 05/03/2014 followed by carboplatin weekly + XRT finishing on 05/31/2014.  Now on Nivolumab.  Patient complains of increased pain with BM.  Will perform an MRI of pelvis in the near future to evaluate for malignancy being the cause of this.  If so, we will need to more aggressively (although we have been aggressively all along) discuss end of life issues.  Given his performance status and prognosis, colonoscopy is not likely to be beneficial.

## 2014-11-16 NOTE — Assessment & Plan Note (Signed)
Recurrent squamous cell carcinoma of lung, S/P XRT by Dr. [name] with progression of disease in July 2016 on CT imaging.  Now with progressive disease on CT imaging in July 2016.  PD-L1 testing is negative.  Started on Nivolumab on 09/21/2014.  Oncology history updated.  Patient was seen by palliative medicine on his last encounter.  Lab are back and good for treatment.  Pre-chemo labs as ordered.  We will need to restage his chest in the near future.  CT imaging will be ordered on next follow-up visit.  Return with next treatment.

## 2014-11-16 NOTE — Progress Notes (Signed)
[name], MD [place] [id number]  Malignant neoplasm of urinary bladder, unspecified site [facility]) - Plan: MR Pelvis W Contrast  Squamous cell carcinoma of left lung ([place])  CURRENT THERAPY: Nivolumab beginning on 09/21/2014 for PD-L1 recurrent squamous cell carcinoma of lung  INTERVAL HISTORY: ADEMOLA VERT 64 y.o. male returns for followup of recurrent squamous cell carcinoma of lung, S/P XRT by Dr. [name] with progression of disease in July 2016 on CT imaging. AND High-grade urothelial carcinoma with prostate involvement, S/P carboplatin/gemzar 02/14/2014- 05/03/2014 followed by carboplatin weekly + XRT finishing on 05/31/2014.    Squamous cell carcinoma of left lung ([place])   10/25/2013 Pathology Results Diagnosis: TRANSBRONCHIAL NEEDLE ASPIRATION NAVIGATION #2, LEFT LOWER LOBE BRUSHING MALIGNANT CELLS PRESENT, CONSISTENT WITH NON-SMALL CELL CARCINOMA.   10/25/2013 Pathology Results Diagnosis: TRANSBRONCHIAL NEEDLE ASPIRATION NAVIGATION #1, LEFT LOWER LOBE. MALIGNANT CELLS PRESENT, CONSISTENT WITH NON-SMALL CELL CARCINOMA. THE FEATURES ARE SUGGESTIVE OF SQUAMOUS CELL CARCINOMA.   10/25/2013 Initial Diagnosis T1N0 SCCA of the left lower lobe    Radiation Therapy    05/17/2014 Imaging CT chest- Persistent but improved appearance of left lower lobe lung mass. Small pulmonary nodules in the right lung are unchanged. No new or progressive disease identified.   08/30/2014 Progression CT chest- Significant enlargement of left lower lobe mass,   09/21/2014 -  Chemotherapy Nivolumab every 2 weeks   10/22/2014 Pathology Results PD-L1 negative    Bladder cancer ([place])   01/18/2014 Initial Diagnosis 1. Bladder, biopsy, right lateral wall of bladder - UROTHELIAL CARCINOMA IN SITU, NO STROMAL INVASION IDENTIFIED.  3. Prostate, TUR, deep prostatic tissue - HIGH GRADE UROTHELIAL CARCINOMA WITH ASSOCIATED TUMOR NECROSIS, INVOLVING PROSTATIC DUCTS    02/14/2014 -  05/03/2014 Chemotherapy Carboplatin/Gemzar   05/09/2014 - 06/28/2014 Radiation Therapy Pelvis and bladder boost by Dr. [name].   05/11/2014 Treatment Plan Change Change chemotherapy to weekly Carboplatin (AUC 2)   05/11/2014 - 05/31/2014 Chemotherapy Weekly Carboplatin with XRT at  AUC 2.   08/30/2014 Remission Ct abd.pelvis- Diffuse thickening of the urinary bladder, without a clearly identifiable bladder wall mass.    I personally reviewed and went over laboratory results with the patient.  The results are noted within this dictation.  Anusol was approved with insurance, but cost was high.  Pharmacist filled hydrocortisone rectal cream instead of Anusol due to cost.  Nursing confirmed.  D[place] is fearful of seeing Dr. Laural Golden because he does not want to have a colonoscopy which he is due for.  I think it is reasonable to not perform a colonoscopy given the patient's performance status and prognosis.  He complains of increasing pain with BMs.  We will evaluate with MRI pelvis given his malignancy.  They have not discussed goals of care and end of life issues.  Past Medical History  Diagnosis Date  . COPD (chronic obstructive pulmonary disease)   . Inflammatory bowel disease   . Constipation   . Lesion of bladder   . Prostate mass   . Dysuria-frequency syndrome     wears depends  . Arthritis   . Shortness of breath   . Depression   . Anxiety   . Kidney stones   . GERD (gastroesophageal reflux disease)   . Cancer 2015    LT lung, bladder and prostate    has IBS (irritable bowel syndrome); Back pain; Heme positive stool; Squamous cell carcinoma of left lung ([place]); Bladder cancer ([place]); Tobacco abuse; Hematuria;  and Neoplasm related pain on his problem list.     is allergic to lyrica; gabapentin; and morphine and related.  Current Outpatient Prescriptions on File Prior to Visit  Medication Sig Dispense Refill  . ALPRAZolam (XANAX) 1 MG tablet Take 0.5 mg by mouth every 2 (two) hours.      Marland Kitchen buPROPion (WELLBUTRIN XL) 150 MG 24 hr tablet Take 2 tablets (300 mg total) by mouth daily. 60 tablet 2  . Cholecalciferol (VITAMIN D3) 5000 UNITS CAPS Take 1 capsule by mouth daily.    . Cholecalciferol 5000 UNITS capsule Take 1 capsule (5,000 Units total) by mouth daily. 30 capsule 4  . Dextromethorphan Polistirex (DELSYM PO) Take 15 mLs by mouth as needed.    . docusate sodium (COLACE) 100 MG capsule Take 200 mg by mouth at bedtime.    . hydrocortisone (ANUSOL-HC) 25 MG suppository Place 1 suppository (25 mg total) rectally 2 (two) times daily. 12 suppository 0  . hydrocortisone (PROCTOZONE-HC) 2.5 % rectal cream Place 1 application rectally 3 (three) times daily as needed for hemorrhoids (swelling).    Marland Kitchen lidocaine-prilocaine (EMLA) cream Apply 1 application topically as needed. 30 g 6  . lubiprostone (AMITIZA) 8 MCG capsule Start with one twice daily by mouth, may increase up to 3 twice daily if needed to help with constipation 60 capsule 1  . magnesium hydroxide (MILK OF MAGNESIA) 400 MG/5ML suspension Take 30 mLs by mouth daily as needed for mild constipation.    . Meth-Hyo-M Bl-Na Phos-Ph Sal (URIBEL) 118 MG CAPS Take 118 mg by mouth 2 (two) times daily.     . mirtazapine (REMERON) 15 MG tablet TAKE (1) TABLET BY MOUTH AT BEDTIME WITH 45 MG TABLET FOR A TOTAL DOSE OF 60 MG. 30 tablet 0  . mirtazapine (REMERON) 45 MG tablet Take 45 mg by mouth at bedtime.   11  . morphine (MS CONTIN) 15 MG 12 hr tablet Take 1 tablet (15 mg total) by mouth every 12 (twelve) hours. 60 tablet 0  . Multiple Vitamins-Minerals (MULTIVITAMIN WITH MINERALS) tablet Take 1 tablet by mouth every morning. [employer] Multi Vitamin    . Nivolumab (OPDIVO IV) Inject into the vein every 14 (fourteen) days.    . NON FORMULARY Take 250 mg by mouth daily. Grape concentrate -used as antioxident    . omeprazole (PRILOSEC) 20 MG capsule Take 1 capsule (20 mg total) by mouth 2 (two) times daily before a meal. 60 capsule 3  .  ondansetron (ZOFRAN) 8 MG tablet Take 1 tablet (8 mg total) by mouth every 8 (eight) hours as needed for nausea or vomiting. 30 tablet 3  . oxyCODONE-acetaminophen (PERCOCET) 10-325 MG per tablet Take 1-2 tablets by mouth every 4 (four) hours as needed for pain. 150 tablet 0  . potassium chloride SA (K-DUR,KLOR-CON) 20 MEQ tablet Take 1 tablet twice a day x 15 days. 30 tablet 1  . predniSONE (DELTASONE) 10 MG tablet Take 50 mg PO daily for side effects of Opdivo as reviewed in chemotherapy teaching. 120 tablet 0  . prochlorperazine (COMPAZINE) 10 MG tablet TAKE (1) TABLET BY MOUTH EVERY SIX HOURS AS NEEDED NAUSEA AND VOMITING. 30 tablet 0  . Propylene Glycol (SYSTANE BALANCE OP) Place 1 drop into both eyes daily.    Marland Kitchen senna (SENOKOT) 8.6 MG TABS tablet Take 1 tablet by mouth 3 (three) times daily as needed for mild constipation.    . tamsulosin (FLOMAX) 0.4 MG CAPS capsule Take 1 capsule (0.4 mg total)  by mouth daily. 30 capsule 3  . tiotropium (SPIRIVA) 18 MCG inhalation capsule Place 18 mcg into inhaler and inhale every evening.     . traZODone (DESYREL) 150 MG tablet Take 2 1/2 to three tablets at night for sleep and depression 90 tablet 3  . vitamin C (ASCORBIC ACID) 500 MG tablet Take 500 mg by mouth daily.     Current Facility-Administered Medications on File Prior to Visit  Medication Dose Route Frequency Provider Last Rate Last Dose  . sodium chloride 0.9 % injection 10 mL  10 mL Intracatheter PRN Patrici Ranks, MD   10 mL at 11/16/14 1253    Past Surgical History  Procedure Laterality Date  . Back surgery    . Left shoulder      arthroscopy  . Rt knee arthroscopy    . Colonoscopy N/A 01/11/2013    Procedure: COLONOSCOPY;  Surgeon: [name], MD;  [facility];  Service: Endoscopy;  Laterality: N/A;  340  . Carpal tunnel release Right   . Transurethral resection of bladder tumor N/A 09/21/2013    Procedure: TRANSURETHRAL RESECTION  PROSTATE  AND BLADDER ;   Surgeon: [name], MD;  Location: [facility];  Service: Urology;  Laterality: N/A;  . Video bronchoscopy with endobronchial navigation Right 10/25/2013    Procedure: VIDEO BRONCHOSCOPY WITH ENDOBRONCHIAL NAVIGATION;  Surgeon: [name], MD;  Location: [facility];  Service: Thoracic;  Laterality: Right;  . Transurethral resection of bladder tumor with gyrus (turbt-gyrus) N/A 01/18/2014    Procedure: TRANSURETHRAL RESECTION OF BLADDER TUMOR WITH GYRUS (TURBT-GYRUS);  Surgeon: [name], MD;  Location: [facility];  Service: Urology;  Laterality: N/A;  . Transurethral resection of prostate N/A 01/18/2014    Procedure: TRANSURETHRAL RESECTION OF THE PROSTATE (TURP);  Surgeon: [name], MD;  Location: [facility];  Service: Urology;  Laterality: N/A;    Denies any headaches, dizziness, double vision, fevers, chills, night sweats, nausea, vomiting, diarrhea, constipation, chest pain, heart palpitations, shortness of breath, blood in stool, black tarry stool, urinary pain, urinary burning, urinary frequency, hematuria.   PHYSICAL EXAMINATION  ECOG PERFORMANCE STATUS: 3 - Symptomatic, >50% confined to bed  Filed Vitals:   11/16/14 1144  BP: 114/65  Pulse: 65  Temp: 98.1 F (36.7 C)  Resp: 16    GENERAL:alert, no distress and cooperative, chronically ill appearing, accompanied by son and wife, in chemo-bed. SKIN: skin color, texture, turgor are normal, positive for: left lateral buttocks pressure wound, healing, stage 2 HEAD: Normocephalic, No masses, lesions, tenderness or abnormalities EYES: normal, PERRLA, EOMI, Conjunctiva are pink and non-injected EARS: External ears normal OROPHARYNX:lips, buccal mucosa, and tongue normal and mucous membranes are moist  NECK: supple, trachea midline LYMPH:  not examined BREAST:not examined LUNGS: not examined HEART: not examined ABDOMEN:abdomen soft BACK: Back symmetric, no curvature. EXTREMITIES:less then 2 second capillary  refill, no joint deformities, effusion, or inflammation, no skin discoloration, no cyanosis  NEURO: alert & oriented x 3 with fluent speech, no focal motor/sensory deficits   LABORATORY DATA: CBC    Component Value Date/Time   WBC 6.7 11/16/2014 1137   RBC 3.74* 11/16/2014 1137   HGB 12.2* 11/16/2014 1137   HCT 36.3* 11/16/2014 1137   PLT 198 11/16/2014 1137   MCV 97.1 11/16/2014 1137   MCH 32.6 11/16/2014 1137   MCHC 33.6 11/16/2014 1137   RDW 14.0 11/16/2014 1137   LYMPHSABS 1.2 11/16/2014 1137   MONOABS 0.6 11/16/2014  1137   EOSABS 0.1 11/16/2014 1137   BASOSABS 0.0 11/16/2014 1137      Chemistry      Component Value Date/Time   NA 139 11/16/2014 1137   K 3.5 11/16/2014 1137   CL 104 11/16/2014 1137   CO2 30 11/16/2014 1137   BUN 10 11/16/2014 1137   CREATININE 0.87 11/16/2014 1137      Component Value Date/Time   CALCIUM 8.7* 11/16/2014 1137   ALKPHOS 66 11/16/2014 1137   AST 16 11/16/2014 1137   ALT 8* 11/16/2014 1137   BILITOT 0.3 11/16/2014 1137        PENDING LABS:   RADIOGRAPHIC STUDIES:  No results found.   PATHOLOGY:    ASSESSMENT AND PLAN:  Squamous cell carcinoma of left lung Recurrent squamous cell carcinoma of lung, S/P XRT by Dr. [name] with progression of disease in July 2016 on CT imaging.  Now with progressive disease on CT imaging in July 2016.  PD-L1 testing is negative.  Started on Nivolumab on 09/21/2014.  Oncology history updated.  Patient was seen by palliative medicine on his last encounter.  Lab are back and good for treatment.  Pre-chemo labs as ordered.  We will need to restage his chest in the near future.  CT imaging will be ordered on next follow-up visit.  Return with next treatment.  Bladder cancer High-grade urothelial carcinoma with prostate involvement, S/P carboplatin/gemzar 02/14/2014- 05/03/2014 followed by carboplatin weekly + XRT finishing on 05/31/2014.  Now on Nivolumab.  Patient complains of  increased pain with BM.  Will perform an MRI of pelvis in the near future to evaluate for malignancy being the cause of this.  If so, we will need to more aggressively (although we have been aggressively all along) discuss end of life issues.  Given his performance status and prognosis, colonoscopy is not likely to be beneficial.    THERAPY PLAN:  Continue with treatment as planned.  Palliative care is following.  Discussion regarding code status and advanced directives took place.  They will discuss this information moving forward.  All questions were answered. The patient knows to call the clinic with any problems, questions or concerns. We can certainly see the patient much sooner if necessary.  45+ minutes was spent in face-to-face discussion with the patient and 60 minutes in total was spent on this encounter.    More than 50% of the time spent with the patient was utilized for counseling and coordination of care.   Patient and plan discussed with Dr. Ancil Linsey and she is in agreement with the aforementioned.   This note is electronically signed by: Doy Mince 11/16/2014 4:15 PM

## 2014-11-16 NOTE — Patient Instructions (Addendum)
[place] at [facility] Discharge Instructions  RECOMMENDATIONS MADE BY THE CONSULTANT AND ANY TEST RESULTS WILL BE SENT TO YOUR REFERRING PHYSICIAN.  We are going to do an MRI of the pelvis.  Please see your appointment list for that appointment date and time.   You will be seen by the MD in two weeks and then in 4 weeks.     Please call us if you have any questions or concerns.    Thank you for choosing [employer] at [facility] to provide your oncology and hematology care.  To afford each patient quality time with our provider, please arrive at least 15 minutes before your scheduled appointment time.    You need to re-schedule your appointment should you arrive 10 or more minutes late.  We strive to give you quality time with our providers, and arriving late affects you and other patients whose appointments are after yours.  Also, if you no show three or more times for appointments you may be dismissed from the clinic at the providers discretion.     Again, thank you for choosing [facility].  Our hope is that these requests will decrease the amount of time that you wait before being seen by our physicians.       _____________________________________________________________  Should you have questions after your visit to [facility], please contact our office at [phone] between the hours of 8:30 a.m. and 4:30 p.m.  Voicemails left after 4:30 p.m. will not be returned until the following business day.  For prescription refill requests, have your pharmacy contact our office.

## 2014-11-19 ENCOUNTER — Other Ambulatory Visit (HOSPITAL_COMMUNITY): Payer: Self-pay | Admitting: Oncology

## 2014-11-19 DIAGNOSIS — E032 Hypothyroidism due to medicaments and other exogenous substances: Secondary | ICD-10-CM | POA: Insufficient documentation

## 2014-11-19 MED ORDER — LEVOTHYROXINE SODIUM 25 MCG PO CAPS: 25.0000 ug | ORAL_CAPSULE | Freq: Every day | ORAL | Status: DC

## 2014-11-27 ENCOUNTER — Encounter (INDEPENDENT_AMBULATORY_CARE_PROVIDER_SITE_OTHER): Payer: Self-pay | Admitting: *Deleted

## 2014-11-30 ENCOUNTER — Encounter (HOSPITAL_COMMUNITY): Payer: Medicare Other | Admitting: Primary Care

## 2014-11-30 ENCOUNTER — Encounter (HOSPITAL_BASED_OUTPATIENT_CLINIC_OR_DEPARTMENT_OTHER): Payer: Medicare Other | Admitting: Oncology

## 2014-11-30 ENCOUNTER — Telehealth (HOSPITAL_COMMUNITY): Payer: Self-pay

## 2014-11-30 ENCOUNTER — Encounter (HOSPITAL_BASED_OUTPATIENT_CLINIC_OR_DEPARTMENT_OTHER): Payer: Medicare Other

## 2014-11-30 ENCOUNTER — Encounter (HOSPITAL_COMMUNITY): Payer: Self-pay

## 2014-11-30 VITALS — BP 121/71 | HR 64 | Temp 98.4°F | Resp 18 | Wt 122.8 lb

## 2014-11-30 DIAGNOSIS — Z5112 Encounter for antineoplastic immunotherapy: Secondary | ICD-10-CM

## 2014-11-30 DIAGNOSIS — C679 Malignant neoplasm of bladder, unspecified: Secondary | ICD-10-CM

## 2014-11-30 DIAGNOSIS — K6289 Other specified diseases of anus and rectum: Secondary | ICD-10-CM

## 2014-11-30 DIAGNOSIS — C3432 Malignant neoplasm of lower lobe, left bronchus or lung: Secondary | ICD-10-CM

## 2014-11-30 DIAGNOSIS — C7982 Secondary malignant neoplasm of genital organs: Secondary | ICD-10-CM | POA: Diagnosis not present

## 2014-11-30 DIAGNOSIS — C3492 Malignant neoplasm of unspecified part of left bronchus or lung: Secondary | ICD-10-CM | POA: Diagnosis not present

## 2014-11-30 LAB — COMPREHENSIVE METABOLIC PANEL
ALT: 8 U/L — ABNORMAL LOW (ref 17–63)
AST: 18 U/L (ref 15–41)
Albumin: 3.1 g/dL — ABNORMAL LOW (ref 3.5–5.0)
Alkaline Phosphatase: 72 U/L (ref 38–126)
Anion gap: 7 (ref 5–15)
BILIRUBIN TOTAL: 0.5 mg/dL (ref 0.3–1.2)
BUN: 10 mg/dL (ref 6–20)
CHLORIDE: 103 mmol/L (ref 101–111)
CO2: 29 mmol/L (ref 22–32)
Calcium: 8.9 mg/dL (ref 8.9–10.3)
Creatinine, Ser: 0.86 mg/dL (ref 0.61–1.24)
Glucose, Bld: 111 mg/dL — ABNORMAL HIGH (ref 65–99)
POTASSIUM: 3.5 mmol/L (ref 3.5–5.1)
Sodium: 139 mmol/L (ref 135–145)
TOTAL PROTEIN: 6.2 g/dL — AB (ref 6.5–8.1)

## 2014-11-30 LAB — CBC WITH DIFFERENTIAL/PLATELET
Basophils Absolute: 0 10*3/uL (ref 0.0–0.1)
Basophils Relative: 0 %
EOS PCT: 3 %
Eosinophils Absolute: 0.2 10*3/uL (ref 0.0–0.7)
HEMATOCRIT: 35.4 % — AB (ref 39.0–52.0)
Hemoglobin: 11.8 g/dL — ABNORMAL LOW (ref 13.0–17.0)
LYMPHS ABS: 1.3 10*3/uL (ref 0.7–4.0)
LYMPHS PCT: 21 %
MCH: 32.5 pg (ref 26.0–34.0)
MCHC: 33.3 g/dL (ref 30.0–36.0)
MCV: 97.5 fL (ref 78.0–100.0)
MONO ABS: 0.8 10*3/uL (ref 0.1–1.0)
MONOS PCT: 12 %
NEUTROS ABS: 3.9 10*3/uL (ref 1.7–7.7)
Neutrophils Relative %: 64 %
PLATELETS: 169 10*3/uL (ref 150–400)
RBC: 3.63 MIL/uL — ABNORMAL LOW (ref 4.22–5.81)
RDW: 15 % (ref 11.5–15.5)
WBC: 6.1 10*3/uL (ref 4.0–10.5)

## 2014-11-30 MED ORDER — SODIUM CHLORIDE 0.9 % IV SOLN
Freq: Once | INTRAVENOUS | Status: AC
  Administered 2014-11-30: 13:00:00 via INTRAVENOUS

## 2014-11-30 MED ORDER — HEPARIN SOD (PORK) LOCK FLUSH 100 UNIT/ML IV SOLN
500.0000 [IU] | Freq: Once | INTRAVENOUS | Status: AC | PRN
  Administered 2014-11-30: 500 [IU]

## 2014-11-30 MED ORDER — SODIUM CHLORIDE 0.9 % IJ SOLN: 10.0000 mL | INTRAMUSCULAR | Status: DC | PRN

## 2014-11-30 MED ORDER — SODIUM CHLORIDE 0.9 % IV SOLN
3.0000 mg/kg | Freq: Once | INTRAVENOUS | Status: AC
  Administered 2014-11-30: 170 mg via INTRAVENOUS
  Filled 2014-11-30: qty 10

## 2014-11-30 MED ORDER — FENTANYL 12 MCG/HR TD PT72: 12.5000 ug | MEDICATED_PATCH | TRANSDERMAL | Status: DC

## 2014-11-30 NOTE — Assessment & Plan Note (Signed)
Recurrent squamous cell carcinoma of lung, S/P XRT by Dr. [name] with progression of disease in July 2016 on CT imaging.  Now with progressive disease on CT imaging in July 2016.  PD-L1 testing is negative.  Started on Nivolumab on 09/21/2014.  Oncology history is up-to-date.  Patient was seen by palliative medicine today.  Lab are back and good for treatment.  Pre-chemo labs as ordered.  MRI of pelvis is scheduled for 10/27.  If no progression of disease is noted, the patient will be set-up for CT of chest for restaging and to evaluate response to therapy.  If progression of disease is noted on either imaging, the patient would benefit most from ceasing of therapy and pursuing comfort measures only.  Return with next treatment.

## 2014-11-30 NOTE — Assessment & Plan Note (Addendum)
High-grade urothelial carcinoma with prostate involvement, S/P carboplatin/gemzar 02/14/2014- 05/03/2014 followed by carboplatin weekly + XRT finishing on 05/31/2014.  Now on Nivolumab for progressive NSCLC following curative radiation, thereby radiation resistant disease.  Patient complains of increased rectal pain.  Discussion regarding long-acting pain medication seemed successful.  It sounds like he will try Fentanyl 12.5 mcg/hr.  I have reviewed the risks, benefits, alternatives, and side effects including but not limited to anaphylaxis, death, drowsiness, constipation, nausea, vomiting.  He will have Percocet for breakthrough pain management.  He will start Fentanyl on Sunday AM and update [place] accordingly on Monday AM or Tuesday AM for further directions depending on response to this therapy.  They have this medication at home.  Patient seen by Ms. Dove, palliative care today.  She expresses concerned regarding inadequate dosing of Fentanyl and she discussed with me the possibility of 2 patches for 25 mcg.  After a short discussion, together, we have agreed to start at 12.5 mcg/hr to make sure he tolerates this due to his obvious fear of pain medication.  If he tolerates well, but pain is not improved, I would not hesitate to increase to 25 mcg/hr and titrate upwards according to needs.  MRI of pelvis is scheduled for 12/06/2014 to evaluate for tumor involvement causing rectal pain.  It tumor has progressed, very frank comment was made that we will be forced to make very difficult decisions and therapy will be discontinued.  I think, the patient's wife understood this as I was very clear in my delivery.  She and her husband and son have been resistant to discussion around death and dying.  They have been hopeful for miracles.    If no progression of disease is noted on pelvic imaging, then he will need to be set-up for CT chest imaging to evaluate disease status, while managing rectal pain.  Again, if  progression is noted, we will stop therapy and discuss goals of care.  Given his performance status and prognosis, colonoscopy is not likely to be beneficial.  Anusol appeal process is underway  Return on 11/4 as scheduled for follow-up.

## 2014-11-30 NOTE — Patient Instructions (Addendum)
[facility] Discharge Instructions for Patients Receiving Chemotherapy  Today you received the following chemotherapy agents opdivo  Exam completed by Kirby Crigler today Return as scheduled Please call the clinic if you have any questions or concerns   To help prevent nausea and vomiting after your treatment, we encourage you to take your nausea medication    If you develop nausea and vomiting, or diarrhea that is not controlled by your medication, call the clinic.  The clinic phone number is [phone]. Office hours are Monday-Friday 8:30am-5:00pm.  BELOW ARE SYMPTOMS THAT SHOULD BE REPORTED IMMEDIATELY:  *FEVER GREATER THAN 101.0 F  *CHILLS WITH OR WITHOUT FEVER  NAUSEA AND VOMITING THAT IS NOT CONTROLLED WITH YOUR NAUSEA MEDICATION  *UNUSUAL SHORTNESS OF BREATH  *UNUSUAL BRUISING OR BLEEDING  TENDERNESS IN MOUTH AND THROAT WITH OR WITHOUT PRESENCE OF ULCERS  *URINARY PROBLEMS  *BOWEL PROBLEMS  UNUSUAL RASH Items with * indicate a potential emergency and should be followed up as soon as possible. If you have an emergency after office hours please contact your primary care physician or go to the nearest emergency department.  Please call the clinic during office hours if you have any questions or concerns.   You may also contact the Patient Navigator at [phone] should you have any questions or need assistance in obtaining follow up care. _____________________________________________________________________ Have you asked about our STAR program?    STAR stands for Survivorship Training and Rehabilitation, and this is a nationally recognized cancer care program that focuses on survivorship and rehabilitation.  Cancer and cancer treatments may cause problems, such as, pain, making you feel tired and keeping you from doing the things that you need or want to do. Cancer rehabilitation can help. Our goal is to reduce these troubling effects and help you  have the best quality of life possible.  You may receive a survey from a nurse that asks questions about your current state of health.  Based on the survey results, all eligible patients will be referred to the [facility] program for an evaluation so we can better serve you! A frequently asked questions sheet is available upon request.

## 2014-11-30 NOTE — Progress Notes (Signed)
Brian Bogus, MD [place] [address]  Malignant neoplasm of urinary bladder, unspecified site [facility])  Squamous cell carcinoma of left lung ([place])  CURRENT THERAPY: Nivolumab beginning on 09/21/2014 for PD-L1 recurrent squamous cell carcinoma of lung  INTERVAL HISTORY: PRAISE DOLECKI 64 y.o. male returns for followup of recurrent squamous cell carcinoma of lung, S/P XRT by Dr. [name] with progression of disease in July 2016 on CT imaging. AND High-grade urothelial carcinoma with prostate involvement, S/P carboplatin/gemzar 02/14/2014- 05/03/2014 followed by carboplatin weekly + XRT finishing on 05/31/2014.    Squamous cell carcinoma of left lung ([place])   10/25/2013 Pathology Results Diagnosis: TRANSBRONCHIAL NEEDLE ASPIRATION NAVIGATION #2, LEFT LOWER LOBE BRUSHING MALIGNANT CELLS PRESENT, CONSISTENT WITH NON-SMALL CELL CARCINOMA.   10/25/2013 Pathology Results Diagnosis: TRANSBRONCHIAL NEEDLE ASPIRATION NAVIGATION #1, LEFT LOWER LOBE. MALIGNANT CELLS PRESENT, CONSISTENT WITH NON-SMALL CELL CARCINOMA. THE FEATURES ARE SUGGESTIVE OF SQUAMOUS CELL CARCINOMA.   10/25/2013 Initial Diagnosis T1N0 SCCA of the left lower lobe    Radiation Therapy    05/17/2014 Imaging CT chest- Persistent but improved appearance of left lower lobe lung mass. Small pulmonary nodules in the right lung are unchanged. No new or progressive disease identified.   08/30/2014 Progression CT chest- Significant enlargement of left lower lobe mass,   09/21/2014 -  Chemotherapy Nivolumab every 2 weeks   10/22/2014 Pathology Results PD-L1 negative    Bladder cancer ([place])   01/18/2014 Initial Diagnosis 1. Bladder, biopsy, right lateral wall of bladder - UROTHELIAL CARCINOMA IN SITU, NO STROMAL INVASION IDENTIFIED.  3. Prostate, TUR, deep prostatic tissue - HIGH GRADE UROTHELIAL CARCINOMA WITH ASSOCIATED TUMOR NECROSIS, INVOLVING PROSTATIC DUCTS    02/14/2014 - 05/03/2014 Chemotherapy  Carboplatin/Gemzar   05/09/2014 - 06/28/2014 Radiation Therapy Pelvis and bladder boost by Dr. [name].   05/11/2014 Treatment Plan Change Change chemotherapy to weekly Carboplatin (AUC 2)   05/11/2014 - 05/31/2014 Chemotherapy Weekly Carboplatin with XRT at  AUC 2.   08/30/2014 Remission Ct abd.pelvis- Diffuse thickening of the urinary bladder, without a clearly identifiable bladder wall mass.    I personally reviewed and went over laboratory results with the patient.  The results are noted within this dictation.  Labs are stable.  Anusol was denied by his insurance.  The patient's wife brought [place] paperwork with directions for the appeal process.  The appeal process is underway.  He complains of "horrific rectal pain."  Countless times, we have discussed his pain regimen which he refuses to follow appropriately.  He has been quartering his percocet with poor pain control in fear of side effects associated with his bowel (this too has been an endless issue without any resolution despite multiple medical attempts to correct).  Finally today, there was a productive conversation regarding Fentanyl.  He is provided education regarding Fentanyl.  He has the medication at home.  He reports a history of difficulty sleeping while on this medication.  He is not sleeping currently due to pain.  He thinks his rectal pain is secondary to hemorrhoids versus cysts versus fissures.  He is scheduled for a pelvic MRI to evaluate for tumor invasion that may explain his pain.  CT in the past have not been helpful/revealing.  If he has progression of disease, the patient and his family were told in no confusing way that difficult decisions will be made at that time resulting in the discontinuation of therapy.  If negative, we will symptomatically treat his pain.  We would then pursue a CT of chest at a later date to evaluate response of pulmonary cancer that was previously irradiated by Dr. [name].  This frank discussion, I  think, helped his wife the reality of what we are dealing with.  To date, they still have not discussed goals of care and end of life issues.  Past Medical History  Diagnosis Date  . COPD (chronic obstructive pulmonary disease) ([place])   . Inflammatory bowel disease   . Constipation   . Lesion of bladder   . Prostate mass   . Dysuria-frequency syndrome     wears depends  . Arthritis   . Shortness of breath   . Depression   . Anxiety   . Kidney stones   . GERD (gastroesophageal reflux disease)   . Cancer ([place]) 2015    LT lung, bladder and prostate    has IBS (irritable bowel syndrome); Back pain; Heme positive stool; Squamous cell carcinoma of left lung ([place]); Bladder cancer ([place]); Tobacco abuse; Hematuria; Neoplasm related pain; and Hypothyroidism due to medication on his problem list.     is allergic to lyrica; gabapentin; and morphine and related.  Current Outpatient Prescriptions on File Prior to Visit  Medication Sig Dispense Refill  . ALPRAZolam (XANAX) 1 MG tablet Take 0.5 mg by mouth every 2 (two) hours.     Marland Kitchen buPROPion (WELLBUTRIN XL) 150 MG 24 hr tablet Take 2 tablets (300 mg total) by mouth daily. 60 tablet 2  . Cholecalciferol (VITAMIN D3) 5000 UNITS CAPS Take 1 capsule by mouth daily.    . Cholecalciferol 5000 UNITS capsule Take 1 capsule (5,000 Units total) by mouth daily. 30 capsule 4  . Dextromethorphan Polistirex (DELSYM PO) Take 15 mLs by mouth as needed.    . docusate sodium (COLACE) 100 MG capsule Take 200 mg by mouth at bedtime.    . hydrocortisone (ANUSOL-HC) 25 MG suppository Place 1 suppository (25 mg total) rectally 2 (two) times daily. 12 suppository 0  . hydrocortisone (PROCTOZONE-HC) 2.5 % rectal cream Place 1 application rectally 3 (three) times daily as needed for hemorrhoids (swelling).    . Levothyroxine Sodium 25 MCG CAPS Take 1 capsule (25 mcg total) by mouth daily before breakfast. 30 capsule 2  . lidocaine-prilocaine (EMLA) cream Apply 1  application topically as needed. 30 g 6  . lubiprostone (AMITIZA) 8 MCG capsule Start with one twice daily by mouth, may increase up to 3 twice daily if needed to help with constipation 60 capsule 1  . magnesium hydroxide (MILK OF MAGNESIA) 400 MG/5ML suspension Take 30 mLs by mouth daily as needed for mild constipation.    . Meth-Hyo-M Bl-Na Phos-Ph Sal (URIBEL) 118 MG CAPS Take 118 mg by mouth 2 (two) times daily.     . mirtazapine (REMERON) 15 MG tablet TAKE (1) TABLET BY MOUTH AT BEDTIME WITH 45 MG TABLET FOR A TOTAL DOSE OF 60 MG. 30 tablet 0  . mirtazapine (REMERON) 45 MG tablet Take 45 mg by mouth at bedtime.   11  . morphine (MS CONTIN) 15 MG 12 hr tablet Take 1 tablet (15 mg total) by mouth every 12 (twelve) hours. 60 tablet 0  . Multiple Vitamins-Minerals (MULTIVITAMIN WITH MINERALS) tablet Take 1 tablet by mouth every morning. [employer] Multi Vitamin    . Nivolumab (OPDIVO IV) Inject into the vein every 14 (fourteen) days.    . NON FORMULARY Take 250 mg by mouth daily. Grape concentrate -used as antioxident    .  omeprazole (PRILOSEC) 20 MG capsule Take 1 capsule (20 mg total) by mouth 2 (two) times daily before a meal. 60 capsule 3  . ondansetron (ZOFRAN) 8 MG tablet Take 1 tablet (8 mg total) by mouth every 8 (eight) hours as needed for nausea or vomiting. 30 tablet 3  . oxyCODONE-acetaminophen (PERCOCET) 10-325 MG per tablet Take 1-2 tablets by mouth every 4 (four) hours as needed for pain. 150 tablet 0  . potassium chloride SA (K-DUR,KLOR-CON) 20 MEQ tablet Take 1 tablet twice a day x 15 days. 30 tablet 1  . predniSONE (DELTASONE) 10 MG tablet Take 50 mg PO daily for side effects of Opdivo as reviewed in chemotherapy teaching. 120 tablet 0  . prochlorperazine (COMPAZINE) 10 MG tablet TAKE (1) TABLET BY MOUTH EVERY SIX HOURS AS NEEDED NAUSEA AND VOMITING. 30 tablet 0  . Propylene Glycol (SYSTANE BALANCE OP) Place 1 drop into both eyes daily.    Marland Kitchen senna (SENOKOT) 8.6 MG TABS tablet  Take 1 tablet by mouth 3 (three) times daily as needed for mild constipation.    . tamsulosin (FLOMAX) 0.4 MG CAPS capsule Take 1 capsule (0.4 mg total) by mouth daily. 30 capsule 3  . tiotropium (SPIRIVA) 18 MCG inhalation capsule Place 18 mcg into inhaler and inhale every evening.     . traZODone (DESYREL) 150 MG tablet Take 2 1/2 to three tablets at night for sleep and depression 90 tablet 3  . vitamin C (ASCORBIC ACID) 500 MG tablet Take 500 mg by mouth daily.     Current Facility-Administered Medications on File Prior to Visit  Medication Dose Route Frequency Provider Last Rate Last Dose  . sodium chloride 0.9 % injection 10 mL  10 mL Intracatheter PRN Patrici Ranks, MD        Past Surgical History  Procedure Laterality Date  . Back surgery    . Left shoulder      arthroscopy  . Rt knee arthroscopy    . Colonoscopy N/A 01/11/2013    Procedure: COLONOSCOPY;  Surgeon: [name], MD;  [facility];  Service: Endoscopy;  Laterality: N/A;  340  . Carpal tunnel release Right   . Transurethral resection of bladder tumor N/A 09/21/2013    Procedure: TRANSURETHRAL RESECTION  PROSTATE  AND BLADDER ;  Surgeon: [name], MD;  Location: [facility];  Service: Urology;  Laterality: N/A;  . Video bronchoscopy with endobronchial navigation Right 10/25/2013    Procedure: VIDEO BRONCHOSCOPY WITH ENDOBRONCHIAL NAVIGATION;  Surgeon: [name], MD;  Location: [place];  Service: Thoracic;  Laterality: Right;  . Transurethral resection of bladder tumor with gyrus (turbt-gyrus) N/A 01/18/2014    Procedure: TRANSURETHRAL RESECTION OF BLADDER TUMOR WITH GYRUS (TURBT-GYRUS);  Surgeon: [name], MD;  Location: [facility];  Service: Urology;  Laterality: N/A;  . Transurethral resection of prostate N/A 01/18/2014    Procedure: TRANSURETHRAL RESECTION OF THE PROSTATE (TURP);  Surgeon: [name], MD;  Location: [facility];  Service: Urology;  Laterality: N/A;    Denies  any headaches, dizziness, double vision, fevers, chills, night sweats, nausea, vomiting, diarrhea, constipation, chest pain, heart palpitations, shortness of breath, blood in stool, black tarry stool, urinary pain, urinary burning, urinary frequency, hematuria.   PHYSICAL EXAMINATION  ECOG PERFORMANCE STATUS: 3 - Symptomatic, >50% confined to bed  There were no vitals filed for this visit.  GENERAL:alert, no distress and cooperative, chronically ill appearing, accompanied by son and wife, in chemo-bed.  SKIN: skin color, texture, turgor are normal, positive for: left lateral buttocks pressure wound, healing, stage 2 HEAD: Normocephalic, No masses, lesions, tenderness or abnormalities EYES: normal, PERRLA, EOMI, Conjunctiva are pink and non-injected EARS: External ears normal OROPHARYNX:lips, buccal mucosa, and tongue normal and mucous membranes are moist  NECK: supple, trachea midline LYMPH:  not examined BREAST:not examined LUNGS: not examined HEART: not examined ABDOMEN:abdomen soft BACK: Back symmetric, no curvature. EXTREMITIES: no skin discoloration NEURO: alert & oriented x 3 with fluent speech  LABORATORY DATA: CBC    Component Value Date/Time   WBC 6.1 11/30/2014 1215   RBC 3.63* 11/30/2014 1215   HGB 11.8* 11/30/2014 1215   HCT 35.4* 11/30/2014 1215   PLT 169 11/30/2014 1215   MCV 97.5 11/30/2014 1215   MCH 32.5 11/30/2014 1215   MCHC 33.3 11/30/2014 1215   RDW 15.0 11/30/2014 1215   LYMPHSABS 1.3 11/30/2014 1215   MONOABS 0.8 11/30/2014 1215   EOSABS 0.2 11/30/2014 1215   BASOSABS 0.0 11/30/2014 1215      Chemistry      Component Value Date/Time   NA 139 11/30/2014 1215   K 3.5 11/30/2014 1215   CL 103 11/30/2014 1215   CO2 29 11/30/2014 1215   BUN 10 11/30/2014 1215   CREATININE 0.86 11/30/2014 1215      Component Value Date/Time   CALCIUM 8.9 11/30/2014 1215   ALKPHOS 72 11/30/2014 1215   AST 18 11/30/2014 1215   ALT 8* 11/30/2014 1215   BILITOT  0.5 11/30/2014 1215        PENDING LABS:   RADIOGRAPHIC STUDIES:  No results found.   PATHOLOGY:    ASSESSMENT AND PLAN:  Bladder cancer High-grade urothelial carcinoma with prostate involvement, S/P carboplatin/gemzar 02/14/2014- 05/03/2014 followed by carboplatin weekly + XRT finishing on 05/31/2014.  Now on Nivolumab for progressive NSCLC following curative radiation, thereby radiation resistant disease.  Patient complains of increased rectal pain.  Discussion regarding long-acting pain medication seemed successful.  It sounds like he will try Fentanyl 12.5 mcg/hr.  I have reviewed the risks, benefits, alternatives, and side effects including but not limited to anaphylaxis, death, drowsiness, constipation, nausea, vomiting.  He will have Percocet for breakthrough pain management.  He will start Fentanyl on Sunday AM and update [place] accordingly on Monday AM or Tuesday AM for further directions depending on response to this therapy.  They have this medication at home.  Patient seen by Ms. Dove, palliative care today.  She expresses concerned regarding inadequate dosing of Fentanyl and she discussed with me the possibility of 2 patches for 25 mcg.  After a short discussion, together, we have agreed to start at 12.5 mcg/hr to make sure he tolerates this due to his obvious fear of pain medication.  If he tolerates well, but pain is not improved, I would not hesitate to increase to 25 mcg/hr and titrate upwards according to needs.  MRI of pelvis is scheduled for 12/06/2014 to evaluate for tumor involvement causing rectal pain.  It tumor has progressed, very frank comment was made that we will be forced to make very difficult decisions and therapy will be discontinued.  I think, the patient's wife understood this as I was very clear in my delivery.  She and her husband and son have been resistant to discussion around death and dying.  They have been hopeful for miracles.    If no progression of  disease is noted on pelvic imaging, then he will need to be set-up  for CT chest imaging to evaluate disease status, while managing rectal pain.  Again, if progression is noted, we will stop therapy and discuss goals of care.  Given his performance status and prognosis, colonoscopy is not likely to be beneficial.  Return on 11/4 as scheduled for follow-up.  Squamous cell carcinoma of left lung Recurrent squamous cell carcinoma of lung, S/P XRT by Dr. [name] with progression of disease in July 2016 on CT imaging.  Now with progressive disease on CT imaging in July 2016.  PD-L1 testing is negative.  Started on Nivolumab on 09/21/2014.  Oncology history is up-to-date.  Patient was seen by palliative medicine today.  Lab are back and good for treatment.  Pre-chemo labs as ordered.  MRI of pelvis is scheduled for 10/27.  If no progression of disease is noted, the patient will be set-up for CT of chest for restaging and to evaluate response to therapy.  If progression of disease is noted on either imaging, the patient would benefit most from ceasing of therapy and pursuing comfort measures only.  Return with next treatment.    THERAPY PLAN:  Continue with treatment as planned.  Palliative care is following.  Discussion regarding code status and advanced directives took place.  They will discuss this information moving forward.  All questions were answered. The patient knows to call the clinic with any problems, questions or concerns. We can certainly see the patient much sooner if necessary.  25+ minutes was spent in face-to-face discussion with the patient and 40 minutes in total was spent on this encounter.   More than 50% of the time spent with the patient was utilized for counseling and coordination of care.  Patient and plan discussed with Dr. Ancil Linsey and she is in agreement with the aforementioned.   This note is electronically signed by: Doy Mince 11/30/2014 4:22 PM

## 2014-11-30 NOTE — Telephone Encounter (Signed)
-----   Message from [facility], PA-C sent at 11/30/2014  4:25 PM EDT ----- Make sure they know that if he is going to try Fentanyl patch, he is not to take MS contin.  He can still take his Percocet.  TK

## 2014-11-30 NOTE — Progress Notes (Signed)
Brian Sims Tolerated chemotherapy well today Discharged via wheelchair

## 2014-11-30 NOTE — Patient Instructions (Signed)
[place] at [facility] Discharge Instructions  RECOMMENDATIONS MADE BY THE CONSULTANT AND ANY TEST RESULTS WILL BE SENT TO YOUR REFERRING PHYSICIAN.  Exam and discussion by Robynn Pane, PA-C Call with any concerns. You can try the Fentanyl 12.5 mcg patch beginning Sunday morning.  Call us on Monday with any issues or concerns Anusol appeal is underway with Development worker, community. MRI as scheduled Follow-up as scheduled.  Thank you for choosing [place] at [facility] to provide your oncology and hematology care.  To afford each patient quality time with our provider, please arrive at least 15 minutes before your scheduled appointment time.    You need to re-schedule your appointment should you arrive 10 or more minutes late.  We strive to give you quality time with our providers, and arriving late affects you and other patients whose appointments are after yours.  Also, if you no show three or more times for appointments you may be dismissed from the clinic at the providers discretion.     Again, thank you for choosing [facility].  Our hope is that these requests will decrease the amount of time that you wait before being seen by our physicians.       _____________________________________________________________  Should you have questions after your visit to [facility], please contact our office at [phone] between the hours of 8:30 a.m. and 4:30 p.m.  Voicemails left after 4:30 p.m. will not be returned until the following business day.  For prescription refill requests, have your pharmacy contact our office.

## 2014-11-30 NOTE — Telephone Encounter (Signed)
Message left for patient.  Call back confirmation requested. 

## 2014-12-03 ENCOUNTER — Telehealth (HOSPITAL_COMMUNITY): Payer: Self-pay

## 2014-12-03 NOTE — Telephone Encounter (Signed)
Call to patient and states that the Fentanyl patch 12.5 mcg has not made any difference in pain management.  Discussed with Robynn Pane, PA-C and patient instructed to remove current patch and apply 2 new 12.5 mcg patches.  Verbalized understanding of instructions.  To call back with any problems.

## 2014-12-04 ENCOUNTER — Other Ambulatory Visit (HOSPITAL_COMMUNITY): Payer: Self-pay | Admitting: Oncology

## 2014-12-04 ENCOUNTER — Encounter: Payer: Self-pay | Admitting: *Deleted

## 2014-12-04 ENCOUNTER — Telehealth (HOSPITAL_COMMUNITY): Payer: Self-pay | Admitting: *Deleted

## 2014-12-04 DIAGNOSIS — C679 Malignant neoplasm of bladder, unspecified: Secondary | ICD-10-CM

## 2014-12-04 DIAGNOSIS — G893 Neoplasm related pain (acute) (chronic): Secondary | ICD-10-CM

## 2014-12-04 DIAGNOSIS — C3492 Malignant neoplasm of unspecified part of left bronchus or lung: Secondary | ICD-10-CM

## 2014-12-04 MED ORDER — FENTANYL 25 MCG/HR TD PT72: 25.0000 ug | MEDICATED_PATCH | TRANSDERMAL | Status: DC

## 2014-12-04 NOTE — Progress Notes (Signed)
[place] Clinical Social Work  Clinical Social Work was referred by nurse for assessment of psychosocial needs due to questions about cancer policy. Clinical Social Worker last spoke to pt and wife about this last summer. They need help reviewing policy and guidance submitting claim. CSW agreeable to meet with wife next Tuesday to review and attempt to assist.  Clinical Social Work interventions:  Loren Racer, [place] Tuesdays   [phone]

## 2014-12-04 NOTE — Telephone Encounter (Signed)
Ready for pick-up  KEFALAS,THOMAS, PA-C

## 2014-12-05 ENCOUNTER — Other Ambulatory Visit (HOSPITAL_COMMUNITY): Payer: Self-pay | Admitting: Oncology

## 2014-12-05 DIAGNOSIS — C3492 Malignant neoplasm of unspecified part of left bronchus or lung: Secondary | ICD-10-CM

## 2014-12-05 DIAGNOSIS — C679 Malignant neoplasm of bladder, unspecified: Secondary | ICD-10-CM

## 2014-12-05 MED ORDER — FENTANYL 12 MCG/HR TD PT72: 12.5000 ug | MEDICATED_PATCH | TRANSDERMAL | Status: DC

## 2014-12-06 ENCOUNTER — Ambulatory Visit (HOSPITAL_COMMUNITY)
Admission: RE | Admit: 2014-12-06 | Discharge: 2014-12-06 | Disposition: A | Discharge status: Home / Self Care | Payer: Medicare Other | Source: Ambulatory Visit | Attending: Oncology | Admitting: Oncology

## 2014-12-06 DIAGNOSIS — Z8551 Personal history of malignant neoplasm of bladder: Secondary | ICD-10-CM | POA: Insufficient documentation

## 2014-12-06 DIAGNOSIS — Z85118 Personal history of other malignant neoplasm of bronchus and lung: Secondary | ICD-10-CM | POA: Insufficient documentation

## 2014-12-06 DIAGNOSIS — R1084 Generalized abdominal pain: Secondary | ICD-10-CM | POA: Insufficient documentation

## 2014-12-06 DIAGNOSIS — R9349 Abnormal radiologic findings on diagnostic imaging of other urinary organs: Secondary | ICD-10-CM | POA: Diagnosis not present

## 2014-12-06 DIAGNOSIS — C679 Malignant neoplasm of bladder, unspecified: Secondary | ICD-10-CM

## 2014-12-06 MED ORDER — GADOBENATE DIMEGLUMINE 529 MG/ML IV SOLN
11.0000 mL | Freq: Once | INTRAVENOUS | Status: AC | PRN
  Administered 2014-12-06: 11 mL via INTRAVENOUS

## 2014-12-11 ENCOUNTER — Encounter: Payer: Self-pay | Admitting: *Deleted

## 2014-12-11 NOTE — Progress Notes (Signed)
[place] Clinical Social Work  Clinical Social Work met with pt's wife and son as they requested to review cancer insurance policy. Wife had discussed this policy for over one year, but had not followed through on filing a claim. CSW looked at policy as requested and tried to explain to son and wife how to file said claim and receive assistance. Wife got anxious and stated she had to leave. CSW explained she had tried to assist, but family left agitated. CSW provided them with [facility] as an additional resource.   Clinical Social Work interventions:  Resource education and assistance   Grier Miria Cappelli, [place] Tuesdays   [phone]

## 2014-12-14 ENCOUNTER — Encounter (HOSPITAL_COMMUNITY): Payer: Medicare Other | Attending: Oncology | Admitting: Hematology & Oncology

## 2014-12-14 ENCOUNTER — Encounter (HOSPITAL_COMMUNITY): Payer: Self-pay | Admitting: Hematology & Oncology

## 2014-12-14 VITALS — BP 96/66 | HR 83 | Temp 98.5°F | Resp 18 | Wt 124.8 lb

## 2014-12-14 DIAGNOSIS — Z720 Tobacco use: Secondary | ICD-10-CM | POA: Insufficient documentation

## 2014-12-14 DIAGNOSIS — R319 Hematuria, unspecified: Secondary | ICD-10-CM | POA: Insufficient documentation

## 2014-12-14 DIAGNOSIS — C7982 Secondary malignant neoplasm of genital organs: Secondary | ICD-10-CM | POA: Diagnosis not present

## 2014-12-14 DIAGNOSIS — F329 Major depressive disorder, single episode, unspecified: Secondary | ICD-10-CM

## 2014-12-14 DIAGNOSIS — C3432 Malignant neoplasm of lower lobe, left bronchus or lung: Secondary | ICD-10-CM | POA: Diagnosis not present

## 2014-12-14 DIAGNOSIS — R102 Pelvic and perineal pain: Secondary | ICD-10-CM | POA: Diagnosis not present

## 2014-12-14 DIAGNOSIS — C3492 Malignant neoplasm of unspecified part of left bronchus or lung: Secondary | ICD-10-CM

## 2014-12-14 DIAGNOSIS — C679 Malignant neoplasm of bladder, unspecified: Secondary | ICD-10-CM | POA: Insufficient documentation

## 2014-12-14 MED ORDER — FENTANYL 25 MCG/HR TD PT72: 25.0000 ug | MEDICATED_PATCH | TRANSDERMAL | Status: AC

## 2014-12-14 NOTE — Progress Notes (Signed)
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 He also was treated for a Stage I NSCLC, squamous cell histology by Dr. [name]. He has started treatment with nivolumab secondary to progression of his previously treated lung primary.  Brian Sims is here today with his wife and son. His wife has brought a large collection of family photos which she is very excited to share.  Mr. Wahlstrom reports that he's using 2/12.5 fentanyl patches. He states that there are times when he feels "great," and that there are times when he "feels good, but then doesn't feel good."  His wife says he's been sleeping much better lately. She states that he's in bed most of the day but much more alert. He is also more interactive participating in family conversations.   With regards to his pain, Brian Sims personally states "It seems like it's getting to the point where it's feeling better," instead of just laying there in pain. His wife confirms that his agony seems less.  His general demeanor seems improved. He is able to ambulate to the exam table, and is not in a wheelchair.  He asks if it's normal on the fentanyl to feel dizzy and groggy when he wakes up first thing in the morning. He notes that this quickly resolves.     Squamous cell carcinoma of left lung ([place])   10/25/2013 Pathology Results Diagnosis: TRANSBRONCHIAL NEEDLE ASPIRATION NAVIGATION #2, LEFT LOWER LOBE BRUSHING MALIGNANT  CELLS PRESENT, CONSISTENT WITH NON-SMALL CELL CARCINOMA.   10/25/2013 Pathology Results Diagnosis: TRANSBRONCHIAL NEEDLE ASPIRATION NAVIGATION #1, LEFT LOWER LOBE. MALIGNANT CELLS PRESENT, CONSISTENT WITH NON-SMALL CELL CARCINOMA. THE FEATURES ARE SUGGESTIVE OF SQUAMOUS CELL CARCINOMA.   10/25/2013 Initial Diagnosis T1N0 SCCA of the left lower lobe    Radiation Therapy    05/17/2014 Imaging CT chest- Persistent but improved appearance of left lower lobe lung mass. Small pulmonary nodules in the right lung are unchanged. No new or progressive disease identified.   08/30/2014 Progression CT chest- Significant enlargement of left lower lobe mass,   09/21/2014 -  Chemotherapy Nivolumab every 2 weeks   10/22/2014 Pathology Results PD-L1 negative    Bladder cancer (Brian Sims)   01/18/2014 Initial Diagnosis 1. Bladder, biopsy, right lateral wall of bladder - UROTHELIAL CARCINOMA IN SITU, NO STROMAL INVASION IDENTIFIED.  3. Prostate, TUR, deep prostatic tissue - HIGH GRADE UROTHELIAL CARCINOMA WITH ASSOCIATED TUMOR NECROSIS, INVOLVING PROSTATIC DUCTS    02/14/2014 - 05/03/2014 Chemotherapy Carboplatin/Gemzar   05/09/2014 - 06/28/2014 Radiation Therapy Pelvis and bladder boost by Dr. [name].   05/11/2014 Treatment Plan Change Change chemotherapy to weekly Carboplatin (AUC 2)   05/11/2014 - 05/31/2014 Chemotherapy Weekly Carboplatin with XRT at  AUC 2.   08/30/2014 Remission Ct abd.pelvis- Diffuse thickening of the urinary bladder, without a clearly identifiable bladder wall mass.     Past Medical History  Diagnosis Date  . COPD (chronic obstructive pulmonary disease) ([place])   . Inflammatory bowel disease   . Constipation   . Lesion of bladder   .  Prostate mass   . Dysuria-frequency syndrome     wears depends  . Arthritis   . Shortness of breath   . Depression   . Anxiety   . Kidney stones   . GERD (gastroesophageal reflux disease)   . Cancer (Brian Sims) 2015    LT lung, bladder and prostate    has IBS (irritable  bowel syndrome); Back pain; Heme positive stool; Squamous cell carcinoma of left lung (Brian Sims); Bladder cancer (Brian Sims); Tobacco abuse; Hematuria; Neoplasm related pain; and Hypothyroidism due to medication on his problem list.     is allergic to lyrica; gabapentin; and morphine and related.  Mr. Brian Sims does not currently have medications on file.  Past Surgical History  Procedure Laterality Date  . Back surgery    . Left shoulder      arthroscopy  . Rt knee arthroscopy    . Colonoscopy N/A 01/11/2013    Procedure: COLONOSCOPY;  Surgeon: [name], MD;  Location: [facility];  Service: Endoscopy;  Laterality: N/A;  340  . Carpal tunnel release Right   . Transurethral resection of bladder tumor N/A 09/21/2013    Procedure: TRANSURETHRAL RESECTION  PROSTATE  AND BLADDER ;  Surgeon: [name], MD;  [facility];  Service: Urology;  Laterality: N/A;  . Video bronchoscopy with endobronchial navigation Right 10/25/2013    Procedure: VIDEO BRONCHOSCOPY WITH ENDOBRONCHIAL NAVIGATION;  Surgeon: [name], MD;  Location: [facility];  Service: Thoracic;  Laterality: Right;  . Transurethral resection of bladder tumor with gyrus (turbt-gyrus) N/A 01/18/2014    Procedure: TRANSURETHRAL RESECTION OF BLADDER TUMOR WITH GYRUS (TURBT-GYRUS);  Surgeon: [name], MD;  Location: [facility];  Service: Urology;  Laterality: N/A;  . Transurethral resection of prostate N/A 01/18/2014    Procedure: TRANSURETHRAL RESECTION OF THE PROSTATE (TURP);  Surgeon: [name], MD;  Location: [facility];  Service: Urology;  Laterality: N/A;    Denies any headaches, dizziness, double vision, fevers, chills, night sweats, nausea, vomiting, constipation, chest pain, heart palpitations, shortness of breath, blood in stool, black tarry stool, urinary pain, urinary burning, urinary frequency, hematuria. Positive for diarrhea.   14 point review of systems was performed and is negative except as detailed  under history of present illness and above    PHYSICAL EXAMINATION  ECOG PERFORMANCE STATUS: 1 - Symptomatic but completely ambulatory  Filed Vitals:   12/14/14 1022  BP: 96/66  Pulse: 83  Temp: 98.5 F (36.9 C)  Resp: 18    GENERAL:alert, no distress, comfortable, cooperative and chronically ill appearing, accompanied by wife and son. Walks to the exam table without assistance SKIN: skin color, texture, turgor are normal, no rashes or significant lesions HEAD: Normocephalic, No masses, lesions, tenderness or abnormalities EYES: normal, PERRLA EARS: External ears normal OROPHARYNX:lips, buccal mucosa, and tongue normal and mucous membranes are moist  NECK: supple, trachea midline LYMPH: no palpable adenopathy in the neck or supraclavicular regions BREAST:not examined LUNGS: decreased breath sounds HEART: regular rate & rhythm, no murmurs and no gallops ABDOMEN:abdomen soft and normal bowel sounds, wearing adult depends BACK: Back symmetric, no curvature. EXTREMITIES:less then 2 second capillary refill, no joint deformities, effusion, or inflammation, no skin discoloration, no cyanosis  NEURO: alert & oriented x 3 with fluent speech, no focal motor/sensory deficits   RADIOLOGY: I have reviewed the radiology images listed below and agree with the results  Study Result     CLINICAL DATA: History of bladder cancer and lung  cancer. Pain with bowel movement.  EXAM: MRI PELVIS WITHOUT AND WITH CONTRAST  TECHNIQUE: Multiplanar multisequence MR imaging of the pelvis was performed both before and after administration of intravenous contrast.  CONTRAST: 11mL MULTIHANCE GADOBENATE DIMEGLUMINE 529 MG/ML IV SOLN  COMPARISON: 08/30/2014  FINDINGS: Urinary Tract: Postoperative change from TURP noted. Diffuse bladder wall thickening is again noted. No discrete bladder mass identified.  Bowel: Mild wall thickening involving the sigmoid colon and rectum. No discrete  mass identified  Vascular/Lymphatic: The pelvic vasculature is intact. No pelvic or inguinal adenopathy.  Reproductive: Prostate gland appears enlarged. There is a defect within the prostate gland compatible with TU are P. The gland measures 3.4 x 2.4 x .4 cm.  Other: No free fluid identified within the pelvis. No peritoneal nodule noted.  Musculoskeletal: No abnormal signal within the bone marrow to suggest osseous metastasis.  IMPRESSION: 1. No evidence for metastatic disease. 2. Diffuse bladder wall thickening. No focal mass identified.   Electronically Signed  By: Taylor Stroud M.D.  On: 12/06/2014 14:24     LABORATORY DATA: I have reviewed the laboratory studies listed below  CBC    Component Value Date/Time   WBC 6.1 11/30/2014 1215   RBC 3.63* 11/30/2014 1215   HGB 11.8* 11/30/2014 1215   HCT 35.4* 11/30/2014 1215   PLT 169 11/30/2014 1215   MCV 97.5 11/30/2014 1215   MCH 32.5 11/30/2014 1215   MCHC 33.3 11/30/2014 1215   RDW 15.0 11/30/2014 1215   LYMPHSABS 1.3 11/30/2014 1215   MONOABS 0.8 11/30/2014 1215   EOSABS 0.2 11/30/2014 1215   BASOSABS 0.0 11/30/2014 1215    CMP     Component Value Date/Time   NA 139 11/30/2014 1215   K 3.5 11/30/2014 1215   CL 103 11/30/2014 1215   CO2 29 11/30/2014 1215   GLUCOSE 111* 11/30/2014 1215   BUN 10 11/30/2014 1215   CREATININE 0.86 11/30/2014 1215   CALCIUM 8.9 11/30/2014 1215   PROT 6.2* 11/30/2014 1215   ALBUMIN 3.1* 11/30/2014 1215   AST 18 11/30/2014 1215   ALT 8* 11/30/2014 1215   ALKPHOS 72 11/30/2014 1215   BILITOT 0.5 11/30/2014 1215   GFRNONAA >60 11/30/2014 1215   GFRAA >60 11/30/2014 1215     ASSESSMENT AND PLAN:   Stage I non-small cell lung cancer  Tolerated Nivolumab without difficulty. Repeat CT of the chest next week, regroup and if disease is stable to improved would restart therapy.  Depression I believe this is a significant component of many of his problems. He  is finally taking his trazodone as prescribed and is sleeping better. He is taking Remeron regularly.He had an appointment with behavioral health but did not go.  He continues on trazodone and remeron. His Trazodone was refilled at his last visit.   Pelvic pain Chronic, but IMPROVED on fentanyl. He has been very reluctant to take any pain medication but is finally doing well on the fentanyl patch. I have written for 25 mcg patches. If needed we can increase again at his next f/u.   Bladder cancer  He has not had follow-up with [facility]. At this point I am not sure of the benefit. His bladder cancer was not seen on imaging studies, clinically he does not appear to have progressive disease. Findings on MRI have been reviewed with the patient and family.    Declining PS Again readdressed goals of care. We discussed discontinuing care given his PS. Family and the patient now feel with   his pain improving he is doing better. They have declined additional palliative care intervention at this time. .  This document serves as a record of services personally performed by Ancil Linsey, MD. It was created on her behalf by Toni Amend, a trained medical scribe. The creation of this record is based on the scribe's personal observations and the provider's statements to them. This document has been checked and approved by the attending provider.  I have reviewed the above documentation for accuracy and completeness, and I agree with the above.  This note was electronically signed.  Kelby Fam. Whitney Muse, MD

## 2014-12-14 NOTE — Patient Instructions (Signed)
[place] at [facility] Discharge Instructions  RECOMMENDATIONS MADE BY THE CONSULTANT AND ANY TEST RESULTS WILL BE SENT TO YOUR REFERRING PHYSICIAN.  Exam and discussion by Dr. Whitney Muse Prescription for Fentanyl 25 mcg - use 2 patch every 3 days. Continue oxycodone as needed CT of Chest next week  Call with any concerns.  Follow-up on 11/11 to discuss results and treatment plans.  Thank you for choosing [place] at [facility] to provide your oncology and hematology care.  To afford each patient quality time with our provider, please arrive at least 15 minutes before your scheduled appointment time.    You need to re-schedule your appointment should you arrive 10 or more minutes late.  We strive to give you quality time with our providers, and arriving late affects you and other patients whose appointments are after yours.  Also, if you no show three or more times for appointments you may be dismissed from the clinic at the providers discretion.     Again, thank you for choosing [facility].  Our hope is that these requests will decrease the amount of time that you wait before being seen by our physicians.       _____________________________________________________________  Should you have questions after your visit to [facility], please contact our office at [phone] between the hours of 8:30 a.m. and 4:30 p.m.  Voicemails left after 4:30 p.m. will not be returned until the following business day.  For prescription refill requests, have your pharmacy contact our office.

## 2014-12-19 ENCOUNTER — Ambulatory Visit (HOSPITAL_COMMUNITY)
Admission: RE | Admit: 2014-12-19 | Discharge: 2014-12-19 | Disposition: A | Discharge status: Home / Self Care | Payer: Medicare Other | Source: Ambulatory Visit | Attending: Hematology & Oncology | Admitting: Hematology & Oncology

## 2014-12-19 DIAGNOSIS — C3492 Malignant neoplasm of unspecified part of left bronchus or lung: Secondary | ICD-10-CM | POA: Diagnosis not present

## 2014-12-19 DIAGNOSIS — C679 Malignant neoplasm of bladder, unspecified: Secondary | ICD-10-CM

## 2014-12-19 MED ORDER — IOHEXOL 300 MG/ML  SOLN
75.0000 mL | Freq: Once | INTRAMUSCULAR | Status: AC | PRN
  Administered 2014-12-19: 75 mL via INTRAVENOUS

## 2014-12-21 ENCOUNTER — Encounter (HOSPITAL_BASED_OUTPATIENT_CLINIC_OR_DEPARTMENT_OTHER): Payer: Medicare Other | Admitting: Oncology

## 2014-12-21 ENCOUNTER — Encounter (HOSPITAL_COMMUNITY): Payer: Self-pay | Admitting: Oncology

## 2014-12-21 ENCOUNTER — Encounter (HOSPITAL_COMMUNITY): Payer: Medicare Other

## 2014-12-21 ENCOUNTER — Telehealth (HOSPITAL_COMMUNITY): Payer: Self-pay

## 2014-12-21 VITALS — BP 108/61 | HR 65 | Temp 99.4°F | Resp 15 | Wt 122.8 lb

## 2014-12-21 DIAGNOSIS — R309 Painful micturition, unspecified: Secondary | ICD-10-CM

## 2014-12-21 DIAGNOSIS — C3490 Malignant neoplasm of unspecified part of unspecified bronchus or lung: Secondary | ICD-10-CM

## 2014-12-21 DIAGNOSIS — R5383 Other fatigue: Secondary | ICD-10-CM

## 2014-12-21 DIAGNOSIS — J90 Pleural effusion, not elsewhere classified: Secondary | ICD-10-CM

## 2014-12-21 DIAGNOSIS — C679 Malignant neoplasm of bladder, unspecified: Secondary | ICD-10-CM

## 2014-12-21 DIAGNOSIS — R319 Hematuria, unspecified: Secondary | ICD-10-CM | POA: Diagnosis not present

## 2014-12-21 DIAGNOSIS — C7982 Secondary malignant neoplasm of genital organs: Secondary | ICD-10-CM | POA: Diagnosis not present

## 2014-12-21 DIAGNOSIS — R52 Pain, unspecified: Secondary | ICD-10-CM

## 2014-12-21 DIAGNOSIS — C3492 Malignant neoplasm of unspecified part of left bronchus or lung: Secondary | ICD-10-CM

## 2014-12-21 DIAGNOSIS — Z720 Tobacco use: Secondary | ICD-10-CM | POA: Diagnosis not present

## 2014-12-21 DIAGNOSIS — R39198 Other difficulties with micturition: Secondary | ICD-10-CM

## 2014-12-21 DIAGNOSIS — E032 Hypothyroidism due to medicaments and other exogenous substances: Secondary | ICD-10-CM

## 2014-12-21 DIAGNOSIS — R3911 Hesitancy of micturition: Secondary | ICD-10-CM

## 2014-12-21 LAB — COMPREHENSIVE METABOLIC PANEL
ALBUMIN: 3.4 g/dL — AB (ref 3.5–5.0)
ALK PHOS: 79 U/L (ref 38–126)
ALT: 9 U/L — AB (ref 17–63)
AST: 16 U/L (ref 15–41)
Anion gap: 6 (ref 5–15)
BUN: 8 mg/dL (ref 6–20)
CALCIUM: 9.3 mg/dL (ref 8.9–10.3)
CHLORIDE: 101 mmol/L (ref 101–111)
CO2: 32 mmol/L (ref 22–32)
CREATININE: 0.82 mg/dL (ref 0.61–1.24)
GFR calc Af Amer: >60 mL/min (ref >60)
GFR calc non Af Amer: >60 mL/min (ref >60)
GLUCOSE: 124 mg/dL — AB (ref 65–99)
Potassium: 3.3 mmol/L — ABNORMAL LOW (ref 3.5–5.1)
SODIUM: 139 mmol/L (ref 135–145)
Total Bilirubin: 0.4 mg/dL (ref 0.3–1.2)
Total Protein: 7 g/dL (ref 6.5–8.1)

## 2014-12-21 LAB — CBC WITH DIFFERENTIAL/PLATELET
BASOS ABS: 0 10*3/uL (ref 0.0–0.1)
BASOS PCT: 1 %
EOS ABS: 0.1 10*3/uL (ref 0.0–0.7)
Eosinophils Relative: 2 %
HCT: 41.9 % (ref 39.0–52.0)
HEMOGLOBIN: 14 g/dL (ref 13.0–17.0)
LYMPHS ABS: 1.1 10*3/uL (ref 0.7–4.0)
Lymphocytes Relative: 17 %
MCH: 33.1 pg (ref 26.0–34.0)
MCHC: 33.4 g/dL (ref 30.0–36.0)
MCV: 99.1 fL (ref 78.0–100.0)
Monocytes Absolute: 0.6 10*3/uL (ref 0.1–1.0)
Monocytes Relative: 9 %
NEUTROS PCT: 71 %
Neutro Abs: 4.6 10*3/uL (ref 1.7–7.7)
Platelets: 218 10*3/uL (ref 150–400)
RBC: 4.23 MIL/uL (ref 4.22–5.81)
RDW: 15.5 % (ref 11.5–15.5)
WBC: 6.5 10*3/uL (ref 4.0–10.5)

## 2014-12-21 LAB — TSH: TSH: 29.246 u[IU]/mL — ABNORMAL HIGH (ref 0.350–4.500)

## 2014-12-21 MED ORDER — LEVOTHYROXINE SODIUM 50 MCG PO TABS: 50.0000 ug | ORAL_TABLET | Freq: Every day | ORAL | Status: AC

## 2014-12-21 MED ORDER — LEVOTHROID 25 MCG PO TABS: 25.0000 ug | ORAL_TABLET | Freq: Every day | ORAL | Status: DC

## 2014-12-21 MED ORDER — LEVOFLOXACIN 500 MG PO TABS: 500.0000 mg | ORAL_TABLET | Freq: Every day | ORAL | Status: AC

## 2014-12-21 NOTE — Telephone Encounter (Signed)
Call from wife stating that Brian Sims may not be able to come in for his appointment due to some bowel issues.  Has been using stool softeners and Phillips Stool softener with laxative.  Because he was coming for appointment today he also took an imodium to keep his bowels from being active while he's in for an office visit.  Wants to discuss test results and also wants to know if there is a different type of thyroid medication that he could take because they felt that what he was taking was causing dizziness and nausea.  Instructed to come for the appointment if possible and that if he does not make it we would make other arrangements.

## 2014-12-21 NOTE — Progress Notes (Signed)
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  INTERVAL HISTORY: Brian Sims 64 y.o. male returns for followup of squamous cell carcinoma of lung, S/P XRT by Dr. [name] with progression of disease in July 2016 on CT imaging resulting in the start of salvage Nivolumab on 09/21/2014.  Repeat imaging to evaluate response to therapy following 6 treatments on 12/19/2014 demonstrates progression of disease. AND High-grade urothelial carcinoma with prostate involvement, S/P carboplatin/gemzar 02/14/2014- 05/03/2014 followed by carboplatin weekly + XRT finishing on 05/31/2014.     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   10/25/2013 Pathology Results Diagnosis: TRANSBRONCHIAL NEEDLE ASPIRATION NAVIGATION #1, LEFT LOWER LOBE. MALIGNANT CELLS PRESENT, CONSISTENT WITH NON-SMALL CELL CARCINOMA. THE FEATURES ARE SUGGESTIVE OF SQUAMOUS CELL CARCINOMA.   10/25/2013 Initial Diagnosis T1N0 SCCA of the left lower lobe    Radiation Therapy    05/17/2014 Imaging CT chest- Persistent but improved appearance of left lower lobe lung mass. Small pulmonary nodules in the right lung are unchanged. No new or progressive disease identified.   08/30/2014 Progression CT chest- Significant enlargement of left lower lobe mass,   09/21/2014 - 11/30/2014 Chemotherapy Nivolumab every 2 weeks.  Received 6 cycles of treatment.   10/22/2014 Pathology Results  PD-L1 negative   12/19/2014 Progression CT chest- Interval progression of the left lower lobe mass with associated increase in left pleural effusion.    Bladder cancer ([place])   01/18/2014 Initial Diagnosis 1. Bladder, biopsy, right lateral wall of bladder - UROTHELIAL CARCINOMA IN SITU, NO STROMAL INVASION IDENTIFIED.  3. Prostate, TUR, deep prostatic tissue - HIGH GRADE UROTHELIAL CARCINOMA WITH ASSOCIATED TUMOR NECROSIS, INVOLVING PROSTATIC DUCTS    02/14/2014 - 05/03/2014 Chemotherapy Carboplatin/Gemzar   05/09/2014 - 06/28/2014 Radiation Therapy Pelvis and bladder boost by Dr. [name].   05/11/2014 Treatment Plan Change Change chemotherapy to weekly Carboplatin (AUC 2)   05/11/2014 - 05/31/2014 Chemotherapy Weekly Carboplatin with XRT at  AUC 2.   08/30/2014 Remission Ct abd.pelvis- Diffuse thickening of the urinary bladder, without a clearly identifiable bladder wall mass.   12/06/2014 Imaging MRI pelvis- No evidence for metastatic disease. Diffuse bladder wall thickening. No focal mass identified.    I personally reviewed and went over radiographic studies with the patient.  The results are noted within this dictation.  CT of chest demonstrates progression of disease, indicative of treatment failure.   We have discussed the scenario in the past on many occassions given his incurable disease.  The patient and his family have been very resistant to these conversations and have failed to establish CODE STATUS.  They have met on a few occassions with palliative medicine.  I broached the topic of hospice.  We had a long conversation regarding Hospice.  Patient education was given regarding Hospice and the services they provide.  The patient understands that studies report that early enrollment in Hospice actually allows the patient to live longer compared to those who enroll in Hospice nearer to end of life.  Hospice will  allow the patient to stay at home at end of life or go to a facility for end of life  care.  At this point, the patient would like to remain at home.  Hospice provides the patient with a team of providers to help with care including physicians, nurses, aids, chaplains, and social workers.  Hospice's goal is to control symptoms with medications without life prolonging interventions.  The patient is certainly Hospice appropriate with a life expectancy of less than 6 months.  The patient is not a candidate for active chemotherapy.   The patient's wife wonders if brand name synthroid will help better with the patient's symptoms, namely fatigue.  She reports that she thinks his fatigue progressed when levothyroxine was started.  Most likely, his symptoms are malignancy-induced.  She is not willing accept this at this time.  He reports some urinary hesitancy and urinary burning.    Past Medical History  Diagnosis Date  . COPD (chronic obstructive pulmonary disease) ([place])   . Inflammatory bowel disease   . Constipation   . Lesion of bladder   . Prostate mass   . Dysuria-frequency syndrome     wears depends  . Arthritis   . Shortness of breath   . Depression   . Anxiety   . Kidney stones   . GERD (gastroesophageal reflux disease)   . Cancer ([place]) 2015    LT lung, bladder and prostate    has IBS (irritable bowel syndrome); Back pain; Heme positive stool; Squamous cell carcinoma of left lung ([place]); Bladder cancer ([place]); Tobacco abuse; Hematuria; Neoplasm related pain; and Hypothyroidism due to medication on his problem list.     is allergic to lyrica; gabapentin; and morphine and related.  Current Outpatient Prescriptions on File Prior to Visit  Medication Sig Dispense Refill  . ALPRAZolam (XANAX) 1 MG tablet Take 0.5 mg by mouth every 2 (two) hours.     Marland Kitchen buPROPion (WELLBUTRIN XL) 150 MG 24 hr tablet Take 2 tablets (300 mg total) by mouth daily. 60 tablet 2  . Cholecalciferol 5000 UNITS capsule Take 1 capsule (5,000 Units total) by mouth daily. 30 capsule 4  . docusate  sodium (COLACE) 100 MG capsule Take 200 mg by mouth at bedtime.    . fentaNYL (DURAGESIC - DOSED MCG/HR) 25 MCG/HR patch Place 1 patch (25 mcg total) onto the skin every 3 (three) days. 10 patch 0  . hydrocortisone (PROCTOZONE-HC) 2.5 % rectal cream Place 1 application rectally 3 (three) times daily as needed for hemorrhoids (swelling).    Marland Kitchen lidocaine-prilocaine (EMLA) cream Apply 1 application topically as needed. 30 g 6  . Meth-Hyo-M Bl-Na Phos-Ph Sal (URIBEL) 118 MG CAPS Take 118 mg by mouth 2 (two) times daily.     . mirtazapine (REMERON) 15 MG tablet TAKE (1) TABLET BY MOUTH AT BEDTIME WITH 45 MG TABLET FOR A TOTAL DOSE OF 60 MG. 30 tablet 0  . mirtazapine (REMERON) 45 MG tablet Take 45 mg by mouth at bedtime.   11  . Multiple Vitamins-Minerals (MULTIVITAMIN WITH MINERALS) tablet Take 1 tablet by mouth every morning. [employer] Multi Vitamin    . omeprazole (PRILOSEC) 20 MG capsule Take 1 capsule (20 mg total) by mouth 2 (two) times daily before a meal. 60 capsule 3  . ondansetron (ZOFRAN) 8 MG tablet Take 1 tablet (8 mg total) by mouth every 8 (eight) hours as needed for nausea or vomiting. 30 tablet 3  . oxyCODONE-acetaminophen (PERCOCET) 10-325 MG per tablet Take  1-2 tablets by mouth every 4 (four) hours as needed for pain. 150 tablet 0  . potassium chloride SA (K-DUR,KLOR-CON) 20 MEQ tablet Take 1 tablet twice a day x 15 days. 30 tablet 1  . prochlorperazine (COMPAZINE) 10 MG tablet TAKE (1) TABLET BY MOUTH EVERY SIX HOURS AS NEEDED NAUSEA AND VOMITING. 30 tablet 0  . Propylene Glycol (SYSTANE BALANCE OP) Place 1 drop into both eyes daily.    . tamsulosin (FLOMAX) 0.4 MG CAPS capsule Take 1 capsule (0.4 mg total) by mouth daily. 30 capsule 3  . traZODone (DESYREL) 150 MG tablet Take 2 1/2 to three tablets at night for sleep and depression 90 tablet 3  . vitamin C (ASCORBIC ACID) 500 MG tablet Take 500 mg by mouth daily.    Marland Kitchen Dextromethorphan Polistirex (DELSYM PO) Take 15 mLs by mouth as  needed.    . lubiprostone (AMITIZA) 8 MCG capsule Start with one twice daily by mouth, may increase up to 3 twice daily if needed to help with constipation 60 capsule 1  . magnesium hydroxide (MILK OF MAGNESIA) 400 MG/5ML suspension Take 30 mLs by mouth daily as needed for mild constipation.    . Nivolumab (OPDIVO IV) Inject into the vein every 14 (fourteen) days.    . NON FORMULARY Take 250 mg by mouth daily. Grape concentrate -used as antioxident    . predniSONE (DELTASONE) 10 MG tablet Take 50 mg PO daily for side effects of Opdivo as reviewed in chemotherapy teaching. (Patient not taking: Reported on 12/14/2014) 120 tablet 0  . senna (SENOKOT) 8.6 MG TABS tablet Take 1 tablet by mouth 3 (three) times daily as needed for mild constipation.    Marland Kitchen tiotropium (SPIRIVA) 18 MCG inhalation capsule Place 18 mcg into inhaler and inhale every evening.      No current facility-administered medications on file prior to visit.    Past Surgical History  Procedure Laterality Date  . Back surgery    . Left shoulder      arthroscopy  . Rt knee arthroscopy    . Colonoscopy N/A 01/11/2013    Procedure: COLONOSCOPY;  Surgeon: [name], MD;  [facility];  Service: Endoscopy;  Laterality: N/A;  340  . Carpal tunnel release Right   . Transurethral resection of bladder tumor N/A 09/21/2013    Procedure: TRANSURETHRAL RESECTION  PROSTATE  AND BLADDER ;  Surgeon: [name], MD;  Location: [facility];  Service: Urology;  Laterality: N/A;  . Video bronchoscopy with endobronchial navigation Right 10/25/2013    Procedure: VIDEO BRONCHOSCOPY WITH ENDOBRONCHIAL NAVIGATION;  Surgeon: [name], MD;  Location: [facility];  Service: Thoracic;  Laterality: Right;  . Transurethral resection of bladder tumor with gyrus (turbt-gyrus) N/A 01/18/2014    Procedure: TRANSURETHRAL RESECTION OF BLADDER TUMOR WITH GYRUS (TURBT-GYRUS);  Surgeon: [name], MD;  Location: [facility];  Service:  Urology;  Laterality: N/A;  . Transurethral resection of prostate N/A 01/18/2014    Procedure: TRANSURETHRAL RESECTION OF THE PROSTATE (TURP);  Surgeon: [name], MD;  Location: [facility];  Service: Urology;  Laterality: N/A;    Denies any headaches, dizziness, double vision, fevers, chills, night sweats, nausea, vomiting, diarrhea, constipation, chest pain, heart palpitations, shortness of breath, blood in stool, black tarry stool, urinary pain, urinary burning, urinary frequency, hematuria.   PHYSICAL EXAMINATION  ECOG PERFORMANCE STATUS: 3 - Symptomatic, >50% confined to bed  Filed Vitals:   12/21/14 1100  BP: 108/61  Pulse: 65  Temp: 99.4 F (37.4 C)  Resp: 15    GENERAL:alert, comfortable, cooperative and in wheelchair, chronically ill appearing, accmpanied by his wife and son. SKIN: skin color, texture, turgor are normal, no rashes or significant lesions HEAD: Atraumatic normocephalic EYES: normal EARS: External ears normal OROPHARYNX:mucous membranes are moist  NECK: supple, trachea midline LYMPH:  not examined BREAST:not examined LUNGS: not examined HEART: not examined ABDOMEN:not examined BACK: Back symmetric, no curvature. EXTREMITIES:less then 2 second capillary refill, no joint deformities, effusion, or inflammation, no cyanosis  NEURO: alert & oriented x 3 with fluent speech, no focal motor/sensory deficits, in a wheelchair   LABORATORY DATA: CBC    Component Value Date/Time   WBC 6.5 12/21/2014 1110   RBC 4.23 12/21/2014 1110   HGB 14.0 12/21/2014 1110   HCT 41.9 12/21/2014 1110   PLT 218 12/21/2014 1110   MCV 99.1 12/21/2014 1110   MCH 33.1 12/21/2014 1110   MCHC 33.4 12/21/2014 1110   RDW 15.5 12/21/2014 1110   LYMPHSABS 1.1 12/21/2014 1110   MONOABS 0.6 12/21/2014 1110   EOSABS 0.1 12/21/2014 1110   BASOSABS 0.0 12/21/2014 1110      Chemistry      Component Value Date/Time   NA 139 12/21/2014 1110   K 3.3* 12/21/2014 1110   CL 101  12/21/2014 1110   CO2 32 12/21/2014 1110   BUN 8 12/21/2014 1110   CREATININE 0.82 12/21/2014 1110      Component Value Date/Time   CALCIUM 9.3 12/21/2014 1110   ALKPHOS 79 12/21/2014 1110   AST 16 12/21/2014 1110   ALT 9* 12/21/2014 1110   BILITOT 0.4 12/21/2014 1110        PENDING LABS:   RADIOGRAPHIC STUDIES:  Ct Chest W Contrast  12/19/2014  CLINICAL DATA:  Subsequent encounter for lung cancer EXAM: CT CHEST WITH CONTRAST TECHNIQUE: Multidetector CT imaging of the chest was performed during intravenous contrast administration. CONTRAST:  63m OMNIPAQUE IOHEXOL 300 MG/ML  SOLN COMPARISON:  08/30/2014. FINDINGS: Mediastinum / Lymph Nodes: The tip of the right Port-A-Cath is positioned at the SVC/ RA junction. There is no axillary lymphadenopathy. No mediastinal lymphadenopathy. 8 mm short axis AP window lymph node is unchanged. There is no hilar lymphadenopathy. Circumferential wall thickening in the distal esophagus is stable. Lungs / Pleura: The left lower lobe mixed attenuation lesion measures 7.2 x 5.4 cm and these measure and may incorporate some adjacent drowned lung. Lesion previously measured 5.5 x 4.8 cm. Associated left pleural effusion has progressed slightly in the interval. Upper Abdomen: Stable appearance staghorn calculus left kidney. Thickening of the adrenal glands noted without a discrete nodule or mass. MSK / Soft Tissues: Bone windows reveal no worrisome lytic or sclerotic osseous lesions. IMPRESSION: 1. Interval progression of the left lower lobe mass with associated increase in left pleural effusion. 2. Circumferential wall thickening in the distal esophagus. Esophagitis would be a consideration. Electronically Signed   By: EMisty StanleyM.D.   On: 12/19/2014 16:33   Mr Pelvis W Wo Contrast  12/06/2014  CLINICAL DATA:  History of bladder cancer and lung cancer. Pain with bowel movement. EXAM: MRI PELVIS WITHOUT AND WITH CONTRAST TECHNIQUE: Multiplanar multisequence  MR imaging of the pelvis was performed both before and after administration of intravenous contrast. CONTRAST:  169mMULTIHANCE GADOBENATE DIMEGLUMINE 529 MG/ML IV SOLN COMPARISON:  08/30/2014 FINDINGS: Urinary Tract: Postoperative change from TURP noted. Diffuse bladder wall thickening is again noted. No discrete bladder mass identified. Bowel:  Mild wall thickening involving the sigmoid colon and rectum. No discrete mass identified Vascular/Lymphatic: The pelvic vasculature is intact. No pelvic or inguinal adenopathy. Reproductive: Prostate gland appears enlarged. There is a defect within the prostate gland compatible with TU are P. The gland measures 3.4 x 2.4 x .4 cm. Other: No free fluid identified within the pelvis. No peritoneal nodule noted. Musculoskeletal: No abnormal signal within the bone marrow to suggest osseous metastasis. IMPRESSION: 1. No evidence for metastatic disease. 2. Diffuse bladder wall thickening.  No focal mass identified. Electronically Signed   By: Kerby Moors M.D.   On: 12/06/2014 14:24     PATHOLOGY:    ASSESSMENT AND PLAN:  Squamous cell carcinoma of left lung Squamous cell carcinoma of lung, S/P XRT by Dr. [name] with progression of disease in July 2016 on CT imaging resulting in the start of salvage Nivolumab on 09/21/2014.  Repeat imaging to evaluate response to therapy following 6 treatments on 12/19/2014 demonstrates progression of disease.  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  Oncology history is updated.  Updated staging of lung cancer is completed in problem list.  With progressive disease, Nivolumab will be discontinued.  Antibody plan is deleted and episode of care is resolved.  We discussed next best intervention which is a DNR and referral to Hospice at home to improve management of symptoms, namely pain.  rx for Synthroid 50 mcg escribed.  TSH is elevated and therefore, thyroid replacement  was increased to 50 mcg.  Recommend HOSPICE referral.  The patient's wife needs time to consider this option and absorb all of today's information.    Return PRN.  Will refer the patient to Hospice when approval is provided by patient/family.     THERAPY PLAN:  Comfort measures only.  Recommend hospice referral.  All questions were answered. The patient knows to call the clinic with any problems, questions or concerns. We can certainly see the patient much sooner if necessary.  Patient and plan discussed with Dr. Ancil Linsey and she is in agreement with the aforementioned.   This note is electronically signed by: Doy Mince 12/21/2014 9:32 PM

## 2014-12-21 NOTE — Telephone Encounter (Signed)
Spoke with Hoyle Sauer and she will pick up the antibiotic and the increased dosage of thyroid medication tomorrow from [facility].

## 2014-12-21 NOTE — Telephone Encounter (Signed)
-----   Message from [facility], [place] sent at 12/21/2014  3:59 PM EST ----- Increase Brian Sims's Thyroid medication to 50 mcg daily (compared to 25 mcg).  TSH is elevated.  TK

## 2014-12-21 NOTE — Assessment & Plan Note (Addendum)
Squamous cell carcinoma of lung, S/P XRT by Dr. [name] with progression of disease in July 2016 on CT imaging resulting in the start of salvage Nivolumab on 09/21/2014.  Repeat imaging to evaluate response to therapy following 6 treatments on 12/19/2014 demonstrates progression of disease.  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  Oncology history is updated.  Updated staging of lung cancer is completed in problem list.  With progressive disease, Nivolumab will be discontinued.  Antibody plan is deleted and episode of care is resolved.  We discussed next best intervention which is a DNR and referral to Hospice at home to improve management of symptoms, namely pain.  rx for Synthroid 50 mcg escribed.  TSH is elevated and therefore, thyroid replacement was increased to 50 mcg.  Recommend HOSPICE referral.  The patient's wife needs time to consider this option and absorb all of today's information.    Return PRN.  Will refer the patient to Hospice when approval is provided by patient/family.

## 2014-12-21 NOTE — Patient Instructions (Signed)
[place] at [facility] Discharge Instructions  RECOMMENDATIONS MADE BY THE CONSULTANT AND ANY TEST RESULTS WILL BE SENT TO YOUR REFERRING PHYSICIAN.  All future appointments cancelled. Call to schedule any future appointments if needed.  Thank you for choosing [place] at [facility] to provide your oncology and hematology care.  To afford each patient quality time with our provider, please arrive at least 15 minutes before your scheduled appointment time.    You need to re-schedule your appointment should you arrive 10 or more minutes late.  We strive to give you quality time with our providers, and arriving late affects you and other patients whose appointments are after yours.  Also, if you no show three or more times for appointments you may be dismissed from the clinic at the providers discretion.     Again, thank you for choosing [facility].  Our hope is that these requests will decrease the amount of time that you wait before being seen by our physicians.       _____________________________________________________________  Should you have questions after your visit to [facility], please contact our office at [phone] between the hours of 8:30 a.m. and 4:30 p.m.  Voicemails left after 4:30 p.m. will not be returned until the following business day.  For prescription refill requests, have your pharmacy contact our office.

## 2014-12-25 ENCOUNTER — Encounter (INDEPENDENT_AMBULATORY_CARE_PROVIDER_SITE_OTHER): Payer: Self-pay | Admitting: *Deleted

## 2014-12-26 ENCOUNTER — Other Ambulatory Visit (HOSPITAL_COMMUNITY): Payer: Self-pay | Admitting: Hematology & Oncology

## 2015-01-07 ENCOUNTER — Other Ambulatory Visit (HOSPITAL_COMMUNITY): Payer: Self-pay | Admitting: *Deleted

## 2015-01-07 DIAGNOSIS — C3492 Malignant neoplasm of unspecified part of left bronchus or lung: Secondary | ICD-10-CM

## 2015-01-07 MED ORDER — TIOTROPIUM BROMIDE MONOHYDRATE 18 MCG IN CAPS: 18.0000 ug | ORAL_CAPSULE | Freq: Every evening | RESPIRATORY_TRACT | Status: AC

## 2015-01-07 MED ORDER — TIOTROPIUM BROMIDE MONOHYDRATE 18 MCG IN CAPS: 18.0000 ug | ORAL_CAPSULE | Freq: Every day | RESPIRATORY_TRACT | Status: DC

## 2015-01-09 ENCOUNTER — Encounter (HOSPITAL_BASED_OUTPATIENT_CLINIC_OR_DEPARTMENT_OTHER): Payer: Medicare Other

## 2015-01-09 DIAGNOSIS — C3492 Malignant neoplasm of unspecified part of left bronchus or lung: Secondary | ICD-10-CM

## 2015-01-09 DIAGNOSIS — C679 Malignant neoplasm of bladder, unspecified: Secondary | ICD-10-CM | POA: Diagnosis not present

## 2015-01-09 LAB — CBC
HEMATOCRIT: 39.2 % (ref 39.0–52.0)
HEMOGLOBIN: 12.7 g/dL — AB (ref 13.0–17.0)
MCH: 31.9 pg (ref 26.0–34.0)
MCHC: 32.4 g/dL (ref 30.0–36.0)
MCV: 98.5 fL (ref 78.0–100.0)
Platelets: 238 10*3/uL (ref 150–400)
RBC: 3.98 MIL/uL — AB (ref 4.22–5.81)
RDW: 14.4 % (ref 11.5–15.5)
WBC: 5.7 10*3/uL (ref 4.0–10.5)

## 2015-01-09 LAB — COMPREHENSIVE METABOLIC PANEL
ALK PHOS: 89 U/L (ref 38–126)
ALT: 12 U/L — AB (ref 17–63)
ANION GAP: 4 — AB (ref 5–15)
AST: 18 U/L (ref 15–41)
Albumin: 3.2 g/dL — ABNORMAL LOW (ref 3.5–5.0)
BILIRUBIN TOTAL: 0.2 mg/dL — AB (ref 0.3–1.2)
BUN: 11 mg/dL (ref 6–20)
CALCIUM: 9.3 mg/dL (ref 8.9–10.3)
CO2: 32 mmol/L (ref 22–32)
CREATININE: 0.86 mg/dL (ref 0.61–1.24)
Chloride: 101 mmol/L (ref 101–111)
GFR calc non Af Amer: >60 mL/min (ref >60)
GLUCOSE: 98 mg/dL (ref 65–99)
Potassium: 3.6 mmol/L (ref 3.5–5.1)
SODIUM: 137 mmol/L (ref 135–145)
TOTAL PROTEIN: 6.9 g/dL (ref 6.5–8.1)

## 2015-01-10 ENCOUNTER — Telehealth (HOSPITAL_COMMUNITY): Payer: Self-pay

## 2015-01-10 NOTE — Telephone Encounter (Signed)
Really wish his wife and the patient would allow hospice into the home. They can help with many of the problems he is having!. Dr.P

## 2015-01-10 NOTE — Telephone Encounter (Signed)
Call from wife and wants to know if there is anything that can be done to improve Mr. Kallam symptoms.  Has dizziness when he stands, is very weak and sometimes he's confused.  Confusion has improved a little with decreasing his Fentanyl patches to 12.5 mcg.  Just wants to know if there is anything else to do.

## 2015-01-10 NOTE — Telephone Encounter (Signed)
Spoke with Hoyle Sauer and let her know that Hospice will not only benefit Aleksei but her as well.  Discussed the value of Hospice helping with providing care and equipment when needed so she could enjoy the time they have left together.  States that she will think about it and call me back either tomorrow or next week.

## 2015-01-17 ENCOUNTER — Other Ambulatory Visit (HOSPITAL_COMMUNITY): Payer: Self-pay | Admitting: Oncology

## 2015-01-23 ENCOUNTER — Other Ambulatory Visit (HOSPITAL_COMMUNITY): Payer: Self-pay | Admitting: Oncology

## 2015-02-07 ENCOUNTER — Telehealth (HOSPITAL_COMMUNITY): Payer: Self-pay | Admitting: Hematology & Oncology

## 2015-02-07 NOTE — Telephone Encounter (Signed)
[facility] WAS DENIED INITIALLY  AND THE APPEAL AS WELL   BY [employer]  AROUND 12/04/14.

## 2015-02-19 ENCOUNTER — Other Ambulatory Visit (HOSPITAL_COMMUNITY): Payer: Self-pay | Admitting: Oncology

## 2015-02-25 ENCOUNTER — Other Ambulatory Visit (HOSPITAL_COMMUNITY): Payer: Self-pay | Admitting: *Deleted

## 2015-02-25 DIAGNOSIS — C3492 Malignant neoplasm of unspecified part of left bronchus or lung: Secondary | ICD-10-CM

## 2015-02-25 MED ORDER — OXYCODONE-ACETAMINOPHEN 10-325 MG PO TABS: 1.0000 | ORAL_TABLET | ORAL | Status: AC | PRN

## 2015-03-05 ENCOUNTER — Other Ambulatory Visit (HOSPITAL_COMMUNITY): Payer: Self-pay | Admitting: Oncology

## 2015-03-14 ENCOUNTER — Other Ambulatory Visit (HOSPITAL_COMMUNITY): Payer: Self-pay | Admitting: Hematology & Oncology

## 2015-06-10 DEATH — deceased

## 2016-03-13 ENCOUNTER — Encounter: Payer: Self-pay | Admitting: Internal Medicine

## 2016-04-02 IMAGING — CT CT CHEST W/ CM
2 of 5 series · 12 of 36 positions shown, 15 images · IV contrast (Omnipaque 300)
Comparison: 10/05/2013

CLINICAL DATA: Left lower lobe cancer.  Status post XRT

EXAM:
CT CHEST, ABDOMEN, AND PELVIS WITH CONTRAST
TECHNIQUE: Multidetector CT imaging of the chest, abdomen and pelvis was
performed following the standard protocol during bolus
administration of intravenous contrast.
CONTRAST:  100mL OMNIPAQUE IOHEXOL 300 MG/ML  SOLN

[Series 2: cap with 5.0 b40f · axial · 0.65mm/px · z∈[-621,-96]mm · 9 of 123 slices shown, 12 images]
[im 9/123  mediastinal]
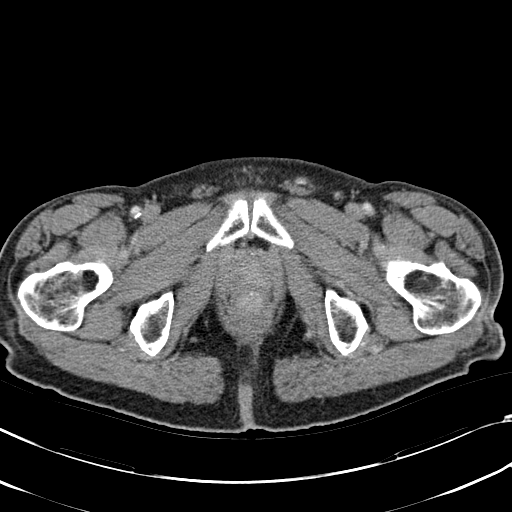
[im 9/123  lung]
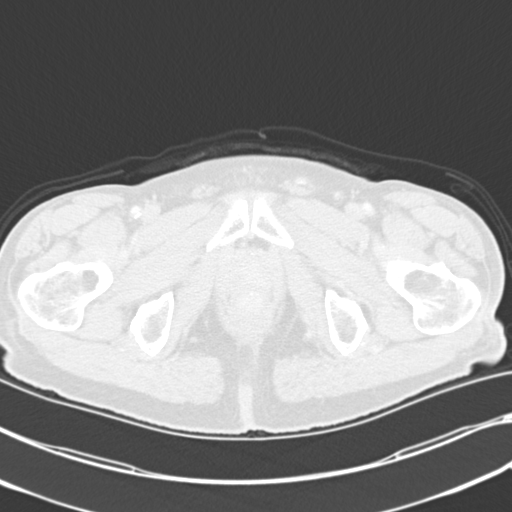
[im 27/123  lung]
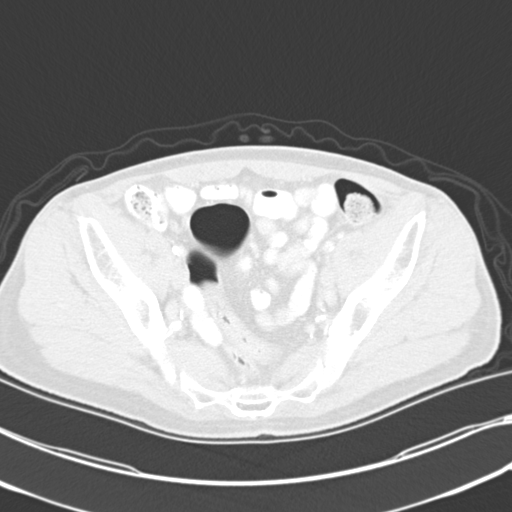
[im 35/123  lung]
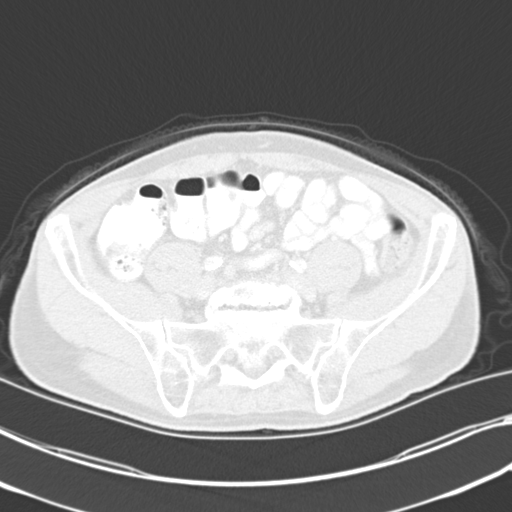
[im 53/123  lung]
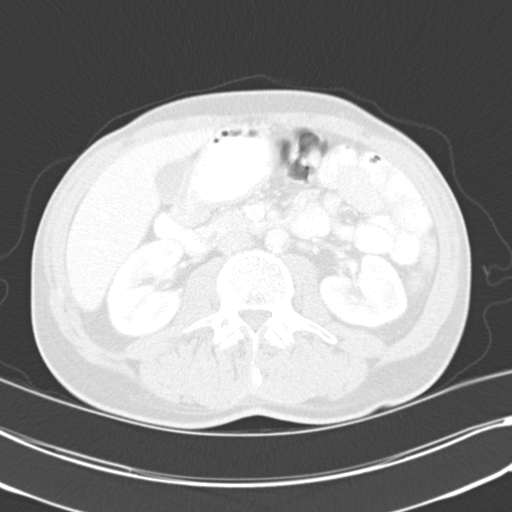
[im 61/123  mediastinal]
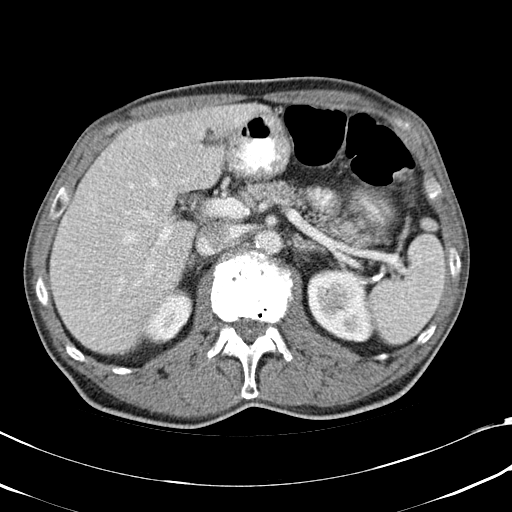
[im 61/123  lung]
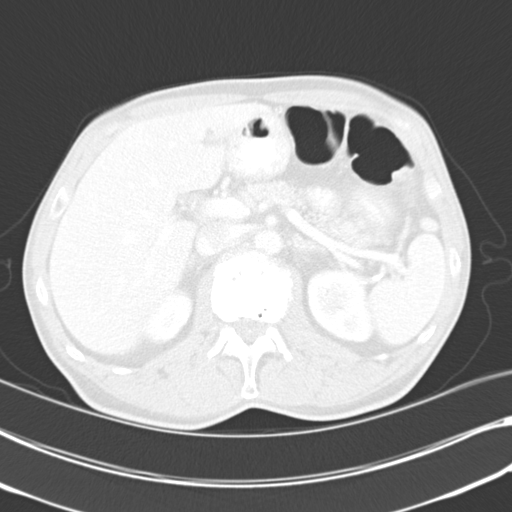
[im 70/123  lung]
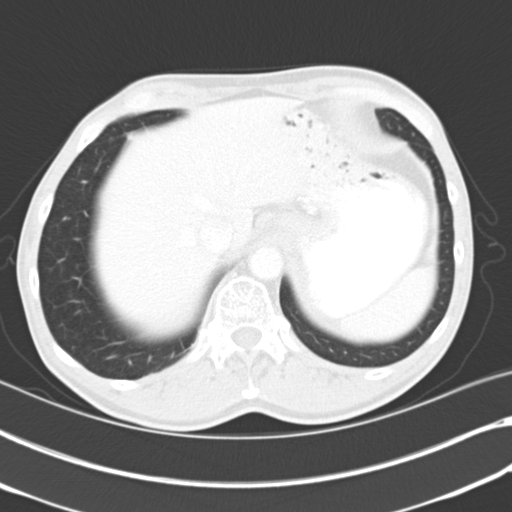
[im 88/123  lung]
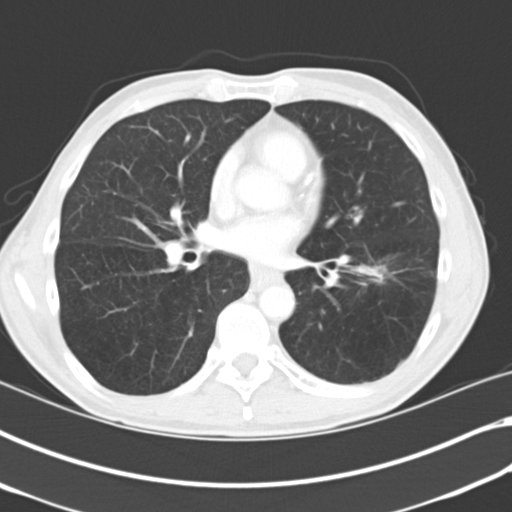
[im 96/123  lung]
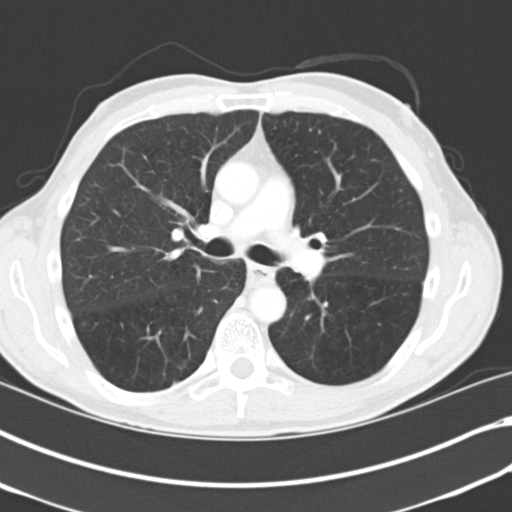
[im 114/123  mediastinal]
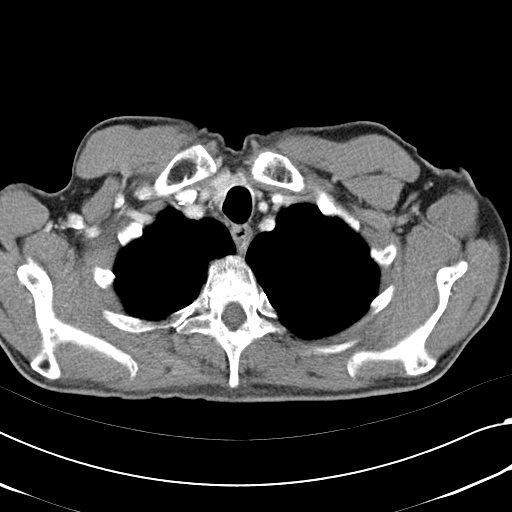
[im 114/123  lung]
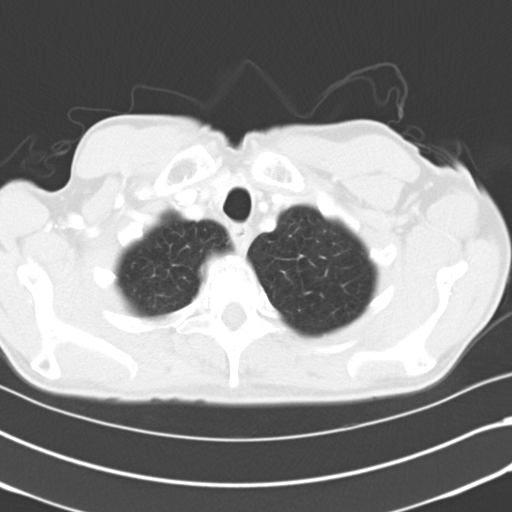

[Series 4: mpr cor post contrast (id) · coronal · 0.64mm/px · 3 of 74 slices shown]
[im 15/74  lung]
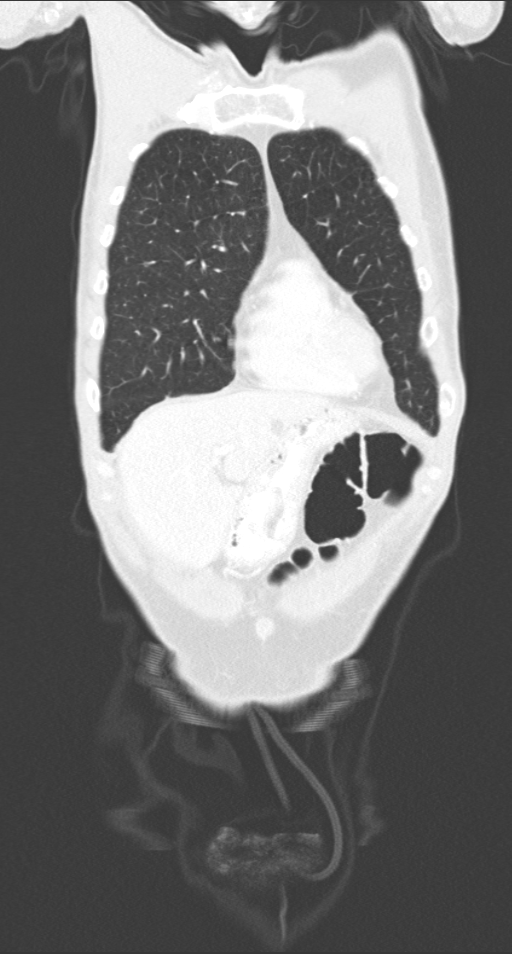
[im 30/74  lung]
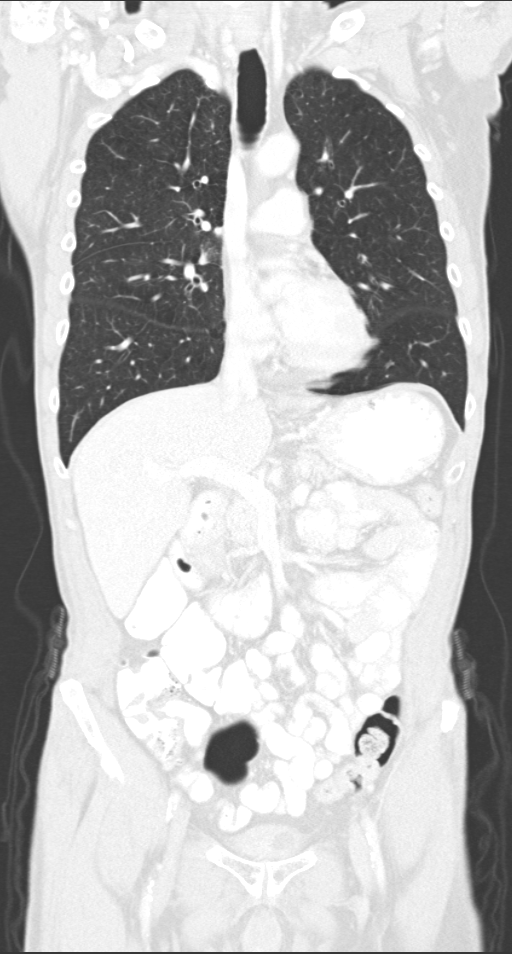
[im 44/74  lung]
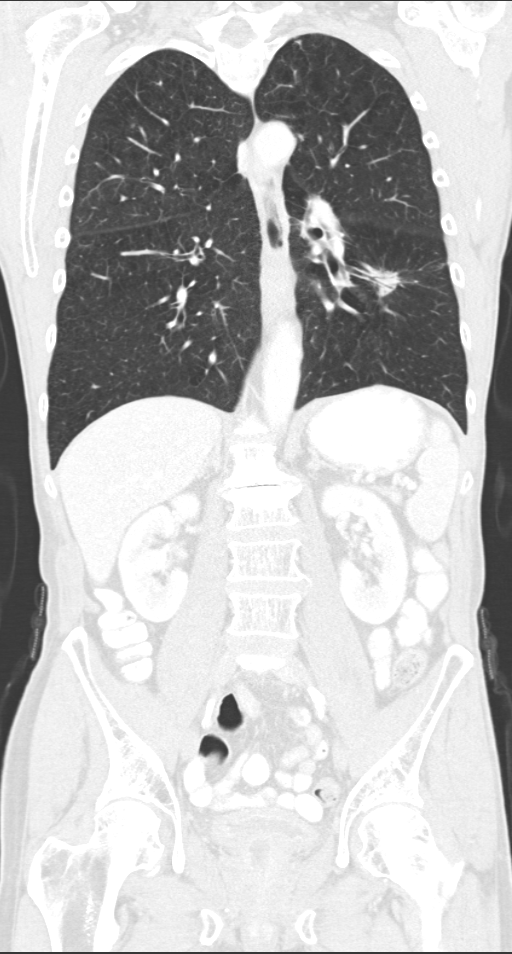

[12 of 36 positions shown; findings below may reference images not displayed]

FINDINGS: CT CHEST FINDINGS

Mediastinum: Normal heart size. There is no pericardial effusion.
Calcified atherosclerotic disease involves the thoracic aorta as
well as the LAD, RCA and left circumflex coronary arteries. There is
no mediastinal or hilar adenopathy. The trachea appears patent and
is midline. Normal appearance of the esophagus.

Lungs/Pleura: There is no pleural effusion identified. Moderate to
advanced changes of centrilobular emphysema identified. The index
lesion within the left lower lobe measures 1.9 cm, image 37/ series
2. Previously 2.8 cm. Stable subpleural nodule within the left upper
lobe measuring 4 mm, image 16/series 3. Left upper lobe nodule is
unchanged measuring 3 mm, image 22/series 3. No new or enlarging
pulmonary nodules identified.

Musculoskeletal: Review of the visualized osseous structures is
significant for mild thoracic degenerative disc disease. No
aggressive lytic or sclerotic bone lesions identified.

CT ABDOMEN AND PELVIS FINDINGS

Hepatobiliary: Low-attenuation structure in the lateral segment of
left lobe of liver is unchanged measuring 7 mm, image 56/series 2.
Also unchanged is a low attenuation structure in the medial segment
of left lobe of liver measuring 8 mm. The gallbladder appears
normal. There is no biliary dilatation.

Pancreas: Normal appearance of the pancreas.

Spleen: The spleen is normal.

Adrenals/Urinary Tract: Normal adrenal glands. Normal appearance of
the right kidney. Left renal calculi measures 9 mm, image 70/series
2. The urinary bladder appears thick walled.

Stomach/Bowel: The stomach is within normal limits. The small bowel
loops have a normal course and caliber. No obstruction. Normal
appearance of the colon.

Vascular/Lymphatic: Calcified atherosclerotic disease involves the
abdominal aorta. No aneurysm. No upper abdominal adenopathy. There
is no pelvic or inguinal adenopathy.

Reproductive: Enlarged prostate gland with postoperative changes
from TURP.

Other: There is no ascites or focal fluid collections within the
abdomen or pelvis.

Musculoskeletal: Review of the visualized osseous structures is
significant for degenerative disc disease at L1-2 and L5-S1. No
aggressive lytic or sclerotic bone lesion noted.
IMPRESSION: 1. No acute findings within the chest, abdomen or pelvis.
2. The left lung lesion has decreased in size from previous exam.
3. No new or progressive disease identified within the chest,
abdomen or pelvis.
4. Atherosclerotic disease including multi vessel coronary artery
calcifications
5. Bladder wall thickening suggests chronic bladder outlet
obstruction.
6. Enlarged prostate gland status post TURP.
7. Emphysema.
8. Nephrolithiasis.

## 2018-01-11 ENCOUNTER — Encounter (INDEPENDENT_AMBULATORY_CARE_PROVIDER_SITE_OTHER): Payer: Self-pay | Admitting: *Deleted
